# Patient Record
Sex: Male | Born: 1945
Health system: Southern US, Community
[De-identification: ages and names within clinical notes are randomized; demographics above are authoritative.]

## PROBLEM LIST (undated history)

## (undated) DIAGNOSIS — M1990 Unspecified osteoarthritis, unspecified site: Secondary | ICD-10-CM

## (undated) DIAGNOSIS — J189 Pneumonia, unspecified organism: Secondary | ICD-10-CM

## (undated) DIAGNOSIS — I10 Essential (primary) hypertension: Secondary | ICD-10-CM

## (undated) DIAGNOSIS — Z87442 Personal history of urinary calculi: Secondary | ICD-10-CM

## (undated) DIAGNOSIS — G4733 Obstructive sleep apnea (adult) (pediatric): Secondary | ICD-10-CM

## (undated) DIAGNOSIS — N433 Hydrocele, unspecified: Secondary | ICD-10-CM

## (undated) DIAGNOSIS — Z9989 Dependence on other enabling machines and devices: Secondary | ICD-10-CM

## (undated) DIAGNOSIS — R200 Anesthesia of skin: Secondary | ICD-10-CM

## (undated) DIAGNOSIS — E119 Type 2 diabetes mellitus without complications: Secondary | ICD-10-CM

## (undated) DIAGNOSIS — R202 Paresthesia of skin: Secondary | ICD-10-CM

## (undated) DIAGNOSIS — Z8709 Personal history of other diseases of the respiratory system: Secondary | ICD-10-CM

## (undated) DIAGNOSIS — F4310 Post-traumatic stress disorder, unspecified: Secondary | ICD-10-CM

## (undated) HISTORY — PX: CHOLECYSTECTOMY: SHX55

## (undated) HISTORY — PX: COLONOSCOPY: SHX174

## (undated) HISTORY — PX: TRIGGER FINGER RELEASE: SHX641

## (undated) HISTORY — PX: UPPER GI ENDOSCOPY: SHX6162

## (undated) HISTORY — PX: CERVICAL FUSION: SHX112

## (undated) HISTORY — PX: SHOULDER SURGERY: SHX246

## (undated) HISTORY — PX: EXTRACORPOREAL SHOCK WAVE LITHOTRIPSY: SHX1557

## (undated) HISTORY — PX: TONSILLECTOMY: SUR1361

## (undated) HISTORY — PX: OTHER SURGICAL HISTORY: SHX169

---

## 1989-05-20 HISTORY — PX: CHOLECYSTECTOMY: SHX55

## 1998-03-09 ENCOUNTER — Emergency Department (HOSPITAL_COMMUNITY): Admission: EM | Admit: 1998-03-09 | Discharge: 1998-03-09 | Payer: Self-pay | Admitting: Emergency Medicine

## 1998-05-29 ENCOUNTER — Inpatient Hospital Stay (HOSPITAL_COMMUNITY): Admission: RE | Admit: 1998-05-29 | Discharge: 1998-06-03 | Payer: Self-pay | Admitting: Urology

## 1998-05-29 ENCOUNTER — Encounter: Payer: Self-pay | Admitting: Urology

## 1998-06-01 ENCOUNTER — Encounter: Payer: Self-pay | Admitting: Urology

## 1998-06-02 ENCOUNTER — Encounter: Payer: Self-pay | Admitting: Urology

## 1998-06-08 ENCOUNTER — Encounter: Payer: Self-pay | Admitting: Urology

## 1998-06-08 ENCOUNTER — Inpatient Hospital Stay (HOSPITAL_COMMUNITY): Admission: EM | Admit: 1998-06-08 | Discharge: 1998-06-16 | Payer: Self-pay | Admitting: Emergency Medicine

## 1998-06-09 ENCOUNTER — Encounter: Payer: Self-pay | Admitting: Urology

## 1998-06-09 ENCOUNTER — Encounter: Payer: Self-pay | Admitting: Pulmonary Disease

## 1998-06-10 ENCOUNTER — Encounter: Payer: Self-pay | Admitting: Thoracic Surgery

## 1998-06-10 ENCOUNTER — Encounter: Payer: Self-pay | Admitting: Urology

## 1998-06-11 ENCOUNTER — Encounter: Payer: Self-pay | Admitting: Thoracic Surgery

## 1998-06-12 ENCOUNTER — Encounter: Payer: Self-pay | Admitting: Pulmonary Disease

## 1998-06-13 ENCOUNTER — Encounter: Payer: Self-pay | Admitting: Thoracic Surgery

## 1998-06-14 ENCOUNTER — Encounter: Payer: Self-pay | Admitting: Urology

## 1998-06-15 ENCOUNTER — Encounter: Payer: Self-pay | Admitting: Thoracic Surgery

## 1999-07-15 ENCOUNTER — Encounter: Payer: Self-pay | Admitting: Urology

## 1999-07-15 ENCOUNTER — Ambulatory Visit (HOSPITAL_COMMUNITY): Admission: RE | Admit: 1999-07-15 | Discharge: 1999-07-15 | Payer: Self-pay | Admitting: Urology

## 1999-08-16 ENCOUNTER — Encounter: Admission: RE | Admit: 1999-08-16 | Discharge: 1999-08-16 | Payer: Self-pay | Admitting: Urology

## 1999-08-16 ENCOUNTER — Encounter: Payer: Self-pay | Admitting: Urology

## 2000-04-04 ENCOUNTER — Encounter: Payer: Self-pay | Admitting: Urology

## 2000-04-04 ENCOUNTER — Encounter: Admission: RE | Admit: 2000-04-04 | Discharge: 2000-04-04 | Payer: Self-pay | Admitting: Urology

## 2000-09-19 HISTORY — PX: NEPHROLITHOTOMY: SHX5134

## 2000-10-09 ENCOUNTER — Encounter: Payer: Self-pay | Admitting: Family Medicine

## 2000-10-09 ENCOUNTER — Encounter: Admission: RE | Admit: 2000-10-09 | Discharge: 2000-10-09 | Payer: Self-pay | Admitting: Family Medicine

## 2001-04-30 ENCOUNTER — Encounter: Payer: Self-pay | Admitting: Urology

## 2001-04-30 ENCOUNTER — Encounter: Admission: RE | Admit: 2001-04-30 | Discharge: 2001-04-30 | Payer: Self-pay | Admitting: Urology

## 2002-06-12 ENCOUNTER — Encounter: Payer: Self-pay | Admitting: Neurosurgery

## 2002-06-17 ENCOUNTER — Encounter: Payer: Self-pay | Admitting: Neurosurgery

## 2002-06-17 ENCOUNTER — Ambulatory Visit (HOSPITAL_COMMUNITY): Admission: RE | Admit: 2002-06-17 | Discharge: 2002-06-18 | Payer: Self-pay | Admitting: Neurosurgery

## 2002-07-09 ENCOUNTER — Encounter: Payer: Self-pay | Admitting: Neurosurgery

## 2002-07-09 ENCOUNTER — Encounter: Admission: RE | Admit: 2002-07-09 | Discharge: 2002-07-09 | Payer: Self-pay | Admitting: Neurosurgery

## 2003-05-17 ENCOUNTER — Emergency Department (HOSPITAL_COMMUNITY): Admission: AD | Admit: 2003-05-17 | Discharge: 2003-05-17 | Payer: Self-pay | Admitting: Emergency Medicine

## 2003-05-18 ENCOUNTER — Encounter: Payer: Self-pay | Admitting: Emergency Medicine

## 2004-08-04 ENCOUNTER — Encounter: Admission: RE | Admit: 2004-08-04 | Discharge: 2004-08-04 | Payer: Self-pay | Admitting: Family Medicine

## 2004-08-25 ENCOUNTER — Encounter: Admission: RE | Admit: 2004-08-25 | Discharge: 2004-08-25 | Payer: Self-pay | Admitting: Family Medicine

## 2004-10-13 ENCOUNTER — Encounter: Admission: RE | Admit: 2004-10-13 | Discharge: 2004-10-13 | Payer: Self-pay | Admitting: Family Medicine

## 2005-07-07 ENCOUNTER — Encounter: Admission: RE | Admit: 2005-07-07 | Discharge: 2005-07-07 | Payer: Self-pay | Admitting: Family Medicine

## 2005-08-09 ENCOUNTER — Ambulatory Visit (HOSPITAL_BASED_OUTPATIENT_CLINIC_OR_DEPARTMENT_OTHER): Admission: RE | Admit: 2005-08-09 | Discharge: 2005-08-09 | Payer: Self-pay | Admitting: Family Medicine

## 2005-08-14 ENCOUNTER — Ambulatory Visit: Payer: Self-pay | Admitting: Internal Medicine

## 2005-09-13 ENCOUNTER — Ambulatory Visit (HOSPITAL_BASED_OUTPATIENT_CLINIC_OR_DEPARTMENT_OTHER): Admission: RE | Admit: 2005-09-13 | Discharge: 2005-09-13 | Payer: Self-pay | Admitting: Family Medicine

## 2005-09-18 ENCOUNTER — Ambulatory Visit: Payer: Self-pay | Admitting: Internal Medicine

## 2006-03-02 ENCOUNTER — Encounter: Admission: RE | Admit: 2006-03-02 | Discharge: 2006-03-02 | Payer: Self-pay | Admitting: Family Medicine

## 2006-03-21 ENCOUNTER — Encounter: Admission: RE | Admit: 2006-03-21 | Discharge: 2006-03-21 | Payer: Self-pay | Admitting: Neurology

## 2007-05-17 ENCOUNTER — Encounter: Admission: RE | Admit: 2007-05-17 | Discharge: 2007-05-17 | Payer: Self-pay | Admitting: Orthopedic Surgery

## 2007-06-08 ENCOUNTER — Encounter: Admission: RE | Admit: 2007-06-08 | Discharge: 2007-06-08 | Payer: Self-pay | Admitting: Orthopedic Surgery

## 2007-06-29 ENCOUNTER — Encounter: Admission: RE | Admit: 2007-06-29 | Discharge: 2007-06-29 | Payer: Self-pay | Admitting: Orthopedic Surgery

## 2007-08-27 ENCOUNTER — Ambulatory Visit (HOSPITAL_COMMUNITY): Admission: RE | Admit: 2007-08-27 | Discharge: 2007-08-27 | Payer: Self-pay | Admitting: Urology

## 2008-06-12 ENCOUNTER — Encounter: Admission: RE | Admit: 2008-06-12 | Discharge: 2008-06-12 | Payer: Self-pay | Admitting: Neurosurgery

## 2009-02-12 ENCOUNTER — Encounter: Admission: RE | Admit: 2009-02-12 | Discharge: 2009-02-12 | Payer: Self-pay | Admitting: Family Medicine

## 2010-09-03 ENCOUNTER — Encounter
Admission: RE | Admit: 2010-09-03 | Discharge: 2010-09-03 | Payer: Self-pay | Source: Home / Self Care | Attending: Orthopedic Surgery | Admitting: Orthopedic Surgery

## 2010-09-16 ENCOUNTER — Ambulatory Visit
Admission: RE | Admit: 2010-09-16 | Discharge: 2010-09-16 | Payer: Self-pay | Source: Home / Self Care | Attending: Orthopedic Surgery | Admitting: Orthopedic Surgery

## 2010-09-16 HISTORY — PX: SHOULDER ARTHROSCOPY: SHX128

## 2010-10-24 ENCOUNTER — Inpatient Hospital Stay (INDEPENDENT_AMBULATORY_CARE_PROVIDER_SITE_OTHER)
Admission: RE | Admit: 2010-10-24 | Discharge: 2010-10-24 | Disposition: A | Discharge status: Home / Self Care | Payer: BC Managed Care – PPO | Source: Ambulatory Visit | Attending: Family Medicine | Admitting: Family Medicine

## 2010-10-24 DIAGNOSIS — M25519 Pain in unspecified shoulder: Secondary | ICD-10-CM

## 2010-11-29 LAB — BASIC METABOLIC PANEL
BUN: 12 mg/dL (ref 6–23)
Chloride: 103 mEq/L (ref 96–112)
Creatinine, Ser: 1.2 mg/dL (ref 0.4–1.5)
GFR calc Af Amer: >60 mL/min (ref >60)
Glucose, Bld: 91 mg/dL (ref 70–99)
Potassium: 4.8 mEq/L (ref 3.5–5.1)
Sodium: 141 mEq/L (ref 135–145)

## 2010-11-29 LAB — POCT HEMOGLOBIN-HEMACUE: Hemoglobin: 15.2 g/dL (ref 13.0–17.0)

## 2011-02-04 NOTE — Op Note (Signed)
NAME:  MAXXWELL, EDGETT                        ACCOUNT NO.:  [id number]   MEDICAL RECORD NO.:  [id number]                   PATIENT TYPE:  OIB   LOCATION:  NA                                   FACILITY:  [facility]   PHYSICIAN:  Reinaldo Meeker, M.D.              DATE OF BIRTH:  07-Dec-1945   DATE OF PROCEDURE:  06/17/2002  DATE OF DISCHARGE:                                 OPERATIVE REPORT   PREOPERATIVE DIAGNOSIS:  Herniated disk at C5-6 level.   POSTOPERATIVE DIAGNOSIS:  Herniated disk at C5-6 level.   PROCEDURE:  C5-6 anterior cervical diskectomy with bone bank fusion,  followed by Zephyr anterior cervical plating, with the operating microscope.   SURGEON:  Reinaldo Meeker, M.D.   ASSISTANT:  Kathaleen Maser. Pool, M.D.   DESCRIPTION OF PROCEDURE:  After being placed in the supine position in 10  pounds of Holter traction, the patient's neck was prepped and draped in the  usual sterile fashion.  A localizing x-ray was taken prior to incision to  identify the appropriate level.  A transverse incision was made in the right  anterior neck starting at the midline and headed toward the medial aspect of  the sternocleidomastoid muscle.  The platysma muscle was then incised  transversely.  The natural fascial plane between the strap muscles medially  and the sternocleidomastoid laterally was identified and followed down to  the anterior aspect of the cervical spine.  The longus colli muscles were  identified and split in the midline and stripped away bilaterally with the  Optician, dispensing.  A self-retaining retractor was placed  for exposure.  A second x-ray showed approach to the C5-6 level.  Using a 15  blade, the annulus of the disk was incised.  Using pituitary rongeurs and  curettes, approximately 90% of the disk material was removed.  A high-speed  drill was used to decorticate the edges and widen the disk space.  At this  point the microscope was draped and brought  into the field and used for the  remainder of the case.  Using microdissection technique, the remainder of  the disk material down to the posterior longitudinal ligament was removed.  The ligament was then incised transversely and the cut edge removed with the  Kerrison punch.  Inspection of the left C5-6 foramen yielded a large amount  of herniated disk material pressing on the spinal dura and proximal nerve  root, and this was removed.  This gave excellent decompression of the  underlying C6 nerve root.  At this point inspection was carried out  bilaterally to complete the decompression, and no further compression could  be identified at that time.  Large amounts of irrigation were carried out  and any bleeders controlled with bipolar coagulation and Gelfoam.  Measurements were taken and a 7 mm bone bank plug was reconstituted.  After  irrigating once more, the  7 mm plug was impacted and fluoroscopy showed it  to be in good position.  The appropriate-length Zephyr anterior cervical  plate was then chosen.  Under fluoroscopic guidance drill holes placed,  followed by placement of 15 mm screws x4.  Large amounts of irrigation were  carried out at this time and any bleeding controlled with bipolar  coagulation.  The wound was then closed using interrupted Vicryl on the  platysma muscle, inverted 5-0 PDS on the subcuticular layer, and Steri-  Strips on the skin.  A sterile dressing was then applied and the patient was  extubated and taken to the recovery room in stable condition.                                               Reinaldo Meeker, M.D.   ROK/MEDQ  D:  06/17/2002  T:  06/18/2002  Job:  [id number]

## 2011-02-04 NOTE — Procedures (Signed)
NAME:  Seth Lewis, Seth Lewis NO.:  [id number]   MEDICAL RECORD NO.:  [id number]          PATIENT TYPE:  OUT   LOCATION:  SLEEP CENTER                 FACILITY:  [facility]   PHYSICIAN:  Clinton D. Maple Hudson, M.D. DATE OF BIRTH:  1946/03/13   DATE OF STUDY:                              NOCTURNAL POLYSOMNOGRAM   REFERRING PHYSICIAN:  Dr. Blair Heys.   DATE OF STUDY:  September 13, 2005.   INDICATION FOR STUDY:  Hypersomnia with sleep apnea.   EPWORTH SLEEPINESS SCORE:  9/24.   BMI:  29.   WEIGHT:  225 pounds.   HOME MEDICATIONS:  Toprol XL, simvastatin, Nifedical, aspirin, temazepam.   SLEEP ARCHITECTURE:  Total sleep time 351 minutes with sleep efficiency 97%.  Stage I 3%, stage II 58%, stages III and IV 13%, REM 26% of total sleep  time. Sleep latency 8 minutes, REM latency 58 minutes, awake after sleep  onset 4.5 minutes, arousal index 21.7. An unspecified sleeping pill was  taken before arrival.   RESPIRATORY DATA:  C-PAP titration protocol:  C-PAP was successfully  titrated to 8 CWP, AHI 1.1 per hour. A ResMed Swift was used with medium  nasal pillows, and a heated humidifier.   OXYGEN DATA:  Loud snoring before C-PAP control was prevented by C-PAP. An  oxygen saturation on C-PAP was held 95-98% on room air.   CARDIAC DATA:  Normal sinus rhythm.   MOVEMENT/PARASOMNIA:  A total of 68 limb jerks were reported of which 28  were associated with arousal or awakening for periodic limb movement with  arousal index of 4.8 per hour which is mildly increased.   IMPRESSION/RECOMMENDATIONS:  1.  Successful C-PAP titration to 8 CWP, AHI 1.1 per hour. A sleeping pill      had been taken. A ResMed Swift with medium nasal pillows was used with      heated humidifier.  2.  Baseline diagnostic NPSG on August 09, 2005 had reported an AHI of      25.6 per hour.  3.  Periodic limb movement with arousal, 4.8 per hour which can be      reconsidered clinically after adjustment  to C-PAP.      Clinton D. Maple Hudson, M.D.  Diplomate, Biomedical engineer of Sleep Medicine  Electronically Signed     CDY/MEDQ  D:  09/18/2005 09:24:04  T:  09/18/2005 15:47:23  Job:  [id number]

## 2011-02-04 NOTE — Procedures (Signed)
NAME:  Seth Lewis, VICTORY NO.:  [id number]   MEDICAL RECORD NO.:  [id number]          PATIENT TYPE:  OUT   LOCATION:  SLEEP CENTER                 FACILITY:  [facility]   PHYSICIAN:  Clinton D. Maple Hudson, M.D. DATE OF BIRTH:  May 24, 1946   DATE OF STUDY:  08/09/2005                              NOCTURNAL POLYSOMNOGRAM   REFERRING PHYSICIAN:  Dr. Blair Heys.   DATE OF STUDY:  August 09, 2005.   INDICATION FOR STUDY:  Hypersomnia with sleep apnea.   EPWORTH SLEEPINESS SCORE:  7/24.   BMI:  29.   WEIGHT:  225 pounds.   SLEEP ARCHITECTURE:  Total sleep time 239 minutes with sleep efficiency 59%.  Sleep onset was at 1:24 a.m. Stage I was 8%, stage II 59%, stages III and IV  9%, REM 24% of total sleep time. Sleep latency 132 minutes, REM latency 110  minutes, awake after sleep onset 37 minutes, arousal index 37.3. No bedtime  medication was taken. The patient indicated that he was bothered by the  monitoring leads.   RESPIRATORY DATA:  Apnea/hypopnea index (AHI, RDI) 25.6 obstructive events  per hour indicating moderate obstructive sleep apnea/hypopnea syndrome.  There were 32 obstructive apneas and 70 hypopneas. Events were more common  while supine but present also while sleeping on right side. REM AHI 51 per  hour. Sleep onset was too late to permit use of C-PAP titration by split  protocol.   OXYGEN DATA:  Very loud snoring with oxygen desaturation to a nadir of 79%.  Mean oxygen saturation through the study was 93% on room air.   CARDIAC DATA:  Normal sinus rhythm.   MOVEMENT/PARASOMNIA:  A total of 44 limb jerks were reported of which 13  were associated with arousal or awakening for periodic limb movement with  arousal index of 3.3 per hour which is mildly increased.   IMPRESSION/RECOMMENDATIONS:  1.  Difficulty initiating and maintaining sleep on the study night.  2.  Moderate obstructive sleep apnea/hypopnea syndrome, AHI 25.6 per hour      with loud  snoring and oxygen desaturation to 79%. Events were more      common while supine and during REM.  3.  Consider return for C-PAP titration bringing a sleep medication to      assist with sleep onset and maintenance.  4.  Mild periodic limb movement with arousal, 3.3 per hour.      Clinton D. Maple Hudson, M.D.  Diplomate, Biomedical engineer of Sleep Medicine  Electronically Signed     CDY/MEDQ  D:  08/14/2005 11:24:47  T:  08/14/2005 23:37:36  Job:  [id number]

## 2011-08-18 ENCOUNTER — Other Ambulatory Visit: Payer: Self-pay | Admitting: Family Medicine

## 2011-08-18 DIAGNOSIS — M949 Disorder of cartilage, unspecified: Secondary | ICD-10-CM

## 2011-08-18 DIAGNOSIS — M899 Disorder of bone, unspecified: Secondary | ICD-10-CM

## 2011-08-19 ENCOUNTER — Ambulatory Visit
Admission: RE | Admit: 2011-08-19 | Discharge: 2011-08-19 | Disposition: A | Discharge status: Home / Self Care | Payer: BC Managed Care – PPO | Source: Ambulatory Visit | Attending: Family Medicine | Admitting: Family Medicine

## 2011-08-19 DIAGNOSIS — M899 Disorder of bone, unspecified: Secondary | ICD-10-CM

## 2012-09-28 ENCOUNTER — Other Ambulatory Visit: Payer: Self-pay | Admitting: Neurosurgery

## 2012-09-28 DIAGNOSIS — M542 Cervicalgia: Secondary | ICD-10-CM

## 2012-10-08 ENCOUNTER — Ambulatory Visit
Admission: RE | Admit: 2012-10-08 | Discharge: 2012-10-08 | Disposition: A | Discharge status: Home / Self Care | Payer: BC Managed Care – PPO | Source: Ambulatory Visit | Attending: Neurosurgery | Admitting: Neurosurgery

## 2012-10-08 VITALS — BP 119/69 | HR 62 | Ht 73.0 in | Wt 223.0 lb

## 2012-10-08 DIAGNOSIS — M542 Cervicalgia: Secondary | ICD-10-CM

## 2012-10-08 MED ORDER — DIAZEPAM 5 MG PO TABS
5.0000 mg | ORAL_TABLET | Freq: Once | ORAL | Status: AC
  Administered 2012-10-08: 5 mg via ORAL

## 2012-10-08 MED ORDER — IOHEXOL 300 MG/ML  SOLN
10.0000 mL | Freq: Once | INTRAMUSCULAR | Status: AC | PRN
  Administered 2012-10-08: 10 mL via INTRATHECAL

## 2012-10-08 MED ORDER — MEPERIDINE HCL 100 MG/ML IJ SOLN
75.0000 mg | Freq: Once | INTRAMUSCULAR | Status: AC
  Administered 2012-10-08: 75 mg via INTRAMUSCULAR

## 2012-10-08 MED ORDER — ONDANSETRON HCL 4 MG/2ML IJ SOLN
4.0000 mg | Freq: Once | INTRAMUSCULAR | Status: AC
  Administered 2012-10-08: 4 mg via INTRAMUSCULAR

## 2012-10-10 ENCOUNTER — Telehealth: Payer: Self-pay | Admitting: Radiology

## 2012-10-10 NOTE — Telephone Encounter (Signed)
Pt called because low back is sore and uncomfortable after myelo, on Monday. Explained, he had been stuck with a needle and going through all that muscle could make it sore. Also explained that sometimes just the fact of being on bedrest for 24-48 hours could definitely make him more stiff and sore.

## 2013-01-27 ENCOUNTER — Emergency Department (INDEPENDENT_AMBULATORY_CARE_PROVIDER_SITE_OTHER)
Admission: EM | Admit: 2013-01-27 | Discharge: 2013-01-27 | Disposition: A | Discharge status: Home / Self Care | Payer: BC Managed Care – PPO | Source: Home / Self Care | Attending: Emergency Medicine | Admitting: Emergency Medicine

## 2013-01-27 ENCOUNTER — Emergency Department (INDEPENDENT_AMBULATORY_CARE_PROVIDER_SITE_OTHER): Payer: BC Managed Care – PPO

## 2013-01-27 ENCOUNTER — Encounter (HOSPITAL_COMMUNITY): Payer: Self-pay | Admitting: *Deleted

## 2013-01-27 DIAGNOSIS — J209 Acute bronchitis, unspecified: Secondary | ICD-10-CM

## 2013-01-27 MED ORDER — CEFTRIAXONE SODIUM 1 G IJ SOLR
1.0000 g | Freq: Once | INTRAMUSCULAR | Status: AC
  Administered 2013-01-27: 1 g via INTRAMUSCULAR

## 2013-01-27 MED ORDER — GUAIFENESIN-CODEINE 100-10 MG/5ML PO SYRP: 10.0000 mL | ORAL_SOLUTION | Freq: Four times a day (QID) | ORAL | Status: DC | PRN

## 2013-01-27 MED ORDER — LIDOCAINE HCL (PF) 1 % IJ SOLN
INTRAMUSCULAR | Status: AC
  Filled 2013-01-27: qty 5

## 2013-01-27 MED ORDER — ALBUTEROL SULFATE HFA 108 (90 BASE) MCG/ACT IN AERS: 2.0000 | INHALATION_SPRAY | RESPIRATORY_TRACT | Status: DC | PRN

## 2013-01-27 MED ORDER — LEVOFLOXACIN 500 MG PO TABS: 500.0000 mg | ORAL_TABLET | Freq: Every day | ORAL | Status: DC

## 2013-01-27 MED ORDER — CEFTRIAXONE SODIUM 1 G IJ SOLR
INTRAMUSCULAR | Status: AC
  Filled 2013-01-27: qty 10

## 2013-01-27 NOTE — ED Provider Notes (Signed)
History     CSN: [id number]  Arrival date & time 01/27/13  1101   First MD Initiated Contact with Patient 01/27/13 1109      Chief Complaint  Patient presents with  . URI    (Consider location/radiation/quality/duration/timing/severity/associated sxs/prior treatment) Patient is a 67 y.o. male presenting with URI. The history is provided by the patient. No language interpreter was used.  URI Presenting symptoms: congestion, cough and fever   Severity:  Moderate Onset quality:  Gradual Duration:  1 week Timing:  Intermittent Progression:  Waxing and waning Chronicity:  New Relieved by:  Nothing Worsened by:  Breathing Ineffective treatments:  None tried Associated symptoms: myalgias and swollen glands   Risk factors: being elderly     History reviewed. No pertinent past medical history.  History reviewed. No pertinent past surgical history.  No family history on file.  History  Substance Use Topics  . Smoking status: Former Smoker -- 0.30 packs/day for 12 years    Types: Cigarettes    Quit date: 10/08/1982  . Smokeless tobacco: Never Used  . Alcohol Use: Yes     Comment: occasionally      Review of Systems  Constitutional: Positive for fever.  HENT: Positive for congestion.   Eyes: Negative.   Respiratory: Positive for cough.   Cardiovascular: Negative.   Gastrointestinal: Negative.   Endocrine: Negative.   Genitourinary: Negative.   Musculoskeletal: Positive for myalgias.  Neurological: Negative.   Hematological: Negative.   Psychiatric/Behavioral: Negative.   All other systems reviewed and are negative.    Allergies  Review of patient's allergies indicates no known allergies.  Home Medications   Current Outpatient Rx  Name  Route  Sig  Dispense  Refill  . aspirin 81 MG tablet   Oral   Take 81 mg by mouth daily.         . ergocalciferol (VITAMIN D2) 50000 UNITS capsule   Oral   Take 50,000 Units by mouth once a week.         .  metoprolol succinate (TOPROL-XL) 50 MG 24 hr tablet   Oral   Take 50 mg by mouth daily. Take with or immediately following a meal.         . NIFEdipine (PROCARDIA XL/ADALAT-CC) 60 MG 24 hr tablet   Oral   Take 60 mg by mouth daily.         . simvastatin (ZOCOR) 40 MG tablet   Oral   Take 40 mg by mouth every evening.         . testosterone (ANDROGEL) 50 MG/5GM GEL   Transdermal   Place 5 g onto the skin daily.         Marland Kitchen albuterol (PROVENTIL HFA;VENTOLIN HFA) 108 (90 BASE) MCG/ACT inhaler   Inhalation   Inhale 2 puffs into the lungs every 4 (four) hours as needed for wheezing.   1 Inhaler   0   . guaiFENesin-codeine (ROBITUSSIN AC) 100-10 MG/5ML syrup   Oral   Take 10 mLs by mouth 4 (four) times daily as needed for cough.   150 mL   0   . levofloxacin (LEVAQUIN) 500 MG tablet   Oral   Take 1 tablet (500 mg total) by mouth daily.   7 tablet   0     BP 126/73  Pulse 69  Temp(Src) 99.7 F (37.6 C) (Oral)  Resp 16  SpO2 96%  Physical Exam  Nursing note and vitals reviewed. Constitutional: He is oriented to  person, place, and time. He appears well-developed and well-nourished. No distress.  HENT:  Head: Normocephalic and atraumatic.  Throat hyperemic, no exudate  Eyes: Conjunctivae are normal. Pupils are equal, round, and reactive to light.  Neck: Normal range of motion. Neck supple.  Has anterior cervical adenopathy  Cardiovascular: Normal rate, regular rhythm, normal heart sounds and intact distal pulses.   No murmur heard. Pulmonary/Chest: Effort normal and breath sounds normal. No respiratory distress. He has no wheezes. He has no rales. He exhibits no tenderness.  Abdominal: Soft. Bowel sounds are normal. He exhibits no distension and no mass. There is no tenderness.  Musculoskeletal: Normal range of motion.  Lymphadenopathy:    He has cervical adenopathy.  Neurological: He is alert and oriented to person, place, and time. No cranial nerve deficit. He  exhibits normal muscle tone. Coordination normal.  Skin: Skin is warm and dry.  Psychiatric: He has a normal mood and affect.    ED Course  Procedures (including critical care time)  Labs Reviewed - No data to display Dg Chest 2 View  01/27/2013  *RADIOLOGY REPORT*  Clinical Data: Cough and fever  CHEST - 2 VIEW  Comparison: 10/13/2004  Findings: Cervical fusion hardware partly visualized.  Heart size is normal. Aorta is ectatic and unfolded.  Curvilinear left lower lobe probable scarring or atelectasis.  Right pleural fluid or thickening noted at the costophrenic angle, unchanged. Flattening of hemidiaphragms again noted suggesting emphysema.  IMPRESSION: Emphysematous changes again noted, no acute finding.   Original Report Authenticated By: Christiana Pellant, M.D.      1. Acute bronchitis       MDM         Jani Files, MD 01/28/13 [phone]

## 2013-01-27 NOTE — ED Notes (Signed)
Patient complains of coughing, fever, fatigue x 1 week. Shortness of breath and can not breath when he lies down. Also, diarrhea, nausea and vomiting.

## 2015-06-23 DIAGNOSIS — M25511 Pain in right shoulder: Secondary | ICD-10-CM | POA: Diagnosis not present

## 2015-06-29 DIAGNOSIS — M19011 Primary osteoarthritis, right shoulder: Secondary | ICD-10-CM | POA: Diagnosis not present

## 2015-06-29 DIAGNOSIS — M24111 Other articular cartilage disorders, right shoulder: Secondary | ICD-10-CM | POA: Diagnosis not present

## 2015-07-15 DIAGNOSIS — M19011 Primary osteoarthritis, right shoulder: Secondary | ICD-10-CM | POA: Diagnosis not present

## 2015-07-15 DIAGNOSIS — M6281 Muscle weakness (generalized): Secondary | ICD-10-CM | POA: Diagnosis not present

## 2015-07-15 DIAGNOSIS — M25511 Pain in right shoulder: Secondary | ICD-10-CM | POA: Diagnosis not present

## 2015-07-15 DIAGNOSIS — Z96611 Presence of right artificial shoulder joint: Secondary | ICD-10-CM | POA: Diagnosis not present

## 2015-07-20 DIAGNOSIS — M25511 Pain in right shoulder: Secondary | ICD-10-CM | POA: Diagnosis not present

## 2015-07-20 DIAGNOSIS — M19011 Primary osteoarthritis, right shoulder: Secondary | ICD-10-CM | POA: Diagnosis not present

## 2015-07-20 DIAGNOSIS — Z96611 Presence of right artificial shoulder joint: Secondary | ICD-10-CM | POA: Diagnosis not present

## 2015-07-20 DIAGNOSIS — M6281 Muscle weakness (generalized): Secondary | ICD-10-CM | POA: Diagnosis not present

## 2015-07-21 DIAGNOSIS — Z96611 Presence of right artificial shoulder joint: Secondary | ICD-10-CM | POA: Diagnosis not present

## 2015-07-21 DIAGNOSIS — M19011 Primary osteoarthritis, right shoulder: Secondary | ICD-10-CM | POA: Diagnosis not present

## 2015-07-21 DIAGNOSIS — M25511 Pain in right shoulder: Secondary | ICD-10-CM | POA: Diagnosis not present

## 2015-07-21 DIAGNOSIS — M6281 Muscle weakness (generalized): Secondary | ICD-10-CM | POA: Diagnosis not present

## 2015-07-22 DIAGNOSIS — M6281 Muscle weakness (generalized): Secondary | ICD-10-CM | POA: Diagnosis not present

## 2015-07-22 DIAGNOSIS — M19011 Primary osteoarthritis, right shoulder: Secondary | ICD-10-CM | POA: Diagnosis not present

## 2015-07-22 DIAGNOSIS — M25511 Pain in right shoulder: Secondary | ICD-10-CM | POA: Diagnosis not present

## 2015-07-22 DIAGNOSIS — Z96611 Presence of right artificial shoulder joint: Secondary | ICD-10-CM | POA: Diagnosis not present

## 2015-09-12 ENCOUNTER — Emergency Department (HOSPITAL_COMMUNITY): Payer: BLUE CROSS/BLUE SHIELD

## 2015-09-12 ENCOUNTER — Emergency Department (HOSPITAL_COMMUNITY)
Admission: EM | Admit: 2015-09-12 | Discharge: 2015-09-12 | Disposition: A | Discharge status: Home / Self Care | Payer: BLUE CROSS/BLUE SHIELD | Attending: Emergency Medicine | Admitting: Emergency Medicine

## 2015-09-12 ENCOUNTER — Encounter (HOSPITAL_COMMUNITY): Payer: Self-pay | Admitting: Emergency Medicine

## 2015-09-12 DIAGNOSIS — Z87891 Personal history of nicotine dependence: Secondary | ICD-10-CM | POA: Insufficient documentation

## 2015-09-12 DIAGNOSIS — Z792 Long term (current) use of antibiotics: Secondary | ICD-10-CM | POA: Insufficient documentation

## 2015-09-12 DIAGNOSIS — R1032 Left lower quadrant pain: Secondary | ICD-10-CM | POA: Diagnosis not present

## 2015-09-12 DIAGNOSIS — Z8669 Personal history of other diseases of the nervous system and sense organs: Secondary | ICD-10-CM | POA: Diagnosis not present

## 2015-09-12 DIAGNOSIS — K5900 Constipation, unspecified: Secondary | ICD-10-CM | POA: Diagnosis not present

## 2015-09-12 DIAGNOSIS — Z79899 Other long term (current) drug therapy: Secondary | ICD-10-CM | POA: Diagnosis not present

## 2015-09-12 DIAGNOSIS — R1031 Right lower quadrant pain: Secondary | ICD-10-CM | POA: Diagnosis not present

## 2015-09-12 DIAGNOSIS — E119 Type 2 diabetes mellitus without complications: Secondary | ICD-10-CM | POA: Diagnosis not present

## 2015-09-12 DIAGNOSIS — Z7982 Long term (current) use of aspirin: Secondary | ICD-10-CM | POA: Insufficient documentation

## 2015-09-12 DIAGNOSIS — Z7984 Long term (current) use of oral hypoglycemic drugs: Secondary | ICD-10-CM | POA: Diagnosis not present

## 2015-09-12 DIAGNOSIS — N200 Calculus of kidney: Secondary | ICD-10-CM | POA: Diagnosis not present

## 2015-09-12 DIAGNOSIS — R1030 Lower abdominal pain, unspecified: Secondary | ICD-10-CM

## 2015-09-12 LAB — COMPREHENSIVE METABOLIC PANEL
ALK PHOS: 99 U/L (ref 38–126)
ALT: 27 U/L (ref 17–63)
ANION GAP: 13 (ref 5–15)
AST: 37 U/L (ref 15–41)
Albumin: 3.3 g/dL — ABNORMAL LOW (ref 3.5–5.0)
BILIRUBIN TOTAL: 2.1 mg/dL — AB (ref 0.3–1.2)
BUN: 15 mg/dL (ref 6–20)
CALCIUM: 8.6 mg/dL — AB (ref 8.9–10.3)
CO2: 19 mmol/L — ABNORMAL LOW (ref 22–32)
CREATININE: 1.4 mg/dL — AB (ref 0.61–1.24)
Chloride: 103 mmol/L (ref 101–111)
GFR, EST AFRICAN AMERICAN: 57 mL/min — AB (ref >60)
GFR, EST NON AFRICAN AMERICAN: 49 mL/min — AB (ref >60)
Glucose, Bld: 145 mg/dL — ABNORMAL HIGH (ref 65–99)
Potassium: 4.7 mmol/L (ref 3.5–5.1)
SODIUM: 135 mmol/L (ref 135–145)
TOTAL PROTEIN: 7.8 g/dL (ref 6.5–8.1)

## 2015-09-12 LAB — URINE MICROSCOPIC-ADD ON

## 2015-09-12 LAB — CBC
HCT: 37.1 % — ABNORMAL LOW (ref 39.0–52.0)
HEMOGLOBIN: 12.1 g/dL — AB (ref 13.0–17.0)
MCH: 29.6 pg (ref 26.0–34.0)
MCHC: 32.6 g/dL (ref 30.0–36.0)
MCV: 90.7 fL (ref 78.0–100.0)
PLATELETS: 367 10*3/uL (ref 150–400)
RBC: 4.09 MIL/uL — AB (ref 4.22–5.81)
RDW: 14.1 % (ref 11.5–15.5)
WBC: 12.5 10*3/uL — AB (ref 4.0–10.5)

## 2015-09-12 LAB — URINALYSIS, ROUTINE W REFLEX MICROSCOPIC
Bilirubin Urine: NEGATIVE
Glucose, UA: NEGATIVE mg/dL
Ketones, ur: NEGATIVE mg/dL
NITRITE: NEGATIVE
PROTEIN: NEGATIVE mg/dL
SPECIFIC GRAVITY, URINE: 1.011 (ref 1.005–1.030)
pH: 6 (ref 5.0–8.0)

## 2015-09-12 LAB — LIPASE, BLOOD: Lipase: 35 U/L (ref 11–51)

## 2015-09-12 LAB — CBG MONITORING, ED: GLUCOSE-CAPILLARY: 141 mg/dL — AB (ref 65–99)

## 2015-09-12 MED ORDER — SODIUM CHLORIDE 0.9 % IV BOLUS (SEPSIS)
1000.0000 mL | Freq: Once | INTRAVENOUS | Status: AC
  Administered 2015-09-12: 1000 mL via INTRAVENOUS

## 2015-09-12 MED ORDER — POLYETHYLENE GLYCOL 3350 17 GM/SCOOP PO POWD: 17.0000 g | Freq: Two times a day (BID) | ORAL | Status: DC

## 2015-09-12 MED ORDER — OMEPRAZOLE 20 MG PO CPDR
20.0000 mg | DELAYED_RELEASE_CAPSULE | Freq: Every day | ORAL | Status: DC
Start: 2015-09-12 — End: 2015-12-11

## 2015-09-12 MED ORDER — TAMSULOSIN HCL 0.4 MG PO CAPS: 0.4000 mg | ORAL_CAPSULE | Freq: Every day | ORAL | Status: DC

## 2015-09-12 MED ORDER — MORPHINE SULFATE (PF) 4 MG/ML IV SOLN
4.0000 mg | Freq: Once | INTRAVENOUS | Status: AC
  Administered 2015-09-12: 4 mg via INTRAVENOUS
  Filled 2015-09-12: qty 1

## 2015-09-12 MED ORDER — IOHEXOL 300 MG/ML  SOLN
80.0000 mL | Freq: Once | INTRAMUSCULAR | Status: AC | PRN
  Administered 2015-09-12: 15:00:00 via INTRAVENOUS

## 2015-09-12 MED ORDER — OXYCODONE-ACETAMINOPHEN 5-325 MG PO TABS: 1.0000 | ORAL_TABLET | Freq: Four times a day (QID) | ORAL | Status: DC | PRN

## 2015-09-12 MED ORDER — SODIUM CHLORIDE 0.9 % IV BOLUS (SEPSIS)
500.0000 mL | Freq: Once | INTRAVENOUS | Status: AC
  Administered 2015-09-12: 500 mL via INTRAVENOUS

## 2015-09-12 MED ORDER — IOHEXOL 300 MG/ML  SOLN
25.0000 mL | Freq: Once | INTRAMUSCULAR | Status: AC | PRN
  Administered 2015-09-12: 25 mL via ORAL

## 2015-09-12 MED ORDER — ONDANSETRON 4 MG PO TBDP: 4.0000 mg | ORAL_TABLET | Freq: Three times a day (TID) | ORAL | Status: DC | PRN

## 2015-09-12 NOTE — ED Provider Notes (Signed)
[facility]. Please see her note for further. Patient in complaining of left lower quadrant abdominal pain with associated lots of burping and belching. At the time of shift change the patient is awaiting CT scan. Patient is followed by urologist Dr. Patsi Searsannenbaum. He was diagnosed with epididymitis 2 days ago and is currently taking Levaquin. Patient CT abdomen and pelvis with contrast indicated a left sided hydronephrosis and hydroureter secondary to a 5 mm left midureteral calculus. Patient's creatinine is mildly elevated at 1.40 here in the emergency department. His records from 5 years ago indicated a creatinine of 1.20. Urinalysis also shows moderate leukocytes with few bacteria. This is likely due to his epididymitis. He also has a mild leukocytosis with a white count of 12,000. I consulted with his urologist Dr. Patsi Searsannenbaum who also evaluated the patient. He feels the patient be discharged with protrusions for pain medicine and Flomax. He will follow-up with him this week. At discharge patient reports he is pain-free. Will discharge with prescriptions for Percocet, Zofran and Flomax. Patient is also requesting for acid reflux medicine. Will discharge her with omeprazole for his acid reflux and MiraLAX for his constipation. Encouraged a high-fiber diet and encouraged him to push fluids. I discussed return precautions with the patient. I advised the patient to follow-up with their primary care provider this week. I advised the patient to return to the emergency department with new or worsening symptoms or new concerns. The patient verbalized understanding and agreement with plan.    Results for orders placed or performed during the hospital encounter of 09/12/15  Lipase, blood  Result Value Ref Range   Lipase 35 11 - 51 U/L  Comprehensive metabolic panel  Result Value Ref Range   Sodium 135 135 - 145 mmol/L   Potassium 4.7 3.5 - 5.1  mmol/L   Chloride 103 101 - 111 mmol/L   CO2 19 (L) 22 - 32 mmol/L   Glucose, Bld 145 (H) 65 - 99 mg/dL   BUN 15 6 - 20 mg/dL   Creatinine, Ser 4.781.40 (H) 0.61 - 1.24 mg/dL   Calcium 8.6 (L) 8.9 - 10.3 mg/dL   Total Protein 7.8 6.5 - 8.1 g/dL   Albumin 3.3 (L) 3.5 - 5.0 g/dL   AST 37 15 - 41 U/L   ALT 27 17 - 63 U/L   Alkaline Phosphatase 99 38 - 126 U/L   Total Bilirubin 2.1 (H) 0.3 - 1.2 mg/dL   GFR calc non Af Amer 49 (L) >60 mL/min   GFR calc Af Amer 57 (L) >60 mL/min   Anion gap 13 5 - 15  CBC  Result Value Ref Range   WBC 12.5 (H) 4.0 - 10.5 K/uL   RBC 4.09 (L) 4.22 - 5.81 MIL/uL   Hemoglobin 12.1 (L) 13.0 - 17.0 g/dL   HCT 29.537.1 (L) 62.139.0 - 30.852.0 %   MCV 90.7 78.0 - 100.0 fL   MCH 29.6 26.0 - 34.0 pg   MCHC 32.6 30.0 - 36.0 g/dL   RDW 65.714.1 84.611.5 - 96.215.5 %   Platelets 367 150 - 400 K/uL  Urinalysis, Routine w reflex microscopic ([facility])  Result Value Ref Range   Color, Urine YELLOW YELLOW   APPearance CLOUDY (A) CLEAR   Specific Gravity, Urine 1.011 1.005 - 1.030   pH 6.0 5.0 - 8.0   Glucose, UA NEGATIVE NEGATIVE mg/dL   Hgb urine dipstick LARGE (A) NEGATIVE   Bilirubin  Urine NEGATIVE NEGATIVE   Ketones, ur NEGATIVE NEGATIVE mg/dL   Protein, ur NEGATIVE NEGATIVE mg/dL   Nitrite NEGATIVE NEGATIVE   Leukocytes, UA MODERATE (A) NEGATIVE  Urine microscopic-add on  Result Value Ref Range   Squamous Epithelial / LPF 0-5 (A) NONE SEEN   WBC, UA 6-30 0 - 5 WBC/hpf   RBC / HPF 0-5 0 - 5 RBC/hpf   Bacteria, UA FEW (A) NONE SEEN  POC CBG, ED  Result Value Ref Range   Glucose-Capillary 141 (H) 65 - 99 mg/dL   Ct Abdomen Pelvis W Contrast  09/12/2015  CLINICAL DATA:  Right testicle swelling. On Levaquin. Pain and vomiting. Constipation. EXAM: CT ABDOMEN AND PELVIS WITH CONTRAST TECHNIQUE: Multidetector CT imaging of the abdomen and pelvis was performed using the standard protocol following bolus administration of intravenous contrast. CONTRAST:  1 OMNIPAQUE IOHEXOL 300  MG/ML SOLN, 25mL OMNIPAQUE IOHEXOL 300 MG/ML SOLN COMPARISON:  07/25/2007 FINDINGS: Lower chest:  No pleural effusion.  The lung bases appear clear. Hepatobiliary: No focal liver abnormality identified. Mild hepatic steatosis noted. Previous cholecystectomy. Mild increase caliber of the CBD. Pancreas: Negative. Spleen: Negative. Adrenals/Urinary Tract: The adrenal glands are negative. There is scarring and volume loss from the inferior pole of the right kidney. Cyst within the inferior pole of right kidney noted. There is a large calculi involving the upper pole of the left kidney measuring 4 mm 1.4 cm. Left-sided hydronephrosis and perinephric fat stranding is identified. Within the mid left ureter there is a stone measuring 5 mm. The urinary bladder appears normal. Stomach/Bowel: The stomach is within normal limits. The small bowel loops have a normal course and caliber. No obstruction. Normal appearance of the colon. The appendix is visualized and appears normal. Multiple colonic diverticula noted. No acute inflammation. Vascular/Lymphatic: Calcified atherosclerotic disease involves the abdominal aorta. No aneurysm. No enlarged retroperitoneal or mesenteric adenopathy. No enlarged pelvic or inguinal lymph nodes. Reproductive: Prostate gland and seminal vesicles are unremarkable. Other: No free fluid or fluid collections. Musculoskeletal: Degenerative disc disease identified within the lumbar spine. Bilateral facet degenerative changes noted. No acute bone abnormality noted. IMPRESSION: 1. Left-sided hydronephrosis and hydroureter secondary to 5 mm left mid ureteral calculus. 2. Left-sided nephrolithiasis 3. Aortic atherosclerosis 4. Prior cholecystectomy. Electronically Signed   By: Signa Kell M.D.   On: 09/12/2015 15:41      Everlene Farrier, PA-C 09/12/15 1717  Courteney Randall An, MD 09/13/15 [phone]

## 2015-09-12 NOTE — ED Provider Notes (Signed)
CSN: [id number]     Arrival date & time 09/12/15  1212 History   First MD Initiated Contact with Patient 09/12/15 1256     Chief Complaint  Patient presents with  . Abdominal Pain  (Consider location/radiation/quality/duration/timing/severity/associated sxs/prior Treatment) Patient is a 69 y.o. male presenting with abdominal pain. The history is provided by the patient and the spouse. No language interpreter was used.  Abdominal Pain Associated symptoms: constipation and vomiting   Associated symptoms: no chest pain, no fever and no shortness of breath   Mr. Axelson is a 6 with a history of sleep apnea, diabetes and renal disorder who presents for bilateral lower abdominal pain since yesterday and had one episode of vomiting after dinner last night. He states he has been constipated and had a small pellet poop yesterday and is not able to pass any gas since yesterday. He is also reporting a lot of belching. He states he is on Levaquin for testicular epididymitis since his diagnosis 2 days ago. He reports that his testicle is feeling much better and is less erythematous but is still enlarged. He has chills and his wife states that he has felt weak and not gone to work the last 2 days.  He denies any fever, chest pain, shortness of breath, nausea, diarrhea, hematemesis, dysuria, hematuria, or urinary frequency.   Past Medical History  Diagnosis Date  . Diabetes mellitus without complication (HCC)   . Renal disorder    History reviewed. No pertinent past surgical history. No family history on file. Social History  Substance Use Topics  . Smoking status: Former Smoker -- 0.30 packs/day for 12 years    Types: Cigarettes    Quit date: 10/08/1982  . Smokeless tobacco: Never Used  . Alcohol Use: Yes     Comment: occasionally    Review of Systems  Constitutional: Negative for fever.  Respiratory: Negative for shortness of breath.   Cardiovascular: Negative for chest pain.  Gastrointestinal:  Positive for vomiting, abdominal pain and constipation.  Genitourinary: Positive for testicular pain.  All other systems reviewed and are negative.     Allergies  Review of patient's allergies indicates no known allergies.  Home Medications   Prior to Admission medications   Medication Sig Start Date End Date Taking? Authorizing Provider  aspirin 81 MG tablet Take 81 mg by mouth daily. Reported on 09/12/2015   Yes Historical Provider, MD  cholecalciferol (VITAMIN D) 1000 UNITS tablet Take 1,000 Units by mouth daily.   Yes Historical Provider, MD  levofloxacin (LEVAQUIN) 500 MG tablet Take 1 tablet (500 mg total) by mouth daily. 01/27/13  Yes Duwayne Heck de [place], MD  metFORMIN (GLUCOPHAGE-XR) 500 MG 24 hr tablet Take 500 mg by mouth 2 (two) times daily.   Yes Historical Provider, MD  metoprolol succinate (TOPROL-XL) 50 MG 24 hr tablet Take 50 mg by mouth daily. Take with or immediately following a meal.   Yes Historical Provider, MD  NIFEdipine (PROCARDIA XL/ADALAT-CC) 60 MG 24 hr tablet Take 60 mg by mouth daily.   Yes Historical Provider, MD  prazosin (MINIPRESS) 1 MG capsule Take 1 mg by mouth at bedtime.   Yes Historical Provider, MD  simvastatin (ZOCOR) 40 MG tablet Take 40 mg by mouth every evening.   Yes Historical Provider, MD  testosterone (ANDROGEL) 50 MG/5GM GEL Place 5 g onto the skin daily.   Yes Historical Provider, MD  albuterol (PROVENTIL HFA;VENTOLIN HFA) 108 (90 BASE) MCG/ACT inhaler Inhale 2 puffs into the lungs  every 4 (four) hours as needed for wheezing. Patient not taking: Reported on 09/12/2015 01/27/13   Duwayne Heckeuben M de [place], MD   BP 127/77 mmHg  Pulse 60  Temp(Src) 98.8 F (37.1 C) (Oral)  Resp 17  SpO2 96% Physical Exam  Constitutional: He is oriented to person, place, and time. He appears well-developed and well-nourished.  HENT:  Head: Normocephalic and atraumatic.  Eyes: Conjunctivae are normal.  Neck: Normal range of motion. Neck supple.    Cardiovascular: Normal rate, regular rhythm and normal heart sounds.   Pulmonary/Chest: Effort normal and breath sounds normal.  Abdominal: Soft. He exhibits no distension. There is tenderness in the right lower quadrant and left lower quadrant. There is no rebound and no guarding.    Lower abdominal tenderness to palpation. No guarding or rebound. No abdominal distention.  Genitourinary: Penis normal. Right testis shows swelling and tenderness.  Testicular exam: Chaperone present, enlarged scrotal sac. Mild right-sided testicular pain and swelling. Testicles are within the scrotum.   Musculoskeletal: Normal range of motion.  Neurological: He is alert and oriented to person, place, and time.  Skin: Skin is warm and dry.  Psychiatric: He has a normal mood and affect.  Nursing note and vitals reviewed.   ED Course  Procedures (including critical care time) Labs Review Labs Reviewed  COMPREHENSIVE METABOLIC PANEL - Abnormal; Notable for the following:    CO2 19 (*)    Glucose, Bld 145 (*)    Creatinine, Ser 1.40 (*)    Calcium 8.6 (*)    Albumin 3.3 (*)    Total Bilirubin 2.1 (*)    GFR calc non Af Amer 49 (*)    GFR calc Af Amer 57 (*)    All other components within normal limits  CBC - Abnormal; Notable for the following:    WBC 12.5 (*)    RBC 4.09 (*)    Hemoglobin 12.1 (*)    HCT 37.1 (*)    All other components within normal limits  CBG MONITORING, ED - Abnormal; Notable for the following:    Glucose-Capillary 141 (*)    All other components within normal limits  LIPASE, BLOOD  URINALYSIS, ROUTINE W REFLEX MICROSCOPIC (NOT AT [facility])    Imaging Review No results found. I have personally reviewed and evaluated these images and lab results as part of my medical decision-making.   EKG Interpretation None      MDM   Final diagnoses:  Lower abdominal pain   Patient presents for lower abdominal pain since yesterday with 1 episode of vomiting and constipation.  Unable to pass gas but is burping a lot. He reports chills and weakness. Appears to be in pain but vitals are stable.  He was recently seen for enlarged right testicle. He was told that he had epididymitis and was treated with Levaquin and has been taking it for the past 2 days. His testicular exam shows swelling of the scrotum but patient states it is much better than it appeared 2 days ago. He has a white count of 12.5 and bilirubin is 2.1. His exam is concerning for obstruction.  His CT abdomen is pending.  I discussed this patient with Will Dansie, PA-C.  Plan is to admit if bowel obstruction. There is a possibility of other diagnoses. Patient is stable.      Catha GosselinHanna Patel-Mills, PA-C 09/13/15 0804  Courteney Randall AnLyn Mackuen, MD 09/13/15 1515

## 2015-09-12 NOTE — Discharge Instructions (Signed)
Kidney Stones °Kidney stones (urolithiasis) are deposits that form inside your kidneys. The intense pain is caused by the stone moving through the urinary tract. When the stone moves, the ureter goes into spasm around the stone. The stone is usually passed in the urine.  °CAUSES  °· A disorder that makes certain neck glands produce too much parathyroid hormone (primary hyperparathyroidism). °· A buildup of uric acid crystals, similar to gout in your joints. °· Narrowing (stricture) of the ureter. °· A kidney obstruction present at birth (congenital obstruction). °· Previous surgery on the kidney or ureters. °· Numerous kidney infections. °SYMPTOMS  °· Feeling sick to your stomach (nauseous). °· Throwing up (vomiting). °· Blood in the urine (hematuria). °· Pain that usually spreads (radiates) to the groin. °· Frequency or urgency of urination. °DIAGNOSIS  °· Taking a history and physical exam. °· Blood or urine tests. °· CT scan. °· Occasionally, an examination of the inside of the urinary bladder (cystoscopy) is performed. °TREATMENT  °· Observation. °· Increasing your fluid intake. °· Extracorporeal shock wave lithotripsy--This is a noninvasive procedure that uses shock waves to break up kidney stones. °· Surgery may be needed if you have severe pain or persistent obstruction. There are various surgical procedures. Most of the procedures are performed with the use of small instruments. Only small incisions are needed to accommodate these instruments, so recovery time is minimized. °The size, location, and chemical composition are all important variables that will determine the proper choice of action for you. Talk to your health care provider to better understand your situation so that you will minimize the risk of injury to yourself and your kidney.  °HOME CARE INSTRUCTIONS  °· Drink enough water and fluids to keep your urine clear or pale yellow. This will help you to pass the stone or stone fragments. °· Strain  all urine through the provided strainer. Keep all particulate matter and stones for your health care provider to see. The stone causing the pain may be as small as a grain of salt. It is very important to use the strainer each and every time you pass your urine. The collection of your stone will allow your health care provider to analyze it and verify that a stone has actually passed. The stone analysis will often identify what you can do to reduce the incidence of recurrences. °· Only take over-the-counter or prescription medicines for pain, discomfort, or fever as directed by your health care provider. °· Keep all follow-up visits as told by your health care provider. This is important. °· Get follow-up X-rays if required. The absence of pain does not always mean that the stone has passed. It may have only stopped moving. If the urine remains completely obstructed, it can cause loss of kidney function or even complete destruction of the kidney. It is your responsibility to make sure X-rays and follow-ups are completed. Ultrasounds of the kidney can show blockages and the status of the kidney. Ultrasounds are not associated with any radiation and can be performed easily in a matter of minutes. °· Make changes to your daily diet as told by your health care provider. You may be told to: °¨ Limit the amount of salt that you eat. °¨ Eat 5 or more servings of fruits and vegetables each day. °¨ Limit the amount of meat, poultry, fish, and eggs that you eat. °· Collect a 24-hour urine sample as told by your health care provider. You may need to collect another urine sample every 6-12   months. SEEK MEDICAL CARE IF:  You experience pain that is progressive and unresponsive to any pain medicine you have been prescribed. SEEK IMMEDIATE MEDICAL CARE IF:   Pain cannot be controlled with the prescribed medicine.  You have a fever or shaking chills.  The severity or intensity of pain increases over 18 hours and is not  relieved by pain medicine.  You develop a new onset of abdominal pain.  You feel faint or pass out.  You are unable to urinate.   This information is not intended to replace advice given to you by your health care provider. Make sure you discuss any questions you have with your health care provider.   Document Released: 09/05/2005 Document Revised: 05/27/2015 Document Reviewed: 02/06/2013 Elsevier Interactive Patient Education 2016 Elsevier Inc. High-Fiber Diet Fiber, also called dietary fiber, is a type of carbohydrate found in fruits, vegetables, whole grains, and beans. A high-fiber diet can have many health benefits. Your health care provider may recommend a high-fiber diet to help:  Prevent constipation. Fiber can make your bowel movements more regular.  Lower your cholesterol.  Relieve hemorrhoids, uncomplicated diverticulosis, or irritable bowel syndrome.  Prevent overeating as part of a weight-loss plan.  Prevent heart disease, type 2 diabetes, and certain cancers. WHAT IS MY PLAN? The recommended daily intake of fiber includes:  38 grams for men under age 59.  30 grams for men over age 19.  25 grams for women under age 48.  21 grams for women over age 46. You can get the recommended daily intake of dietary fiber by eating a variety of fruits, vegetables, grains, and beans. Your health care provider may also recommend a fiber supplement if it is not possible to get enough fiber through your diet. WHAT DO I NEED TO KNOW ABOUT A HIGH-FIBER DIET?  Fiber supplements have not been widely studied for their effectiveness, so it is better to get fiber through food sources.  Always check the fiber content on thenutrition facts label of any prepackaged food. Look for foods that contain at least 5 grams of fiber per serving.  Ask your dietitian if you have questions about specific foods that are related to your condition, especially if those foods are not listed in the  following section.  Increase your daily fiber consumption gradually. Increasing your intake of dietary fiber too quickly may cause bloating, cramping, or gas.  Drink plenty of water. Water helps you to digest fiber. WHAT FOODS CAN I EAT? Grains Whole-grain breads. Multigrain cereal. Oats and oatmeal. Brown rice. Barley. Bulgur wheat. Millet. Bran muffins. Popcorn. Rye wafer crackers. Vegetables Sweet potatoes. Spinach. Kale. Artichokes. Cabbage. Broccoli. Green peas. Carrots. Squash. Fruits Berries. Pears. Apples. Oranges. Avocados. Prunes and raisins. Dried figs. Meats and Other Protein Sources Navy, kidney, pinto, and soy beans. Split peas. Lentils. Nuts and seeds. Dairy Fiber-fortified yogurt. Beverages Fiber-fortified soy milk. Fiber-fortified orange juice. Other Fiber bars. The items listed above may not be a complete list of recommended foods or beverages. Contact your dietitian for more options. WHAT FOODS ARE NOT RECOMMENDED? Grains White bread. Pasta made with refined flour. White rice. Vegetables Fried potatoes. Canned vegetables. Well-cooked vegetables.  Fruits Fruit juice. Cooked, strained fruit. Meats and Other Protein Sources Fatty cuts of meat. Fried Environmental education officer or fried fish. Dairy Milk. Yogurt. Cream cheese. Sour cream. Beverages Soft drinks. Other Cakes and pastries. Butter and oils. The items listed above may not be a complete list of foods and beverages to avoid. Contact your dietitian for  more information. WHAT ARE SOME TIPS FOR INCLUDING HIGH-FIBER FOODS IN MY DIET?  Eat a wide variety of high-fiber foods.  Make sure that half of all grains consumed each day are whole grains.  Replace breads and cereals made from refined flour or white flour with whole-grain breads and cereals.  Replace white rice with brown rice, bulgur wheat, or millet.  Start the day with a breakfast that is high in fiber, such as a cereal that contains at least 5 grams of fiber per  serving.  Use beans in place of meat in soups, salads, or pasta.  Eat high-fiber snacks, such as berries, raw vegetables, nuts, or popcorn.   This information is not intended to replace advice given to you by your health care provider. Make sure you discuss any questions you have with your health care provider.   Document Released: 09/05/2005 Document Revised: 09/26/2014 Document Reviewed: 02/18/2014 Elsevier Interactive Patient Education [employer]2016 Elsevier Inc.

## 2015-09-12 NOTE — ED Notes (Signed)
Bed: WA05 Expected date:  Expected time:  Means of arrival:  Comments: Hold for triage 1 

## 2015-09-12 NOTE — ED Notes (Signed)
Per pt, states has been on levaquinn since Thursday, r/t testicals swelling-states increased weakness from pain and vomited yesterday-states he is constipated

## 2015-09-12 NOTE — ED Notes (Signed)
POC CBG measured@141mg /dl. ENM

## 2015-09-14 LAB — URINE CULTURE: Culture: NO GROWTH

## 2015-09-22 DIAGNOSIS — M6281 Muscle weakness (generalized): Secondary | ICD-10-CM | POA: Diagnosis not present

## 2015-09-22 DIAGNOSIS — M19011 Primary osteoarthritis, right shoulder: Secondary | ICD-10-CM | POA: Diagnosis not present

## 2015-09-22 DIAGNOSIS — M25511 Pain in right shoulder: Secondary | ICD-10-CM | POA: Diagnosis not present

## 2015-09-22 DIAGNOSIS — Z96611 Presence of right artificial shoulder joint: Secondary | ICD-10-CM | POA: Diagnosis not present

## 2015-09-23 DIAGNOSIS — M25511 Pain in right shoulder: Secondary | ICD-10-CM | POA: Diagnosis not present

## 2015-09-23 DIAGNOSIS — Z96611 Presence of right artificial shoulder joint: Secondary | ICD-10-CM | POA: Diagnosis not present

## 2015-09-23 DIAGNOSIS — M6281 Muscle weakness (generalized): Secondary | ICD-10-CM | POA: Diagnosis not present

## 2015-09-23 DIAGNOSIS — M19011 Primary osteoarthritis, right shoulder: Secondary | ICD-10-CM | POA: Diagnosis not present

## 2015-09-24 DIAGNOSIS — Z96611 Presence of right artificial shoulder joint: Secondary | ICD-10-CM | POA: Diagnosis not present

## 2015-09-24 DIAGNOSIS — M25511 Pain in right shoulder: Secondary | ICD-10-CM | POA: Diagnosis not present

## 2015-09-24 DIAGNOSIS — M19011 Primary osteoarthritis, right shoulder: Secondary | ICD-10-CM | POA: Diagnosis not present

## 2015-09-24 DIAGNOSIS — M6281 Muscle weakness (generalized): Secondary | ICD-10-CM | POA: Diagnosis not present

## 2015-09-27 ENCOUNTER — Emergency Department (HOSPITAL_COMMUNITY): Payer: BLUE CROSS/BLUE SHIELD | Admitting: Anesthesiology

## 2015-09-27 ENCOUNTER — Observation Stay (HOSPITAL_COMMUNITY)
Admission: EM | Admit: 2015-09-27 | Discharge: 2015-09-29 | Disposition: A | Discharge status: Home / Self Care | Payer: BLUE CROSS/BLUE SHIELD | Attending: Urology | Admitting: Urology

## 2015-09-27 ENCOUNTER — Encounter (HOSPITAL_COMMUNITY)
Admission: EM | Disposition: A | Discharge status: Home / Self Care | Payer: Self-pay | Source: Home / Self Care | Attending: Emergency Medicine

## 2015-09-27 ENCOUNTER — Encounter (HOSPITAL_COMMUNITY): Payer: Self-pay

## 2015-09-27 ENCOUNTER — Emergency Department (HOSPITAL_COMMUNITY): Payer: BLUE CROSS/BLUE SHIELD

## 2015-09-27 DIAGNOSIS — Z87442 Personal history of urinary calculi: Secondary | ICD-10-CM | POA: Diagnosis not present

## 2015-09-27 DIAGNOSIS — Z87891 Personal history of nicotine dependence: Secondary | ICD-10-CM | POA: Insufficient documentation

## 2015-09-27 DIAGNOSIS — R1030 Lower abdominal pain, unspecified: Secondary | ICD-10-CM | POA: Diagnosis not present

## 2015-09-27 DIAGNOSIS — N132 Hydronephrosis with renal and ureteral calculous obstruction: Principal | ICD-10-CM | POA: Insufficient documentation

## 2015-09-27 DIAGNOSIS — K760 Fatty (change of) liver, not elsewhere classified: Secondary | ICD-10-CM | POA: Insufficient documentation

## 2015-09-27 DIAGNOSIS — Z7951 Long term (current) use of inhaled steroids: Secondary | ICD-10-CM | POA: Diagnosis not present

## 2015-09-27 DIAGNOSIS — B9620 Unspecified Escherichia coli [E. coli] as the cause of diseases classified elsewhere: Secondary | ICD-10-CM | POA: Diagnosis not present

## 2015-09-27 DIAGNOSIS — N390 Urinary tract infection, site not specified: Secondary | ICD-10-CM | POA: Insufficient documentation

## 2015-09-27 DIAGNOSIS — J9811 Atelectasis: Secondary | ICD-10-CM | POA: Insufficient documentation

## 2015-09-27 DIAGNOSIS — N179 Acute kidney failure, unspecified: Secondary | ICD-10-CM | POA: Insufficient documentation

## 2015-09-27 DIAGNOSIS — B952 Enterococcus as the cause of diseases classified elsewhere: Secondary | ICD-10-CM | POA: Insufficient documentation

## 2015-09-27 DIAGNOSIS — Z7984 Long term (current) use of oral hypoglycemic drugs: Secondary | ICD-10-CM | POA: Diagnosis not present

## 2015-09-27 DIAGNOSIS — Z9049 Acquired absence of other specified parts of digestive tract: Secondary | ICD-10-CM | POA: Diagnosis not present

## 2015-09-27 DIAGNOSIS — R000 Tachycardia, unspecified: Secondary | ICD-10-CM | POA: Insufficient documentation

## 2015-09-27 DIAGNOSIS — K4090 Unilateral inguinal hernia, without obstruction or gangrene, not specified as recurrent: Secondary | ICD-10-CM | POA: Diagnosis not present

## 2015-09-27 DIAGNOSIS — R509 Fever, unspecified: Secondary | ICD-10-CM | POA: Diagnosis not present

## 2015-09-27 DIAGNOSIS — I708 Atherosclerosis of other arteries: Secondary | ICD-10-CM | POA: Diagnosis not present

## 2015-09-27 DIAGNOSIS — R1032 Left lower quadrant pain: Secondary | ICD-10-CM | POA: Diagnosis not present

## 2015-09-27 DIAGNOSIS — D72829 Elevated white blood cell count, unspecified: Secondary | ICD-10-CM | POA: Diagnosis not present

## 2015-09-27 DIAGNOSIS — E119 Type 2 diabetes mellitus without complications: Secondary | ICD-10-CM | POA: Insufficient documentation

## 2015-09-27 DIAGNOSIS — Z79899 Other long term (current) drug therapy: Secondary | ICD-10-CM | POA: Insufficient documentation

## 2015-09-27 DIAGNOSIS — R4182 Altered mental status, unspecified: Secondary | ICD-10-CM | POA: Diagnosis not present

## 2015-09-27 DIAGNOSIS — N201 Calculus of ureter: Secondary | ICD-10-CM | POA: Diagnosis present

## 2015-09-27 DIAGNOSIS — A419 Sepsis, unspecified organism: Secondary | ICD-10-CM | POA: Diagnosis present

## 2015-09-27 DIAGNOSIS — N200 Calculus of kidney: Secondary | ICD-10-CM | POA: Diagnosis not present

## 2015-09-27 DIAGNOSIS — K5730 Diverticulosis of large intestine without perforation or abscess without bleeding: Secondary | ICD-10-CM | POA: Insufficient documentation

## 2015-09-27 DIAGNOSIS — Z7982 Long term (current) use of aspirin: Secondary | ICD-10-CM | POA: Insufficient documentation

## 2015-09-27 DIAGNOSIS — M1990 Unspecified osteoarthritis, unspecified site: Secondary | ICD-10-CM | POA: Insufficient documentation

## 2015-09-27 HISTORY — PX: CYSTOSCOPY W/ URETERAL STENT PLACEMENT: SHX1429

## 2015-09-27 LAB — CBC WITH DIFFERENTIAL/PLATELET
BASOS PCT: 0 %
Basophils Absolute: 0 10*3/uL (ref 0.0–0.1)
EOS ABS: 0.1 10*3/uL (ref 0.0–0.7)
EOS PCT: 0 %
HCT: 34.9 % — ABNORMAL LOW (ref 39.0–52.0)
Hemoglobin: 10.7 g/dL — ABNORMAL LOW (ref 13.0–17.0)
LYMPHS ABS: 1.4 10*3/uL (ref 0.7–4.0)
Lymphocytes Relative: 8 %
MCH: 28.5 pg (ref 26.0–34.0)
MCHC: 30.7 g/dL (ref 30.0–36.0)
MCV: 92.8 fL (ref 78.0–100.0)
Monocytes Absolute: 1.1 10*3/uL — ABNORMAL HIGH (ref 0.1–1.0)
Monocytes Relative: 6 %
NEUTROS PCT: 86 %
Neutro Abs: 15.4 10*3/uL — ABNORMAL HIGH (ref 1.7–7.7)
PLATELETS: 466 10*3/uL — AB (ref 150–400)
RBC: 3.76 MIL/uL — AB (ref 4.22–5.81)
RDW: 15.2 % (ref 11.5–15.5)
WBC: 18 10*3/uL — AB (ref 4.0–10.5)

## 2015-09-27 LAB — URINE MICROSCOPIC-ADD ON

## 2015-09-27 LAB — BASIC METABOLIC PANEL
Anion gap: 11 (ref 5–15)
BUN: 14 mg/dL (ref 6–20)
CALCIUM: 8.5 mg/dL — AB (ref 8.9–10.3)
CHLORIDE: 98 mmol/L — AB (ref 101–111)
CO2: 28 mmol/L (ref 22–32)
CREATININE: 1.59 mg/dL — AB (ref 0.61–1.24)
GFR calc non Af Amer: 42 mL/min — ABNORMAL LOW (ref >60)
GFR, EST AFRICAN AMERICAN: 49 mL/min — AB (ref >60)
Glucose, Bld: 156 mg/dL — ABNORMAL HIGH (ref 65–99)
Potassium: 4.4 mmol/L (ref 3.5–5.1)
SODIUM: 137 mmol/L (ref 135–145)

## 2015-09-27 LAB — URINALYSIS, ROUTINE W REFLEX MICROSCOPIC
Bilirubin Urine: NEGATIVE
Glucose, UA: NEGATIVE mg/dL
KETONES UR: NEGATIVE mg/dL
NITRITE: NEGATIVE
PROTEIN: 100 mg/dL — AB
Specific Gravity, Urine: 1.018 (ref 1.005–1.030)
pH: 6 (ref 5.0–8.0)

## 2015-09-27 LAB — I-STAT CG4 LACTIC ACID, ED: Lactic Acid, Venous: 1.91 mmol/L (ref 0.5–2.0)

## 2015-09-27 LAB — GLUCOSE, CAPILLARY
GLUCOSE-CAPILLARY: 251 mg/dL — AB (ref 65–99)
Glucose-Capillary: 265 mg/dL — ABNORMAL HIGH (ref 65–99)

## 2015-09-27 LAB — PROTIME-INR
INR: 1.11 (ref 0.00–1.49)
PROTHROMBIN TIME: 14.5 s (ref 11.6–15.2)

## 2015-09-27 LAB — LACTIC ACID, PLASMA: LACTIC ACID, VENOUS: 1.4 mmol/L (ref 0.5–2.0)

## 2015-09-27 SURGERY — CYSTOSCOPY, WITH RETROGRADE PYELOGRAM AND URETERAL STENT INSERTION
Anesthesia: General | Laterality: Left

## 2015-09-27 MED ORDER — ALBUTEROL SULFATE (2.5 MG/3ML) 0.083% IN NEBU: 2.5000 mg | INHALATION_SOLUTION | RESPIRATORY_TRACT | Status: DC | PRN

## 2015-09-27 MED ORDER — DEXTROSE-NACL 5-0.9 % IV SOLN
INTRAVENOUS | Status: DC
  Administered 2015-09-27 – 2015-09-28 (×4): via INTRAVENOUS
  Administered 2015-09-29: 75 mL via INTRAVENOUS

## 2015-09-27 MED ORDER — HYDROMORPHONE HCL 1 MG/ML IJ SOLN
1.0000 mg | Freq: Once | INTRAMUSCULAR | Status: AC
  Administered 2015-09-27: 1 mg via INTRAVENOUS
  Filled 2015-09-27: qty 1

## 2015-09-27 MED ORDER — ONDANSETRON HCL 4 MG/2ML IJ SOLN: 4.0000 mg | INTRAMUSCULAR | Status: DC | PRN

## 2015-09-27 MED ORDER — METOPROLOL SUCCINATE ER 50 MG PO TB24
50.0000 mg | ORAL_TABLET | Freq: Every day | ORAL | Status: DC
  Administered 2015-09-27 – 2015-09-29 (×3): 50 mg via ORAL
  Filled 2015-09-27 (×3): qty 1

## 2015-09-27 MED ORDER — SODIUM CHLORIDE 0.9 % IR SOLN
Status: DC | PRN
  Administered 2015-09-27: 1000 mL via INTRAVESICAL

## 2015-09-27 MED ORDER — SUCCINYLCHOLINE CHLORIDE 20 MG/ML IJ SOLN
INTRAMUSCULAR | Status: DC | PRN
  Administered 2015-09-27: 100 mg via INTRAVENOUS

## 2015-09-27 MED ORDER — VANCOMYCIN HCL IN DEXTROSE 1-5 GM/200ML-% IV SOLN: 1000.0000 mg | Freq: Two times a day (BID) | INTRAVENOUS | Status: DC

## 2015-09-27 MED ORDER — ASPIRIN EC 81 MG PO TBEC
81.0000 mg | DELAYED_RELEASE_TABLET | Freq: Every day | ORAL | Status: DC
  Administered 2015-09-27 – 2015-09-29 (×3): 81 mg via ORAL
  Filled 2015-09-27 (×3): qty 1

## 2015-09-27 MED ORDER — LIDOCAINE HCL (CARDIAC) 20 MG/ML IV SOLN
INTRAVENOUS | Status: DC | PRN
  Administered 2015-09-27: 75 mg via INTRAVENOUS

## 2015-09-27 MED ORDER — PANTOPRAZOLE SODIUM 40 MG PO TBEC
40.0000 mg | DELAYED_RELEASE_TABLET | Freq: Every day | ORAL | Status: DC
  Administered 2015-09-27 – 2015-09-29 (×3): 40 mg via ORAL
  Filled 2015-09-27 (×3): qty 1

## 2015-09-27 MED ORDER — POLYETHYLENE GLYCOL 3350 17 G PO PACK
17.0000 g | PACK | Freq: Two times a day (BID) | ORAL | Status: DC
  Administered 2015-09-28 – 2015-09-29 (×3): 17 g via ORAL
  Filled 2015-09-27 (×4): qty 1

## 2015-09-27 MED ORDER — LACTATED RINGERS IV SOLN
INTRAVENOUS | Status: DC | PRN
  Administered 2015-09-27 (×2): via INTRAVENOUS

## 2015-09-27 MED ORDER — EPHEDRINE SULFATE 50 MG/ML IJ SOLN
INTRAMUSCULAR | Status: DC | PRN
  Administered 2015-09-27: 5 mg via INTRAVENOUS

## 2015-09-27 MED ORDER — SENNA 8.6 MG PO TABS
1.0000 | ORAL_TABLET | Freq: Two times a day (BID) | ORAL | Status: DC
  Administered 2015-09-27 – 2015-09-29 (×4): 8.6 mg via ORAL
  Filled 2015-09-27 (×4): qty 1

## 2015-09-27 MED ORDER — ONDANSETRON HCL 4 MG/2ML IJ SOLN: 4.0000 mg | Freq: Once | INTRAMUSCULAR | Status: DC | PRN

## 2015-09-27 MED ORDER — PROPOFOL 10 MG/ML IV BOLUS
INTRAVENOUS | Status: DC | PRN
  Administered 2015-09-27: 50 mg via INTRAVENOUS
  Administered 2015-09-27: 150 mg via INTRAVENOUS

## 2015-09-27 MED ORDER — CEFAZOLIN SODIUM-DEXTROSE 2-3 GM-% IV SOLR
INTRAVENOUS | Status: DC | PRN
  Administered 2015-09-27: 2 g via INTRAVENOUS

## 2015-09-27 MED ORDER — ACETAMINOPHEN 325 MG PO TABS
650.0000 mg | ORAL_TABLET | Freq: Once | ORAL | Status: AC
  Administered 2015-09-27: 650 mg via ORAL
  Filled 2015-09-27: qty 2

## 2015-09-27 MED ORDER — SODIUM CHLORIDE 0.9 % IV BOLUS (SEPSIS)
1000.0000 mL | Freq: Once | INTRAVENOUS | Status: DC
Start: 2015-09-27 — End: 2015-09-29

## 2015-09-27 MED ORDER — ALBUTEROL SULFATE HFA 108 (90 BASE) MCG/ACT IN AERS: 2.0000 | INHALATION_SPRAY | RESPIRATORY_TRACT | Status: DC | PRN

## 2015-09-27 MED ORDER — MIDAZOLAM HCL 5 MG/5ML IJ SOLN
INTRAMUSCULAR | Status: DC | PRN
  Administered 2015-09-27: 0.5 mg via INTRAVENOUS

## 2015-09-27 MED ORDER — 0.9 % SODIUM CHLORIDE (POUR BTL) OPTIME
TOPICAL | Status: DC | PRN
  Administered 2015-09-27: 1000 mL

## 2015-09-27 MED ORDER — BACITRACIN-NEOMYCIN-POLYMYXIN 400-5-5000 EX OINT: 1.0000 "application " | TOPICAL_OINTMENT | Freq: Three times a day (TID) | CUTANEOUS | Status: DC | PRN

## 2015-09-27 MED ORDER — PROPOFOL 10 MG/ML IV BOLUS
INTRAVENOUS | Status: AC
  Filled 2015-09-27: qty 20

## 2015-09-27 MED ORDER — INSULIN ASPART 100 UNIT/ML ~~LOC~~ SOLN
0.0000 [IU] | SUBCUTANEOUS | Status: DC
  Administered 2015-09-27: 8 [IU] via SUBCUTANEOUS
  Administered 2015-09-28: 3 [IU] via SUBCUTANEOUS
  Administered 2015-09-28 (×2): 2 [IU] via SUBCUTANEOUS
  Administered 2015-09-29: 5 [IU] via SUBCUTANEOUS
  Administered 2015-09-29: 2 [IU] via SUBCUTANEOUS

## 2015-09-27 MED ORDER — DOCUSATE SODIUM 100 MG PO CAPS
100.0000 mg | ORAL_CAPSULE | Freq: Two times a day (BID) | ORAL | Status: DC
  Administered 2015-09-27 – 2015-09-29 (×4): 100 mg via ORAL
  Filled 2015-09-27 (×4): qty 1

## 2015-09-27 MED ORDER — VANCOMYCIN HCL IN DEXTROSE 1-5 GM/200ML-% IV SOLN
1000.0000 mg | Freq: Once | INTRAVENOUS | Status: AC
  Administered 2015-09-27: 1000 mg via INTRAVENOUS

## 2015-09-27 MED ORDER — SODIUM CHLORIDE 0.9 % IV SOLN: INTRAVENOUS | Status: DC

## 2015-09-27 MED ORDER — IOHEXOL 300 MG/ML  SOLN
INTRAMUSCULAR | Status: DC | PRN
  Administered 2015-09-27: 50 mL via URETHRAL

## 2015-09-27 MED ORDER — DEXTROSE 5 % IV SOLN
1.0000 g | Freq: Three times a day (TID) | INTRAVENOUS | Status: DC
  Administered 2015-09-28 – 2015-09-29 (×5): 1 g via INTRAVENOUS
  Filled 2015-09-27 (×8): qty 1

## 2015-09-27 MED ORDER — TAMSULOSIN HCL 0.4 MG PO CAPS
0.4000 mg | ORAL_CAPSULE | Freq: Every day | ORAL | Status: DC
  Administered 2015-09-27 – 2015-09-29 (×3): 0.4 mg via ORAL
  Filled 2015-09-27 (×3): qty 1

## 2015-09-27 MED ORDER — SIMVASTATIN 40 MG PO TABS
40.0000 mg | ORAL_TABLET | Freq: Every evening | ORAL | Status: DC
  Administered 2015-09-27 – 2015-09-29 (×3): 40 mg via ORAL
  Filled 2015-09-27 (×3): qty 1

## 2015-09-27 MED ORDER — CEFAZOLIN SODIUM-DEXTROSE 2-3 GM-% IV SOLR
INTRAVENOUS | Status: AC
  Filled 2015-09-27: qty 50

## 2015-09-27 MED ORDER — MORPHINE SULFATE (PF) 2 MG/ML IV SOLN
2.0000 mg | INTRAVENOUS | Status: DC | PRN
  Administered 2015-09-27: 2 mg via INTRAVENOUS
  Administered 2015-09-28: 4 mg via INTRAVENOUS
  Administered 2015-09-28: 3 mg via INTRAVENOUS
  Administered 2015-09-28: 4 mg via INTRAVENOUS
  Administered 2015-09-28: 2 mg via INTRAVENOUS
  Administered 2015-09-28 – 2015-09-29 (×3): 4 mg via INTRAVENOUS
  Filled 2015-09-27 (×2): qty 2
  Filled 2015-09-27: qty 1
  Filled 2015-09-27 (×4): qty 2
  Filled 2015-09-27: qty 1

## 2015-09-27 MED ORDER — FENTANYL CITRATE (PF) 100 MCG/2ML IJ SOLN: 25.0000 ug | INTRAMUSCULAR | Status: DC | PRN

## 2015-09-27 MED ORDER — SODIUM CHLORIDE 0.9 % IV BOLUS (SEPSIS)
1000.0000 mL | Freq: Once | INTRAVENOUS | Status: AC
  Administered 2015-09-27: 1000 mL via INTRAVENOUS

## 2015-09-27 MED ORDER — VANCOMYCIN HCL IN DEXTROSE 750-5 MG/150ML-% IV SOLN
750.0000 mg | Freq: Two times a day (BID) | INTRAVENOUS | Status: DC
  Filled 2015-09-27: qty 150

## 2015-09-27 MED ORDER — MIDAZOLAM HCL 2 MG/2ML IJ SOLN
INTRAMUSCULAR | Status: AC
  Filled 2015-09-27: qty 2

## 2015-09-27 MED ORDER — ONDANSETRON HCL 4 MG/2ML IJ SOLN
INTRAMUSCULAR | Status: DC | PRN
  Administered 2015-09-27 (×2): 2 mg via INTRAVENOUS

## 2015-09-27 MED ORDER — FENTANYL CITRATE (PF) 100 MCG/2ML IJ SOLN
INTRAMUSCULAR | Status: AC
  Filled 2015-09-27: qty 2

## 2015-09-27 MED ORDER — OXYBUTYNIN CHLORIDE 5 MG PO TABS
5.0000 mg | ORAL_TABLET | Freq: Three times a day (TID) | ORAL | Status: DC | PRN
  Administered 2015-09-28: 5 mg via ORAL
  Filled 2015-09-27: qty 1

## 2015-09-27 MED ORDER — VANCOMYCIN HCL IN DEXTROSE 1-5 GM/200ML-% IV SOLN
INTRAVENOUS | Status: AC
  Filled 2015-09-27: qty 200

## 2015-09-27 MED ORDER — STERILE WATER FOR IRRIGATION IR SOLN
Status: DC | PRN
  Administered 2015-09-27: 3000 mL via INTRAVESICAL

## 2015-09-27 MED ORDER — OXYCODONE HCL 5 MG PO TABS: 5.0000 mg | ORAL_TABLET | ORAL | Status: DC | PRN

## 2015-09-27 SURGICAL SUPPLY — 13 items
BAG URO CATCHER STRL LF (MISCELLANEOUS) ×2 IMPLANT
BASKET LASER NITINOL 1.9FR (BASKET) IMPLANT
BSKT STON RTRVL 120 1.9FR (BASKET)
CATH INTERMIT  6FR 70CM (CATHETERS) ×1 IMPLANT
GLOVE BIOGEL M STRL SZ7.5 (GLOVE) ×2 IMPLANT
GOWN STRL REUS W/TWL LRG LVL3 (GOWN DISPOSABLE) ×4 IMPLANT
GUIDEWIRE ANG ZIPWIRE 038X150 (WIRE) ×2 IMPLANT
GUIDEWIRE STR DUAL SENSOR (WIRE) ×2 IMPLANT
MANIFOLD NEPTUNE II (INSTRUMENTS) ×2 IMPLANT
PACK CYSTO (CUSTOM PROCEDURE TRAY) ×2 IMPLANT
STENT CONTOUR 6FRX26X.038 (STENTS) ×1 IMPLANT
TUBE FEEDING 8FR 16IN STR KANG (MISCELLANEOUS) ×2 IMPLANT
TUBING CONNECTING 10 (TUBING) ×2 IMPLANT

## 2015-09-27 NOTE — Brief Op Note (Signed)
09/27/2015  8:04 PM  PATIENT:  Seth Lewis  70 y.o. male  PRE-OPERATIVE DIAGNOSIS:  nephrolithiasis and fever  POST-OPERATIVE DIAGNOSIS:  nephrolithiasis and fever  PROCEDURE:  Procedure(s): CYSTOSCOPY WITH RETROGRADE PYELOGRAM/URETERAL STENT PLACEMENT (Left)  SURGEON:  Surgeon(s) and Role:    * Sebastian Acheheodore Johntae Broxterman, MD - Primary  PHYSICIAN ASSISTANT:   ASSISTANTS: Mauri BrooklynMatt Lyons MD   ANESTHESIA:   general  EBL:     BLOOD ADMINISTERED:none  DRAINS: none   LOCAL MEDICATIONS USED:  NONE  SPECIMEN:  Source of Specimen:  Left renal pelvis urine  DISPOSITION OF SPECIMEN:  microbiology for gram stain and culture  COUNTS:  YES  TOURNIQUET:  * No tourniquets in log *  DICTATION: .Other Dictation: Dictation Number [id number]  PLAN OF CARE: Admit to inpatient   PATIENT DISPOSITION:  PACU - hemodynamically stable.   Delay start of Pharmacological VTE agent (>24hrs) due to surgical blood loss or risk of bleeding: yes

## 2015-09-27 NOTE — Anesthesia Procedure Notes (Addendum)
Procedure Name: Intubation Date/Time: 09/27/2015 7:37 PM Performed by: Edison PaceGRAY, JOANNE E Pre-anesthesia Checklist: Patient identified, Emergency Drugs available, Suction available, Timeout performed and Patient being monitored Patient Re-evaluated:Patient Re-evaluated prior to inductionOxygen Delivery Method: Circle system utilized Preoxygenation: Pre-oxygenation with 100% oxygen Intubation Type: Cricoid Pressure applied, IV induction and Rapid sequence Laryngoscope Size: Glidescope and 4 Grade View: Grade II Tube type: Oral Tube size: 7.5 mm Number of attempts: 2 Airway Equipment and Method: Video-laryngoscopy and Stylet Placement Confirmation: ETT inserted through vocal cords under direct vision,  positive ETCO2 and breath sounds checked- equal and bilateral Secured at: 26 cm Tube secured with: Tape Dental Injury: Teeth and Oropharynx as per pre-operative assessment  Difficulty Due To: Difficulty was anticipated Comments: Elective glidescope due to past hx of possible difficult intubation. CRNA attempted first intubation. After manipulation of blade, she obtained a grade IIb view and passed ETT. After cuff inflation, audible sound of air in oropharynx. I reinserted the glidescope and cuff was inflated above the vocal cords. The cuff was deflated and ETT advanced thru the vocal cords. MDA view was a grade I view.

## 2015-09-27 NOTE — Progress Notes (Signed)
ANTIBIOTIC CONSULT NOTE - INITIAL  Pharmacy Consult for may adjust Antibiotic Indication: renal stones, rule out UTI/Pyelonephritis/Sepsis  No Known Allergies  Patient Measurements:   Total body weight:   Vital Signs: Temp: 102.7 F (39.3 C) (01/08 1746) Temp Source: Oral (01/08 1746) BP: 136/70 mmHg (01/08 1746) Pulse Rate: 81 (01/08 1746) Intake/Output from previous day:   Intake/Output from this shift:    Labs: No results for input(s): WBC, HGB, PLT, LABCREA, CREATININE in the last 72 hours. CrCl cannot be calculated (Unknown ideal weight.). No results for input(s): VANCOTROUGH, VANCOPEAK, VANCORANDOM, GENTTROUGH, GENTPEAK, GENTRANDOM, TOBRATROUGH, TOBRAPEAK, TOBRARND, AMIKACINPEAK, AMIKACINTROU, AMIKACIN in the last 72 hours.   Microbiology: Recent Results (from the past 720 hour(s))  Urine culture     Status: None   Collection Time: 09/12/15  2:22 PM  Result Value Ref Range Status   Specimen Description URINE, CLEAN CATCH  Final   Special Requests levaquin Immunocompromised  Final   Culture   Final    NO GROWTH 2 DAYS Performed [facility]    Report Status 09/14/2015 FINAL  Final   Medical History: Past Medical History  Diagnosis Date  . Diabetes mellitus without complication (HCC)   . Renal disorder   . Kidney stone on left side    Medications:  Scheduled:   Anti-infectives    Start     Dose/Rate Route Frequency Ordered Stop   09/27/15 2000  ceFEPIme (MAXIPIME) 1 g in dextrose 5 % 50 mL IVPB     1 g 100 mL/hr over 30 Minutes Intravenous Every 8 hours 09/27/15 1832     09/27/15 1845  vancomycin (VANCOCIN) IVPB 1000 mg/200 mL premix     1,000 mg 200 mL/hr over 60 Minutes Intravenous Every 12 hours 09/27/15 1832       Assessment: 69 yoM with renal stone, fever, flank pain x several weeks. Vanc and Cefepime begun in ED, pharmacy may adjust for renal function.  Goal of Therapy:  Vancomycin trough level 15-20 mcg/ml  Plan:   Cefepime 1gm  q8  Vancomycin 1gm ordered, not yet given in ED  Follow with Vancomycin 750mg  IV q12  Planned stent placement,   Otho BellowsGreen, Kameran Mcneese L PharmD Pager [phone] 09/27/2015, 7:35 PM

## 2015-09-27 NOTE — Consult Note (Signed)
Urology H&P   Requesting Provider:  Freida BusmanAllen, MD  Reason for Consult:  Nephrolithiasis and fever  HPI:  69yM we are seeing in consultation at the request of Dr. Freida BusmanAllen for management of nephrolithiasis in the setting of fever and altered mental status. Patient has prior history of nephrolithiasis (s/p RIGHT PCNL complicated by pleural injury) and is followed by Dr. Patsi Searsannenbaum. He has a known 1.4 cm upper pole calculus as well as a 5 mm mid ureteral calculus diagnosed on CT scan 09/12/15. He has been on medical expulsive therapy and feels as though he has passed the distal stone. He has been having worsening LEFT flank They [facility] with 5 days of LEFT flank pain with radiation to the back and fever to 102.7 in the setting of known nephrolithiasis. Pain has been constant and is an 8/10. They endorse/deny symptoms of dysuria, urgency, frequency and gross hematuria. Upon arrival to the ED vital signs were notable for a temperature of 102.7, normotensive, non-tachycardic.   He last ate a "few bites of spaghetti with marinara sauce" at 2 pm this afternoon.  Serum labs have been reviewed and are significant for leukocytosis to 18,000. Creatinine of 1.4 from baseline of 1.2. Bicarb is 19, Lactate is 1.91 Urinalysis was obtained via clean catch and is pending.  CT imaging was obtained in the ED that demonstrated a 5 mm obstructing stone at the LEFT UVJ (as well as non-obstructing stones in the LEFT upper pole measuring ~1.4cm).  The patient is a diabetic. He does have a history of sever belching which has been treated and is now resolved.  Patient does have a Insurance underwriterUrologist Patsi Sears(Tannenbaum). Patient was last seen .  He has a history of nephrolithiasis.  Past Medical History  Diagnosis Date  . Diabetes mellitus without complication (HCC)   . Renal disorder   . Kidney stone on left side     History reviewed. No pertinent past surgical history.  Current Hospital Medications:  Home Meds:     Medication List    ASK your doctor about these medications        albuterol 108 (90 Base) MCG/ACT inhaler  Commonly known as:  PROVENTIL HFA;VENTOLIN HFA  Inhale 2 puffs into the lungs every 4 (four) hours as needed for wheezing.     aspirin 81 MG tablet  Take 81 mg by mouth daily. Reported on 09/12/2015     cholecalciferol 1000 units tablet  Commonly known as:  VITAMIN D  Take 1,000 Units by mouth daily.     levofloxacin 500 MG tablet  Commonly known as:  LEVAQUIN  Take 1 tablet (500 mg total) by mouth daily.     metFORMIN 500 MG 24 hr tablet  Commonly known as:  GLUCOPHAGE-XR  Take 500 mg by mouth 2 (two) times daily.     metoprolol succinate 50 MG 24 hr tablet  Commonly known as:  TOPROL-XL  Take 50 mg by mouth daily. Take with or immediately following a meal.     NIFEdipine 60 MG 24 hr tablet  Commonly known as:  PROCARDIA XL/ADALAT-CC  Take 60 mg by mouth daily.     omeprazole 20 MG capsule  Commonly known as:  PRILOSEC  Take 1 capsule (20 mg total) by mouth daily.     ondansetron 4 MG disintegrating tablet  Commonly known as:  ZOFRAN ODT  Take 1 tablet (4 mg total) by mouth every 8 (eight) hours as needed for nausea or vomiting.  oxyCODONE-acetaminophen 5-325 MG tablet  Commonly known as:  PERCOCET/ROXICET  Take 1-2 tablets by mouth every 6 (six) hours as needed for moderate pain or severe pain.     polyethylene glycol powder powder  Commonly known as:  GLYCOLAX/MIRALAX  Take 17 g by mouth 2 (two) times daily. Until daily soft stools  OTC     prazosin 1 MG capsule  Commonly known as:  MINIPRESS  Take 1 mg by mouth at bedtime.     simvastatin 40 MG tablet  Commonly known as:  ZOCOR  Take 40 mg by mouth every evening.     tamsulosin 0.4 MG Caps capsule  Commonly known as:  FLOMAX  Take 1 capsule (0.4 mg total) by mouth daily.     testosterone 50 MG/5GM (1%) Gel  Commonly known as:  ANDROGEL  Place 5 g onto the skin daily.        Scheduled  Meds: Continuous Infusions: PRN Meds:.  Allergies: No Known Allergies  No family history on file.  Social History:  reports that he quit smoking about 32 years ago. His smoking use included Cigarettes. He has a 3.6 pack-year smoking history. He has never used smokeless tobacco. He reports that he drinks alcohol. His drug history is not on file.  ROS: A complete review of systems was performed.  All systems are negative except for pertinent findings as noted.  Physical Exam:  Vital signs in last 24 hours: Temp:  [102.7 F (39.3 C)] 102.7 F (39.3 C) (01/08 1746) Pulse Rate:  [81] 81 (01/08 1746) Resp:  [20] 20 (01/08 1746) BP: (136)/(70) 136/70 mmHg (01/08 1746) SpO2:  [98 %] 98 % (01/08 1746) Constitutional:  Alert and oriented, No acute distress Cardiovascular: Regular rate and rhythm, No JVD Respiratory: Normal respiratory effort, Lungs clear bilaterally GI: Abdomen is soft, nontender, nondistended, no abdominal masses GU: No CVA tenderness Lymphatic: No lymphadenopathy Neurologic: Grossly intact, no focal deficits Psychiatric: Normal mood and affect  Laboratory Data:   Recent Labs  09/27/15 1804  WBC 18.0*  HGB 10.7*  HCT 34.9*  PLT 466*    No results for input(s): NA, K, CL, GLUCOSE, BUN, CALCIUM, CREATININE in the last 72 hours.  Invalid input(s): CO3   Results for orders placed or performed during the hospital encounter of 09/27/15 (from the past 24 hour(s))  CBC with Differential/Platelet     Status: Abnormal   Collection Time: 09/27/15  6:04 PM  Result Value Ref Range   WBC 18.0 (H) 4.0 - 10.5 K/uL   RBC 3.76 (L) 4.22 - 5.81 MIL/uL   Hemoglobin 10.7 (L) 13.0 - 17.0 g/dL   HCT 86.534.9 (L) 78.439.0 - 69.652.0 %   MCV 92.8 78.0 - 100.0 fL   MCH 28.5 26.0 - 34.0 pg   MCHC 30.7 30.0 - 36.0 g/dL   RDW 29.515.2 28.411.5 - 13.215.5 %   Platelets 466 (H) 150 - 400 K/uL   Neutrophils Relative % 86 %   Neutro Abs 15.4 (H) 1.7 - 7.7 K/uL   Lymphocytes Relative 8 %   Lymphs Abs 1.4  0.7 - 4.0 K/uL   Monocytes Relative 6 %   Monocytes Absolute 1.1 (H) 0.1 - 1.0 K/uL   Eosinophils Relative 0 %   Eosinophils Absolute 0.1 0.0 - 0.7 K/uL   Basophils Relative 0 %   Basophils Absolute 0.0 0.0 - 0.1 K/uL  I-Stat CG4 Lactic Acid, ED     Status: None   Collection Time: 09/27/15  6:16 PM  Result Value Ref Range   Lactic Acid, Venous 1.91 0.5 - 2.0 mmol/L   No results found for this or any previous visit (from the past 240 hour(s)).  Renal Function: No results for input(s): CREATININE in the last 168 hours. CrCl cannot be calculated (Unknown ideal weight.).  Radiologic Imaging: Ct Renal Stone Study  09/27/2015  CLINICAL DATA:  Left flank pain.  Fever today.  Initial encounter. EXAM: CT ABDOMEN AND PELVIS WITHOUT CONTRAST TECHNIQUE: Multidetector CT imaging of the abdomen and pelvis was performed following the standard protocol without IV contrast. COMPARISON:  CT abdomen and pelvis 09/12/2015. FINDINGS: Mild dependent atelectasis is present in the lung bases. No pleural or pericardial effusion. There is moderate left hydronephrosis which appears worsened since the prior examination. Previously seen stone in the left ureteral stone which just below the pelvic brim on the prior study has migrated distally and is now at the UVJ. The stone measures approximately 0.4 cm. A nonobstructing 1.8 cm stone is again seen in the upper pole of the left kidney. No other urinary tract stones are identified. Scarring in the lower pole of the right kidney is noted. The patient is status post cholecystectomy. The liver is low attenuating consistent with fatty infiltration. The spleen, adrenal glands and pancreas appear normal. Retroaortic left renal vein is noted. Fat containing right inguinal hernias are identified, larger on the right. There is some sigmoid diverticulosis without diverticulitis. The colon is otherwise unremarkable. The stomach, small bowel and appendix appear normal. Scattered aortoiliac  atherosclerosis without aneurysm is identified. No lytic or sclerotic bony lesion is seen. IMPRESSION: Moderate left hydronephrosis appears worse than on the prior CT scan. Previously seen left ureteral stone has progressed down the ureter is now at the UVJ. The stone measures approximately 0.4 cm. 1.8 cm nonobstructing stone upper pole right kidney. Fatty infiltration of the liver. Mild sigmoid diverticulosis scratch the sigmoid diverticulosis without diverticulitis. Atherosclerosis. Status post cholecystectomy. Electronically Signed   By: Drusilla Kanner M.D.   On: 09/27/2015 18:29    CT a/p (stone protocol 09/12/15)-76mm LEFT mid ureteral calculus as well as 1.4 LEFT upper pole calculus  CT a/p (stone protocol 09/27/15)-5 mm UVJ stone with hydronephrosis and periureteral stranding as well as LEFT upper pole 1.4 cm calculus  I independently reviewed the above imaging studies.  Impression/Recommendations: 71yM with history of nephrolithiasis as well excessive belching and obstructing ~21mm UVJ stone as well as large stone in the upper pole of the LEFT kidney ~1.4 cm in the setting of fever to 102.5, hydronephrosis,  Presumed UTI, leukocytosis therefore meeting SIRS criteria with presumed urinary source.  We discussed the need for emergent stent placement to drain the infected urine and alleviate the obstruction.  The purpose is to drain infection and the stone will not be removed. It is far too dangerous to remove the stone with lithotripsy in the setting of infected urine as this would surely result in profound life threatening sepsis.  The stent will remain until the infection is cleared and she is clinically improved while on culture directed antibiotic therapy.  At that point she will be scheduled for an elective stone treatment procedure, either a shock wave lithotripsy, PCNL or ureteroscopic laser lithotripsy. The patient understands all of this and is in agreement.  Will proceed emergently to the  operating room.  Post-operatively patient will be admitted to the Urology service  Will start broad spectrum antibiosis with vancomycin and cefepime, blood and urine cultures pending.  Will receive  an additional  Patient seen and discussed with Dr. Georg Ruddle 09/27/2015, 6:48 PM

## 2015-09-27 NOTE — ED Notes (Signed)
O.R. Has called, and I will take him there shortly.

## 2015-09-27 NOTE — Anesthesia Preprocedure Evaluation (Addendum)
Anesthesia Evaluation  Patient identified by MRN, date of birth, ID band Patient awake    Reviewed: Allergy & Precautions, NPO status , Patient's Chart, lab work & pertinent test results  History of Anesthesia Complications (+) DIFFICULT AIRWAY and history of anesthetic complications (Was told when he had a "Double" tube for drainage of blood in his lung that he was "difficult to intubate" but has had 17 plus anesthetics. He has a neck fusion but is able to extend his neck. Will plan for video laryncoscope and LMA and rescue devices av )  Airway Mallampati: III  TM Distance: >3 FB Neck ROM: Full  Mouth opening: Limited Mouth Opening  Dental no notable dental hx. (+) Dental Advisory Given   Pulmonary former smoker,    Pulmonary exam normal breath sounds clear to auscultation       Cardiovascular negative cardio ROS Normal cardiovascular exam Rhythm:Regular Rate:Normal     Neuro/Psych negative neurological ROS  negative psych ROS   GI/Hepatic negative GI ROS, Neg liver ROS,   Endo/Other  diabetes, Type 2, Oral Hypoglycemic Agents  Renal/GU Renal disease  negative genitourinary   Musculoskeletal  (+) Arthritis ,   Abdominal   Peds negative pediatric ROS (+)  Hematology negative hematology ROS (+)   Anesthesia Other Findings   Reproductive/Obstetrics negative OB ROS                            Anesthesia Physical Anesthesia Plan  ASA: II and emergent  Anesthesia Plan: General   Post-op Pain Management:    Induction: Intravenous, Rapid sequence and Cricoid pressure planned  Airway Management Planned: Oral ETT  Additional Equipment:   Intra-op Plan:   Post-operative Plan: Extubation in OR  Informed Consent: I have reviewed the patients History and Physical, chart, labs and discussed the procedure including the risks, benefits and alternatives for the proposed anesthesia with the  patient or authorized representative who has indicated his/her understanding and acceptance.   Dental advisory given  Plan Discussed with: CRNA  Anesthesia Plan Comments:        Anesthesia Quick Evaluation

## 2015-09-27 NOTE — ED Notes (Signed)
Bed: RESB Expected date:  Expected time:  Means of arrival:  Comments: Hold for [employer]

## 2015-09-27 NOTE — ED Notes (Signed)
Our efforts thus far have been to place large bore IV, obtain labs and CT.  As I write this, Dr. Nunzio CoryLyons (urology) is speaking with pt. And his wife.  Pt. C/o being "cold"; and a warm blanket is provided.

## 2015-09-27 NOTE — Transfer of Care (Signed)
Immediate Anesthesia Transfer of Care Note  Patient: Luanna ColeFrederick M Pasternak  Procedure(s) Performed: Procedure(s): CYSTOSCOPY WITH RETROGRADE PYELOGRAM/URETERAL STENT PLACEMENT (Left)  Patient Location: PACU  Anesthesia Type:General  Level of Consciousness: awake, oriented, patient cooperative, lethargic and responds to stimulation  Airway & Oxygen Therapy: Patient Spontanous Breathing and Patient connected to face mask oxygen  Post-op Assessment: Report given to RN, Post -op Vital signs reviewed and stable and Patient moving all extremities  Post vital signs: Reviewed and stable  Last Vitals:  Filed Vitals:   09/27/15 1746  BP: 136/70  Pulse: 81  Temp: 39.3 C  Resp: 20    Complications: No apparent anesthesia complications

## 2015-09-27 NOTE — Op Note (Signed)
NAME:  Seth Lewis, SHELLER NO.:  [id number]  MEDICAL RECORD NO.:  [id number]  LOCATION:  [facility]                         FACILITY:  [facility]  PHYSICIAN:  Sebastian Ache, MD     DATE OF BIRTH:  January 14, 1946  DATE OF PROCEDURE: 09/27/2015                              OPERATIVE REPORT  DIAGNOSES:  Left ureteral stone, left renal stone with fevers and bacteriuria.  PROCEDURES: 1. Cystoscopy, left retrograde pyelogram, interpretation. 2. Insertion of left ureteral stent, 6 x 26 contour, no tether.  SURGEON:  Sebastian Ache, MD.  ASSISTANT:  Loura Pardon, MD.  ESTIMATED BLOOD LOSS:  Nil.  COMPLICATIONS:  None.  SPECIMEN:  Left renal pelvis urine to Microbiology for gram stain and culture.  FINDINGS: 1. Diffuse erythema and edema of the bladder consistent with likely     urinary tract infection. 2. Very mild left hydronephrosis with left retrograde pyelogram. 3. Successful placement of left ureteral stent, proximal in renal     pelvis, distal in urinary bladder.  INDICATION:  Mr. Gamino is a pleasant 70 year old gentleman with a long history of recurrent nephrolithiasis.  He has most recently had a known left ureteral stone on medical therapy as well as a larger upper pole stone.  He has been on interval medical therapy for ureteral stone with possible consideration of staged ureteroscopy versus percutaneous procedure for his upper pole stone when he developed acute fevers within the last 24 hours.  He has irritative voiding symptoms.  It is worrisome for obstructed stone and that was infected.  CT scan corroborated hydronephrosis with left ureteral steinstrasse for dominant distal stone and several smaller fragments superior to this.  I felt that urgent renal decompression was warranted.  Options were discussed including nephrostomy versus stenting, and he wished to proceed with the latter. Informed consent was signed and placed in the medical record.  PROCEDURE IN  DETAIL:  The patient being Seth Lewis, procedure being left ureteral stent placement was confirmed.  Procedure was carried out. A time-out was performed.  Intravenous antibiotics were administered. General endotracheal anesthesia was introduced.  The patient was placed into a low lithotomy position.  Sterile field was created by prepping the patient's penis, perineum, proximal thighs using iodine x3.  Next, cystourethroscopy was performed using a 23-French rigid cystoscope with 30-degree offset lens.  Inspection of the anterior-posterior urethra revealed some mild bilobar prostatic hypertrophy.  Inspection of urinary bladder revealed diffuse mild erythema and edema consistent with likely cystitis.  Bilateral ureteral orifices were in normal anatomic position. The left ureteral orifice was cannulated with a 6-French end-hole catheter, through which a 0.038 zip wire was advanced to the level of the renal pelvis.  The open-ended catheter was advanced to this level, and very gentle retrograde pyelogram was obtained.  On left retrograde pyelogram, there was a single left collecting system with very mild hydronephrosis.  Great care was taken to avoid pyelosinus backflow which did not occur.  Notably, immediately before retrograde pyelography, a sample of renal pelvis urine was obtained and set aside for pathologic analysis.  The zip wire was once again advanced, and the open-ended catheter was exchanged for a new 6 x 26 contour type stent  which was deployed proximal in renal pelvis and distal in urinary bladder.  The bladder was emptied per cystoscope.  Procedure was then terminated.  The patient tolerated the procedure well.  There were no immediate periprocedural complications.  The patient was taken to the postanesthesia care unit in stable condition with plan for hospital admission on IV antibiotics to verify resolution of his acute infectious parameters before discharge.           ______________________________ Sebastian Acheheodore Jobe Mutch, MD     TM/MEDQ  D:  09/27/2015  T:  09/27/2015  Job:  [id number]

## 2015-09-27 NOTE — ED Notes (Signed)
He c/o persistent left flank pain and now c/o fever today.  The urologist on call phoned one of our PA's, KaylaDorathy Daft and told her that his plan is for us to get him ready for a cystoscopic stone removal procedure asap upon his arrival to E.D.  He is here with several family members.

## 2015-09-27 NOTE — ED Provider Notes (Signed)
CSN: [id number]     Arrival date & time 09/27/15  1719 History   First MD Initiated Contact with Patient 09/27/15 1742     Chief Complaint  Patient presents with  . Flank Pain  . Fever     (Consider location/radiation/quality/duration/timing/severity/associated sxs/prior Treatment) HPI Comments: Patient here complaining of fever and left-sided flank pain. He has had colicky left-sided flank pain times several weeks but the fever began today. No reported altered mental status. Denies any vomiting or diarrhea. No cough or congestion. Denies any hematuria. No rashes to his flank. I spoke with the urologist who states that the patient has a known 1.3 cm stone. The plan is that if the patient has any signs of hydronephrosis on his CAT scan he would need to go to the OR. He is also to be evaluated for possible sepsis.  Patient is a 70 y.o. male presenting with flank pain and fever. The history is provided by the patient.  Flank Pain  Fever   Past Medical History  Diagnosis Date  . Diabetes mellitus without complication (HCC)   . Renal disorder   . Kidney stone on left side    History reviewed. No pertinent past surgical history. No family history on file. Social History  Substance Use Topics  . Smoking status: Former Smoker -- 0.30 packs/day for 12 years    Types: Cigarettes    Quit date: 10/08/1982  . Smokeless tobacco: Never Used  . Alcohol Use: Yes     Comment: occasionally    Review of Systems  Constitutional: Positive for fever.  Genitourinary: Positive for flank pain.  All other systems reviewed and are negative.     Allergies  Review of patient's allergies indicates no known allergies.  Home Medications   Prior to Admission medications   Medication Sig Start Date End Date Taking? Authorizing Provider  albuterol (PROVENTIL HFA;VENTOLIN HFA) 108 (90 BASE) MCG/ACT inhaler Inhale 2 puffs into the lungs every 4 (four) hours as needed for wheezing. Patient not taking:  Reported on 09/12/2015 01/27/13   Duwayne Heck de Marcello Moores, MD  aspirin 81 MG tablet Take 81 mg by mouth daily. Reported on 09/12/2015    Historical Provider, MD  cholecalciferol (VITAMIN D) 1000 UNITS tablet Take 1,000 Units by mouth daily.    Historical Provider, MD  levofloxacin (LEVAQUIN) 500 MG tablet Take 1 tablet (500 mg total) by mouth daily. 01/27/13   Jani Files, MD  metFORMIN (GLUCOPHAGE-XR) 500 MG 24 hr tablet Take 500 mg by mouth 2 (two) times daily.    Historical Provider, MD  metoprolol succinate (TOPROL-XL) 50 MG 24 hr tablet Take 50 mg by mouth daily. Take with or immediately following a meal.    Historical Provider, MD  NIFEdipine (PROCARDIA XL/ADALAT-CC) 60 MG 24 hr tablet Take 60 mg by mouth daily.    Historical Provider, MD  omeprazole (PRILOSEC) 20 MG capsule Take 1 capsule (20 mg total) by mouth daily. 09/12/15   Everlene Farrier, PA-C  ondansetron (ZOFRAN ODT) 4 MG disintegrating tablet Take 1 tablet (4 mg total) by mouth every 8 (eight) hours as needed for nausea or vomiting. 09/12/15   Everlene Farrier, PA-C  oxyCODONE-acetaminophen (PERCOCET/ROXICET) 5-325 MG tablet Take 1-2 tablets by mouth every 6 (six) hours as needed for moderate pain or severe pain. 09/12/15   Everlene Farrier, PA-C  polyethylene glycol powder (GLYCOLAX/MIRALAX) powder Take 17 g by mouth 2 (two) times daily. Until daily soft stools  OTC 09/12/15  Everlene FarrierWilliam Dansie, PA-C  prazosin (MINIPRESS) 1 MG capsule Take 1 mg by mouth at bedtime.    Historical Provider, MD  simvastatin (ZOCOR) 40 MG tablet Take 40 mg by mouth every evening.    Historical Provider, MD  tamsulosin (FLOMAX) 0.4 MG CAPS capsule Take 1 capsule (0.4 mg total) by mouth daily. 09/12/15   Everlene FarrierWilliam Dansie, PA-C  testosterone (ANDROGEL) 50 MG/5GM GEL Place 5 g onto the skin daily.    Historical Provider, MD   BP 136/70 mmHg  Pulse 81  Temp(Src) 102.7 F (39.3 C) (Oral)  Resp 20  SpO2 98% Physical Exam  Constitutional: He is oriented to  person, place, and time. He appears well-developed and well-nourished.  Non-toxic appearance. No distress.  HENT:  Head: Normocephalic and atraumatic.  Eyes: Conjunctivae, EOM and lids are normal. Pupils are equal, round, and reactive to light.  Neck: Normal range of motion. Neck supple. No tracheal deviation present. No thyroid mass present.  Cardiovascular: Normal rate, regular rhythm and normal heart sounds.  Exam reveals no gallop.   No murmur heard. Pulmonary/Chest: Effort normal and breath sounds normal. No stridor. No respiratory distress. He has no decreased breath sounds. He has no wheezes. He has no rhonchi. He has no rales.  Abdominal: Soft. Normal appearance and bowel sounds are normal. He exhibits no distension. There is no tenderness. There is no rigidity, no rebound, no guarding and no CVA tenderness.  Musculoskeletal: Normal range of motion. He exhibits no edema or tenderness.       Arms: Neurological: He is alert and oriented to person, place, and time. He has normal strength. No cranial nerve deficit or sensory deficit. GCS eye subscore is 4. GCS verbal subscore is 5. GCS motor subscore is 6.  Skin: Skin is warm and dry. No abrasion and no rash noted.  Psychiatric: He has a normal mood and affect. His speech is normal and behavior is normal.  Nursing note and vitals reviewed.   ED Course  Procedures (including critical care time) Labs Review Labs Reviewed  CULTURE, BLOOD (ROUTINE X 2)  CULTURE, BLOOD (ROUTINE X 2)  URINE CULTURE  BASIC METABOLIC PANEL  CBC WITH DIFFERENTIAL/PLATELET  URINALYSIS, ROUTINE W REFLEX MICROSCOPIC (NOT AT [facility])  PROTIME-INR  LACTIC ACID, PLASMA  LACTIC ACID, PLASMA  I-STAT CG4 LACTIC ACID, ED    Imaging Review No results found. I have personally reviewed and evaluated these images and lab results as part of my medical decision-making.   EKG Interpretation None      MDM   Final diagnoses:  None    Patient's renal CT noted and  shows obstruction. Patient had been seen by the urology resident and was taken to the or.    Lorre NickAnthony Orvan Papadakis, MD 09/27/15 [phone]

## 2015-09-27 NOTE — H&P (Signed)
Urology H&P   Requesting Provider:  Freida BusmanAllen, MD  Reason for Consult:  Nephrolithiasis and fever  HPI:  69yM we are seeing in consultation at the request of Dr. Freida BusmanAllen for management of nephrolithiasis in the setting of fever and altered mental status. Patient has prior history of nephrolithiasis (s/p RIGHT PCNL complicated by pleural injury) and is followed by Dr. Patsi Searsannenbaum. He has a known 1.4 cm upper pole calculus as well as a 5 mm mid ureteral calculus diagnosed on CT scan 09/12/15. He has been on medical expulsive therapy and feels as though he has passed the distal stone. He has been having worsening LEFT flank They [facility] with 5 days of LEFT flank pain with radiation to the back and fever to 102.7 in the setting of known nephrolithiasis. Pain has been constant and is an 8/10. They endorse/deny symptoms of dysuria, urgency, frequency and gross hematuria. Upon arrival to the ED vital signs were notable for a temperature of 102.7, normotensive, non-tachycardic.   He last ate a "few bites of spaghetti with marinara sauce" at 2 pm this afternoon.  Serum labs have been reviewed and are significant for leukocytosis to 18,000. Creatinine of 1.4 from baseline of 1.2. Bicarb is 19, Lactate is 1.91 Urinalysis was obtained via clean catch and is pending.  CT imaging was obtained in the ED that demonstrated a 5 mm obstructing stone at the LEFT UVJ (as well as non-obstructing stones in the LEFT upper pole measuring ~1.4cm).  The patient is a diabetic. He does have a history of sever belching which has been treated and is now resolved.  Patient does have a Insurance underwriterUrologist Patsi Sears(Tannenbaum). Patient was last seen .  He has a history of nephrolithiasis.  Past Medical History  Diagnosis Date  . Diabetes mellitus without complication (HCC)   . Renal disorder   . Kidney stone on left side     History reviewed. No pertinent past surgical history.  Current Hospital Medications:  Home Meds:     Medication List    ASK your doctor about these medications        albuterol 108 (90 Base) MCG/ACT inhaler  Commonly known as:  PROVENTIL HFA;VENTOLIN HFA  Inhale 2 puffs into the lungs every 4 (four) hours as needed for wheezing.     aspirin 81 MG tablet  Take 81 mg by mouth daily. Reported on 09/12/2015     cholecalciferol 1000 units tablet  Commonly known as:  VITAMIN D  Take 1,000 Units by mouth daily.     levofloxacin 500 MG tablet  Commonly known as:  LEVAQUIN  Take 1 tablet (500 mg total) by mouth daily.     metFORMIN 500 MG 24 hr tablet  Commonly known as:  GLUCOPHAGE-XR  Take 500 mg by mouth 2 (two) times daily.     metoprolol succinate 50 MG 24 hr tablet  Commonly known as:  TOPROL-XL  Take 50 mg by mouth daily. Take with or immediately following a meal.     NIFEdipine 60 MG 24 hr tablet  Commonly known as:  PROCARDIA XL/ADALAT-CC  Take 60 mg by mouth daily.     omeprazole 20 MG capsule  Commonly known as:  PRILOSEC  Take 1 capsule (20 mg total) by mouth daily.     ondansetron 4 MG disintegrating tablet  Commonly known as:  ZOFRAN ODT  Take 1 tablet (4 mg total) by mouth every 8 (eight) hours as needed for nausea or vomiting.  oxyCODONE-acetaminophen 5-325 MG tablet  Commonly known as:  PERCOCET/ROXICET  Take 1-2 tablets by mouth every 6 (six) hours as needed for moderate pain or severe pain.     polyethylene glycol powder powder  Commonly known as:  GLYCOLAX/MIRALAX  Take 17 g by mouth 2 (two) times daily. Until daily soft stools  OTC     prazosin 1 MG capsule  Commonly known as:  MINIPRESS  Take 1 mg by mouth at bedtime.     simvastatin 40 MG tablet  Commonly known as:  ZOCOR  Take 40 mg by mouth every evening.     tamsulosin 0.4 MG Caps capsule  Commonly known as:  FLOMAX  Take 1 capsule (0.4 mg total) by mouth daily.     testosterone 50 MG/5GM (1%) Gel  Commonly known as:  ANDROGEL  Place 5 g onto the skin daily.        Scheduled  Meds: Continuous Infusions: PRN Meds:.  Allergies: No Known Allergies  No family history on file.  Social History:  reports that he quit smoking about 32 years ago. His smoking use included Cigarettes. He has a 3.6 pack-year smoking history. He has never used smokeless tobacco. He reports that he drinks alcohol. His drug history is not on file.  ROS: A complete review of systems was performed.  All systems are negative except for pertinent findings as noted.  Physical Exam:  Vital signs in last 24 hours: Temp:  [102.7 F (39.3 C)] 102.7 F (39.3 C) (01/08 1746) Pulse Rate:  [81] 81 (01/08 1746) Resp:  [20] 20 (01/08 1746) BP: (136)/(70) 136/70 mmHg (01/08 1746) SpO2:  [98 %] 98 % (01/08 1746) Constitutional:  Alert and oriented, No acute distress Cardiovascular: Regular rate and rhythm, No JVD Respiratory: Normal respiratory effort, Lungs clear bilaterally GI: Abdomen is soft, nontender, nondistended, no abdominal masses GU: No CVA tenderness Lymphatic: No lymphadenopathy Neurologic: Grossly intact, no focal deficits Psychiatric: Normal mood and affect  Laboratory Data:   Recent Labs  09/27/15 1804  WBC 18.0*  HGB 10.7*  HCT 34.9*  PLT 466*    No results for input(s): NA, K, CL, GLUCOSE, BUN, CALCIUM, CREATININE in the last 72 hours.  Invalid input(s): CO3   Results for orders placed or performed during the hospital encounter of 09/27/15 (from the past 24 hour(s))  CBC with Differential/Platelet     Status: Abnormal   Collection Time: 09/27/15  6:04 PM  Result Value Ref Range   WBC 18.0 (H) 4.0 - 10.5 K/uL   RBC 3.76 (L) 4.22 - 5.81 MIL/uL   Hemoglobin 10.7 (L) 13.0 - 17.0 g/dL   HCT 86.534.9 (L) 78.439.0 - 69.652.0 %   MCV 92.8 78.0 - 100.0 fL   MCH 28.5 26.0 - 34.0 pg   MCHC 30.7 30.0 - 36.0 g/dL   RDW 29.515.2 28.411.5 - 13.215.5 %   Platelets 466 (H) 150 - 400 K/uL   Neutrophils Relative % 86 %   Neutro Abs 15.4 (H) 1.7 - 7.7 K/uL   Lymphocytes Relative 8 %   Lymphs Abs 1.4  0.7 - 4.0 K/uL   Monocytes Relative 6 %   Monocytes Absolute 1.1 (H) 0.1 - 1.0 K/uL   Eosinophils Relative 0 %   Eosinophils Absolute 0.1 0.0 - 0.7 K/uL   Basophils Relative 0 %   Basophils Absolute 0.0 0.0 - 0.1 K/uL  I-Stat CG4 Lactic Acid, ED     Status: None   Collection Time: 09/27/15  6:16 PM  Result Value Ref Range   Lactic Acid, Venous 1.91 0.5 - 2.0 mmol/L   No results found for this or any previous visit (from the past 240 hour(s)).  Renal Function: No results for input(s): CREATININE in the last 168 hours. CrCl cannot be calculated (Unknown ideal weight.).  Radiologic Imaging: Ct Renal Stone Study  09/27/2015  CLINICAL DATA:  Left flank pain.  Fever today.  Initial encounter. EXAM: CT ABDOMEN AND PELVIS WITHOUT CONTRAST TECHNIQUE: Multidetector CT imaging of the abdomen and pelvis was performed following the standard protocol without IV contrast. COMPARISON:  CT abdomen and pelvis 09/12/2015. FINDINGS: Mild dependent atelectasis is present in the lung bases. No pleural or pericardial effusion. There is moderate left hydronephrosis which appears worsened since the prior examination. Previously seen stone in the left ureteral stone which just below the pelvic brim on the prior study has migrated distally and is now at the UVJ. The stone measures approximately 0.4 cm. A nonobstructing 1.8 cm stone is again seen in the upper pole of the left kidney. No other urinary tract stones are identified. Scarring in the lower pole of the right kidney is noted. The patient is status post cholecystectomy. The liver is low attenuating consistent with fatty infiltration. The spleen, adrenal glands and pancreas appear normal. Retroaortic left renal vein is noted. Fat containing right inguinal hernias are identified, larger on the right. There is some sigmoid diverticulosis without diverticulitis. The colon is otherwise unremarkable. The stomach, small bowel and appendix appear normal. Scattered aortoiliac  atherosclerosis without aneurysm is identified. No lytic or sclerotic bony lesion is seen. IMPRESSION: Moderate left hydronephrosis appears worse than on the prior CT scan. Previously seen left ureteral stone has progressed down the ureter is now at the UVJ. The stone measures approximately 0.4 cm. 1.8 cm nonobstructing stone upper pole right kidney. Fatty infiltration of the liver. Mild sigmoid diverticulosis scratch the sigmoid diverticulosis without diverticulitis. Atherosclerosis. Status post cholecystectomy. Electronically Signed   By: Drusilla Kanner M.D.   On: 09/27/2015 18:29    CT a/p (stone protocol 09/12/15)-79mm LEFT mid ureteral calculus as well as 1.4 LEFT upper pole calculus  CT a/p (stone protocol 09/27/15)-5 mm UVJ stone with hydronephrosis and periureteral stranding as well as LEFT upper pole 1.4 cm calculus  I independently reviewed the above imaging studies.  Impression/Recommendations: 71yM with history of nephrolithiasis as well excessive belching and obstructing ~49mm UVJ stone as well as large stone in the upper pole of the LEFT kidney ~1.4 cm in the setting of fever to 102.5, hydronephrosis,  Presumed UTI, leukocytosis therefore meeting SIRS criteria with presumed urinary source.  We discussed the need for emergent stent placement to drain the infected urine and alleviate the obstruction.  The purpose is to drain infection and the stone will not be removed. It is far too dangerous to remove the stone with lithotripsy in the setting of infected urine as this would surely result in profound life threatening sepsis.  The stent will remain until the infection is cleared and she is clinically improved while on culture directed antibiotic therapy.  At that point she will be scheduled for an elective stone treatment procedure, either a shock wave lithotripsy, PCNL or ureteroscopic laser lithotripsy. The patient understands all of this and is in agreement.  Will proceed emergently to the  operating room.  Post-operatively patient will be admitted to the Urology service  Will start broad spectrum antibiosis with vancomycin and cefepime, blood and urine cultures pending.   Patient  seen and discussed with Dr. Georg Ruddle 09/27/2015, 6:48 PM   I have seen and examined the patient and agree with above assesment and plan.  Briefly,  S: Known left ureteral stone on medical therapy with new high-grade fevers concerning for obsructed pyelo. CT with near steinstrasse of distal stones and prox hydro.  O: NAD, T 102.7, WBC 18k RRR CTAB Mild left CVAT No c/c/e  A/P: Discussed rationale of urgent renal decompression and definitive stone management in elective setting. Options for decompression including neph tube (would need to be supracostal to privide possivel direct PCNL access) v. Stent and he opts for trial of JJ stent. Risks, benefits, alternatives discussed as well as need for post-op admission with goal of C&S and afebrile x 24 hrs before discharge.

## 2015-09-28 ENCOUNTER — Encounter (HOSPITAL_COMMUNITY): Payer: Self-pay | Admitting: Urology

## 2015-09-28 DIAGNOSIS — N390 Urinary tract infection, site not specified: Secondary | ICD-10-CM | POA: Diagnosis not present

## 2015-09-28 DIAGNOSIS — R4182 Altered mental status, unspecified: Secondary | ICD-10-CM | POA: Diagnosis not present

## 2015-09-28 DIAGNOSIS — R1032 Left lower quadrant pain: Secondary | ICD-10-CM | POA: Diagnosis not present

## 2015-09-28 DIAGNOSIS — N200 Calculus of kidney: Secondary | ICD-10-CM | POA: Diagnosis not present

## 2015-09-28 DIAGNOSIS — R509 Fever, unspecified: Secondary | ICD-10-CM | POA: Diagnosis not present

## 2015-09-28 DIAGNOSIS — B9620 Unspecified Escherichia coli [E. coli] as the cause of diseases classified elsewhere: Secondary | ICD-10-CM | POA: Diagnosis not present

## 2015-09-28 DIAGNOSIS — N132 Hydronephrosis with renal and ureteral calculous obstruction: Secondary | ICD-10-CM | POA: Diagnosis not present

## 2015-09-28 LAB — BASIC METABOLIC PANEL
Anion gap: 9 (ref 5–15)
BUN: 12 mg/dL (ref 6–20)
CALCIUM: 8.2 mg/dL — AB (ref 8.9–10.3)
CO2: 29 mmol/L (ref 22–32)
CREATININE: 1.34 mg/dL — AB (ref 0.61–1.24)
Chloride: 101 mmol/L (ref 101–111)
GFR calc Af Amer: >60 mL/min (ref >60)
GFR calc non Af Amer: 52 mL/min — ABNORMAL LOW (ref >60)
GLUCOSE: 171 mg/dL — AB (ref 65–99)
Potassium: 3.8 mmol/L (ref 3.5–5.1)
Sodium: 139 mmol/L (ref 135–145)

## 2015-09-28 LAB — CBC
HCT: 31 % — ABNORMAL LOW (ref 39.0–52.0)
HEMOGLOBIN: 9.7 g/dL — AB (ref 13.0–17.0)
MCH: 29 pg (ref 26.0–34.0)
MCHC: 31.3 g/dL (ref 30.0–36.0)
MCV: 92.5 fL (ref 78.0–100.0)
Platelets: 402 10*3/uL — ABNORMAL HIGH (ref 150–400)
RBC: 3.35 MIL/uL — AB (ref 4.22–5.81)
RDW: 15.3 % (ref 11.5–15.5)
WBC: 13.2 10*3/uL — AB (ref 4.0–10.5)

## 2015-09-28 LAB — GLUCOSE, CAPILLARY
Glucose-Capillary: 100 mg/dL — ABNORMAL HIGH (ref 65–99)
Glucose-Capillary: 146 mg/dL — ABNORMAL HIGH (ref 65–99)
Glucose-Capillary: 160 mg/dL — ABNORMAL HIGH (ref 65–99)
Glucose-Capillary: 114 mg/dL — ABNORMAL HIGH (ref 65–99)
Glucose-Capillary: 138 mg/dL — ABNORMAL HIGH (ref 65–99)

## 2015-09-28 MED ORDER — PHENOL 1.4 % MT LIQD
1.0000 | OROMUCOSAL | Status: DC | PRN
  Filled 2015-09-28: qty 177

## 2015-09-28 NOTE — Addendum Note (Signed)
Addendum  created 09/28/15 1550 by Elyn PeersSandra J Kayanna Mckillop, CRNA   Modules edited: Charges VN

## 2015-09-28 NOTE — Anesthesia Postprocedure Evaluation (Signed)
Anesthesia Post Note  Patient: Seth ColeFrederick M Lewis  Procedure(s) Performed: Procedure(s) (LRB): CYSTOSCOPY WITH RETROGRADE PYELOGRAM/URETERAL STENT PLACEMENT (Left)  Anesthesia Type: General Vital Signs Assessment: post-procedure vital signs reviewed and stable Respiratory status: spontaneous breathing, nonlabored ventilation and respiratory function stable Cardiovascular status: blood pressure returned to baseline and stable Postop Assessment: no signs of nausea or vomiting Anesthetic complications: no    Last Vitals:  Filed Vitals:   09/28/15 0355 09/28/15 1037  BP: 127/65 110/60  Pulse: 67 60  Temp: 37.4 C   Resp: 18     Last Pain:  Filed Vitals:   09/28/15 1340  PainSc: 7                  Safa Derner JENNETTE

## 2015-09-28 NOTE — Progress Notes (Signed)
Post-op note  Subjective: The patient is doing well.  No complaints. Tolerating stent without difficulty. Afebrile, hemodynamically stable with exception of short run of tachycardia without hypotension o/n. No pain.  Objective: Vital signs in last 24 hours: Temp:  [98.3 F (36.8 C)-102.7 F (39.3 C)] 99.4 F (37.4 C) (01/09 0355) Pulse Rate:  [62-126] 67 (01/09 0355) Resp:  [13-27] 18 (01/09 0355) BP: (118-136)/(60-72) 127/65 mmHg (01/09 0355) SpO2:  [97 %-100 %] 99 % (01/09 0355) Weight:  [91.627 kg (202 lb)-93.2 kg (205 lb 7.5 oz)] 93.2 kg (205 lb 7.5 oz) (01/08 2113)  Intake/Output from previous day: 01/08 0701 - 01/09 0700 In: 3165.4 [P.O.:480; I.V.:2635.4; IV Piggyback:50] Out: 175 [Urine:175] Intake/Output this shift:    Physical Exam:  General: Alert and oriented. Abdomen: Soft, Nondistended. Incisions: Clean and dry. GU: no foley  Lab Results:  Recent Labs  09/27/15 1804 09/28/15 0530  HGB 10.7* 9.7*  HCT 34.9* 31.0*    Assessment/Plan: POD#1, HD 1 s/p LEFT ureteral stent decompression of obstructed, infected LEFT kidney. Hemodynamically stable without additional fever. Still with leukocytosis and AKI but improving.  1) Continue broad spectrum abx until afebrile x24 hours or cultures result 2) Regular diet, continue gentle fluid hydration 3) Sliding scale insulin 4) Will remain in house until afebrile x 24 hours 5) Plan to d/c on abx until definitive stone treatment (likely 2 weeks)       Loura PardonMatthew Lyons 09/28/2015, 7:01 AM    I have seen the patient and agree with above assesment and plan.  Briefly,  S: POD 1 s/p left ureteral stent for infected pyelonephritis  O: Fever curve trending down, UOP excellent  NAD NCAT RRR Non-labored breathing on room air SNTND Improved left CVAT SCD"s in place  UCX 1/8 - pending BCX 1/9 - pending  A/P  Continue current ABX awaiting final CX and afebrile x 24 hrs.

## 2015-09-29 DIAGNOSIS — N200 Calculus of kidney: Secondary | ICD-10-CM | POA: Diagnosis not present

## 2015-09-29 DIAGNOSIS — R509 Fever, unspecified: Secondary | ICD-10-CM | POA: Diagnosis not present

## 2015-09-29 DIAGNOSIS — B9620 Unspecified Escherichia coli [E. coli] as the cause of diseases classified elsewhere: Secondary | ICD-10-CM | POA: Diagnosis not present

## 2015-09-29 DIAGNOSIS — N390 Urinary tract infection, site not specified: Secondary | ICD-10-CM | POA: Diagnosis not present

## 2015-09-29 DIAGNOSIS — R4182 Altered mental status, unspecified: Secondary | ICD-10-CM | POA: Diagnosis not present

## 2015-09-29 DIAGNOSIS — R1032 Left lower quadrant pain: Secondary | ICD-10-CM | POA: Diagnosis not present

## 2015-09-29 DIAGNOSIS — N132 Hydronephrosis with renal and ureteral calculous obstruction: Secondary | ICD-10-CM | POA: Diagnosis not present

## 2015-09-29 LAB — GLUCOSE, CAPILLARY
Glucose-Capillary: 118 mg/dL — ABNORMAL HIGH (ref 65–99)
Glucose-Capillary: 125 mg/dL — ABNORMAL HIGH (ref 65–99)
Glucose-Capillary: 118 mg/dL — ABNORMAL HIGH (ref 65–99)
Glucose-Capillary: 209 mg/dL — ABNORMAL HIGH (ref 65–99)
Glucose-Capillary: 126 mg/dL — ABNORMAL HIGH (ref 65–99)

## 2015-09-29 MED ORDER — DOCUSATE SODIUM 100 MG PO CAPS: 100.0000 mg | ORAL_CAPSULE | Freq: Two times a day (BID) | ORAL | Status: DC

## 2015-09-29 MED ORDER — SODIUM CHLORIDE 0.9 % IV SOLN: 1.0000 g | Freq: Four times a day (QID) | INTRAVENOUS | Status: DC

## 2015-09-29 MED ORDER — HYDROCODONE-ACETAMINOPHEN 5-325 MG PO TABS: 1.0000 | ORAL_TABLET | Freq: Four times a day (QID) | ORAL | Status: DC | PRN

## 2015-09-29 MED ORDER — AMPICILLIN 250 MG PO CAPS
250.0000 mg | ORAL_CAPSULE | Freq: Four times a day (QID) | ORAL | Status: DC
  Administered 2015-09-29: 250 mg via ORAL
  Filled 2015-09-29 (×3): qty 1

## 2015-09-29 MED ORDER — AMPICILLIN 250 MG PO CAPS: 250.0000 mg | ORAL_CAPSULE | Freq: Four times a day (QID) | ORAL | Status: AC

## 2015-09-29 NOTE — Discharge Summary (Signed)
Physician Discharge Summary  Patient ID: Seth Lewis MRN: [id number] DOB/AGE: [birth year] 71 y.o.  Admit date: 09/27/2015 Discharge date: 09/29/2015  Admission Diagnoses: Left Ureteral Stone, Urosepsis  Discharge Diagnoses:  Active Problems:   Sepsis (HCC)   Nephrolithiasis   Discharged Condition: good  Hospital Course:   1 - Left Ureteral Stone - s/p urgent left ureteral stent placement on 09/27/15 for obstructing distal stones in setting of new UTI with tachycardia / sepsis as per below. Pain controlled and minimal stent colic at discharge.  2- Urosepsis - fevers to 102.2, tachcyardia and bacteruria on admission c/w urosepsis. Placed on empiric ABX per-op 09/27/15 and in house. UCX 1/8 with enterococcus, final sensitivites pending at discharge. By afternoon of 1/10 he has been afebrile x 24 hours.   By 09/29/15, the day of dicharge, he has cleared his systemic infectious parameters, is afebrile x 24 hrs, and felt to be adequate for discharge.  Consults: None  Significant Diagnostic Studies: labs: as per above  Treatments: surgery:  left ureteral stent placement on 09/27/15   Discharge Exam: Blood pressure 129/71, pulse 57, temperature 98.7 F (37.1 C), temperature source Oral, resp. rate 20, height 6\' 1"  (1.854 m), weight 93.2 kg (205 lb 7.5 oz), SpO2 98 %. General appearance: alert, cooperative, appears stated age and at baseline Eyes: negative Nose: Nares normal. Septum midline. Mucosa normal. No drainage or sinus tenderness. Throat: lips, mucosa, and tongue normal; teeth and gums normal Neck: supple, symmetrical, trachea midline Back: symmetric, no curvature. ROM normal. No CVA tenderness. Resp: non-labored on room air Cardio: Nl rate GI: soft, non-tender; bowel sounds normal; no masses,  no organomegaly Male genitalia: normal Extremities: extremities normal, atraumatic, no cyanosis or edema Skin: Skin color, texture, turgor normal. No rashes or lesions Lymph nodes:  Cervical, supraclavicular, and axillary nodes normal. Neurologic: Grossly normal  Disposition: 01-Home or Self Care     Medication List    TAKE these medications        albuterol 108 (90 Base) MCG/ACT inhaler  Commonly known as:  PROVENTIL HFA;VENTOLIN HFA  Inhale 2 puffs into the lungs every 4 (four) hours as needed for wheezing.     ampicillin 250 MG capsule  Commonly known as:  PRINCIPEN  Take 1 capsule (250 mg total) by mouth 4 (four) times daily.     aspirin 81 MG tablet  Take 81 mg by mouth daily. Reported on 09/12/2015     cholecalciferol 1000 units tablet  Commonly known as:  VITAMIN D  Take 1,000 Units by mouth daily.     docusate sodium 100 MG capsule  Commonly known as:  COLACE  Take 1 capsule (100 mg total) by mouth 2 (two) times daily.     HYDROcodone-acetaminophen 5-325 MG tablet  Commonly known as:  NORCO/VICODIN  Take 1-2 tablets by mouth every 6 (six) hours as needed.     metFORMIN 500 MG 24 hr tablet  Commonly known as:  GLUCOPHAGE-XR  Take 500 mg by mouth 2 (two) times daily.     metoCLOPramide 5 MG tablet  Commonly known as:  REGLAN  Take 5 mg by mouth every 8 (eight) hours as needed (belching).     metoprolol succinate 50 MG 24 hr tablet  Commonly known as:  TOPROL-XL  Take 25 mg by mouth daily. Take with or immediately following a meal.     NIFEdipine 60 MG 24 hr tablet  Commonly known as:  PROCARDIA XL/ADALAT-CC  Take 60 mg by mouth daily.  omeprazole 20 MG capsule  Commonly known as:  PRILOSEC  Take 1 capsule (20 mg total) by mouth daily.     ondansetron 4 MG disintegrating tablet  Commonly known as:  ZOFRAN ODT  Take 1 tablet (4 mg total) by mouth every 8 (eight) hours as needed for nausea or vomiting.     oxyCODONE-acetaminophen 5-325 MG tablet  Commonly known as:  PERCOCET/ROXICET  Take 1-2 tablets by mouth every 6 (six) hours as needed for moderate pain or severe pain.     polyethylene glycol powder powder  Commonly known  as:  GLYCOLAX/MIRALAX  Take 17 g by mouth 2 (two) times daily. Until daily soft stools  OTC     prazosin 1 MG capsule  Commonly known as:  MINIPRESS  Take 1 mg by mouth at bedtime.     simvastatin 40 MG tablet  Commonly known as:  ZOCOR  Take 40 mg by mouth every evening.     tamsulosin 0.4 MG Caps capsule  Commonly known as:  FLOMAX  Take 1 capsule (0.4 mg total) by mouth daily.     testosterone 50 MG/5GM (1%) Gel  Commonly known as:  ANDROGEL  Place 5 g onto the skin daily.         SignedSebastian Ache: Rolin Schult 09/29/2015, 5:04 PM

## 2015-09-29 NOTE — Discharge Instructions (Signed)
1 - You may have urinary urgency (bladder spasms) and bloody urine on / off with stent in place. This is normal. ° °2 - Call MD or go to ER for fever >102, severe pain / nausea / vomiting not relieved by medications, or acute change in medical status ° °

## 2015-09-30 DIAGNOSIS — M25511 Pain in right shoulder: Secondary | ICD-10-CM | POA: Diagnosis not present

## 2015-09-30 DIAGNOSIS — Z96611 Presence of right artificial shoulder joint: Secondary | ICD-10-CM | POA: Diagnosis not present

## 2015-09-30 DIAGNOSIS — M6281 Muscle weakness (generalized): Secondary | ICD-10-CM | POA: Diagnosis not present

## 2015-09-30 DIAGNOSIS — M19011 Primary osteoarthritis, right shoulder: Secondary | ICD-10-CM | POA: Diagnosis not present

## 2015-09-30 LAB — URINE CULTURE: Culture: 50000

## 2015-10-03 LAB — CULTURE, BLOOD (ROUTINE X 2)
CULTURE: NO GROWTH
Culture: NO GROWTH

## 2015-10-06 DIAGNOSIS — M25511 Pain in right shoulder: Secondary | ICD-10-CM | POA: Diagnosis not present

## 2015-10-06 DIAGNOSIS — M6281 Muscle weakness (generalized): Secondary | ICD-10-CM | POA: Diagnosis not present

## 2015-10-06 DIAGNOSIS — M19011 Primary osteoarthritis, right shoulder: Secondary | ICD-10-CM | POA: Diagnosis not present

## 2015-10-06 DIAGNOSIS — Z96611 Presence of right artificial shoulder joint: Secondary | ICD-10-CM | POA: Diagnosis not present

## 2015-10-07 DIAGNOSIS — N200 Calculus of kidney: Secondary | ICD-10-CM | POA: Diagnosis not present

## 2015-10-12 ENCOUNTER — Other Ambulatory Visit: Payer: Self-pay | Admitting: Urology

## 2015-10-12 DIAGNOSIS — M6281 Muscle weakness (generalized): Secondary | ICD-10-CM | POA: Diagnosis not present

## 2015-10-12 DIAGNOSIS — M25511 Pain in right shoulder: Secondary | ICD-10-CM | POA: Diagnosis not present

## 2015-10-12 DIAGNOSIS — M19011 Primary osteoarthritis, right shoulder: Secondary | ICD-10-CM | POA: Diagnosis not present

## 2015-10-12 DIAGNOSIS — Z96611 Presence of right artificial shoulder joint: Secondary | ICD-10-CM | POA: Diagnosis not present

## 2015-10-13 DIAGNOSIS — M25511 Pain in right shoulder: Secondary | ICD-10-CM | POA: Diagnosis not present

## 2015-10-13 DIAGNOSIS — Z96611 Presence of right artificial shoulder joint: Secondary | ICD-10-CM | POA: Diagnosis not present

## 2015-10-13 DIAGNOSIS — M19011 Primary osteoarthritis, right shoulder: Secondary | ICD-10-CM | POA: Diagnosis not present

## 2015-10-13 DIAGNOSIS — M6281 Muscle weakness (generalized): Secondary | ICD-10-CM | POA: Diagnosis not present

## 2015-10-14 DIAGNOSIS — M6281 Muscle weakness (generalized): Secondary | ICD-10-CM | POA: Diagnosis not present

## 2015-10-14 DIAGNOSIS — M25511 Pain in right shoulder: Secondary | ICD-10-CM | POA: Diagnosis not present

## 2015-10-14 DIAGNOSIS — Z96611 Presence of right artificial shoulder joint: Secondary | ICD-10-CM | POA: Diagnosis not present

## 2015-10-14 DIAGNOSIS — M19011 Primary osteoarthritis, right shoulder: Secondary | ICD-10-CM | POA: Diagnosis not present

## 2015-10-19 ENCOUNTER — Ambulatory Visit: Payer: Self-pay | Admitting: Neurology

## 2015-10-19 DIAGNOSIS — M6281 Muscle weakness (generalized): Secondary | ICD-10-CM | POA: Diagnosis not present

## 2015-10-19 DIAGNOSIS — M19011 Primary osteoarthritis, right shoulder: Secondary | ICD-10-CM | POA: Diagnosis not present

## 2015-10-19 DIAGNOSIS — Z96611 Presence of right artificial shoulder joint: Secondary | ICD-10-CM | POA: Diagnosis not present

## 2015-10-19 DIAGNOSIS — M25511 Pain in right shoulder: Secondary | ICD-10-CM | POA: Diagnosis not present

## 2015-10-20 DIAGNOSIS — M6281 Muscle weakness (generalized): Secondary | ICD-10-CM | POA: Diagnosis not present

## 2015-10-20 DIAGNOSIS — M19011 Primary osteoarthritis, right shoulder: Secondary | ICD-10-CM | POA: Diagnosis not present

## 2015-10-20 DIAGNOSIS — M25511 Pain in right shoulder: Secondary | ICD-10-CM | POA: Diagnosis not present

## 2015-10-20 DIAGNOSIS — Z96611 Presence of right artificial shoulder joint: Secondary | ICD-10-CM | POA: Diagnosis not present

## 2015-10-22 DIAGNOSIS — M25511 Pain in right shoulder: Secondary | ICD-10-CM | POA: Diagnosis not present

## 2015-10-22 DIAGNOSIS — M19011 Primary osteoarthritis, right shoulder: Secondary | ICD-10-CM | POA: Diagnosis not present

## 2015-10-22 DIAGNOSIS — Z96611 Presence of right artificial shoulder joint: Secondary | ICD-10-CM | POA: Diagnosis not present

## 2015-10-22 DIAGNOSIS — M6281 Muscle weakness (generalized): Secondary | ICD-10-CM | POA: Diagnosis not present

## 2015-10-26 DIAGNOSIS — M6281 Muscle weakness (generalized): Secondary | ICD-10-CM | POA: Diagnosis not present

## 2015-10-26 DIAGNOSIS — M19011 Primary osteoarthritis, right shoulder: Secondary | ICD-10-CM | POA: Diagnosis not present

## 2015-10-26 DIAGNOSIS — M25511 Pain in right shoulder: Secondary | ICD-10-CM | POA: Diagnosis not present

## 2015-10-26 DIAGNOSIS — Z96611 Presence of right artificial shoulder joint: Secondary | ICD-10-CM | POA: Diagnosis not present

## 2015-10-27 ENCOUNTER — Encounter (HOSPITAL_COMMUNITY): Payer: Self-pay | Admitting: *Deleted

## 2015-10-28 NOTE — H&P (Signed)
Reason For Visit F/u to discuss surgery   Active Problems Problems  1. Nephrolithiasis (N20.0)   Assessed By: Jethro Bolus (Urology); Last Assessed: 07 Oct 2015  History of Present Illness     70 yo male returns today to discuss surgery for hx of a Lt ureteral stone. He presented to the ER on 09/27/15 for urosepsis. Dr. Berneice Heinrich did a cystoscopy/Lt JJ stent for hx of a 5mm Lt UVJ stone.     June 2016: yearly f/u with a history of kidney stones, hypogonadism (using Androgel 1.62% 3 pumps daily, but not sure that it is effective) and osteopenia (last Dexa scan was 08/19/11). He has been doing well and hasn't had any current issues w/ kidney stones. He does have nocturia x 1, with a history of kidney stones, as well as a history of a heart murmur, elevated cholesterol, hypertension, hyperthyroidism, arthritis, and sleep apnea. The patient has had a CT scan on 11/05, which shows a 5 mm nonobstructing right renal calculus with no hydronephrosis and no hydroureter. There is no left-sided stone noted. The patient is quite physically active, and has been encouraged to drink large amounts of water to, "flush" his system through. He has previously failed Testim.     02/20/15 labs: PSA - 1.44 and Testosterone - 517  12/25/13 labs: PSA - 1.59 and Testosterone - 301  12/21/12 Testosterone - 322  08/15/12 labs: PSA - 1.92 and Testosterone - 281  08/10/11 labs: PSA - 1.35 and Testosterone - 292.78   07/21/10 labs: PSA - 1.26 and Testosterone - 369.27   Past Medical History Problems  1. History of Arthritis 2. History of Flank Pain Quality Sharp 3. History of hypercholesterolemia (Z86.39) 4. History of hypertension (Z86.79) 5. History of kidney stones (Z87.442) 6. History of sleep apnea (Z86.69) 7. History of Hyperthyroidism (E05.90) 8. History of Murmur (R01.1)  Surgical History Problems  1. History of Back Surgery 2. History of Bladder Surgery 3. History of Chest Tube, Rib Resection,  Open Flap Drainage For Empyema 4. History of Cystoscopy With Insertion Of Ureteral Stent Left 5. History of Neck Surgery 6. History of Neck Surgery 7. History of Percutaneous Lithotomy 8. History of Renal Lithotripsy 9. History of Renal Lithotripsy 10. History of Shoulder Arthroplasty Total Shoulder Replacement 11. History of Shoulder Surgery 12. History of Shoulder Surgery  Current Meds 1. Ampicillin 250 MG Oral Capsule;  Therapy: (Recorded:18Jan2017) to Recorded 2. AndroGel Pump 20.25 MG/ACT (1.62%) Transdermal Gel; apply 3 PUMPS TO UPPER  ARMS AND SHOULDER DAILY;  Therapy: 08Apr2011 to (Evaluate:17Feb2017)  Requested for: 30Nov2016; Last  Rx:29Nov2016 Ordered 3. Aspirin 81 MG TABS;  Therapy: (Recorded:05Nov2008) to Recorded 4. Baclofen 10 MG Oral Tablet;  Therapy: (Recorded:20Dec2016) to Recorded 5. Chlorzoxazone TABS;  Therapy: (Recorded:14Sep2010) to Recorded 6. Colace 100 MG Oral Capsule;  Therapy: (Recorded:18Jan2017) to Recorded 7. Glucosamine CAPS;  Therapy: (Recorded:14Sep2010) to Recorded 8. Hydrocodone-Acetaminophen 5-325 MG Oral Tablet;  Therapy: (Recorded:18Jan2017) to Recorded 9. Metoprolol Succinate ER 100 MG Oral Tablet Extended Release 24 Hour;  Therapy: (Recorded:05Nov2008) to Recorded 10. Multiple Vitamin TABS;   Therapy: (Recorded:05Nov2008) to Recorded 11. NIFEdipine 60 MG TBCR;   Therapy: (Recorded:05Nov2008) to Recorded 12. Prazosin HCl - 1 MG Oral Capsule;   Therapy: (Recorded:20Dec2016) to Recorded 13. Sildenafil Citrate 20 MG Oral Tablet; Take 2-5 tablets as needed;   Therapy: 08Jun2016 to (Last Rx:08Jun2016) Ordered 14. Simvastatin 80 MG Oral Tablet;   Therapy: (Recorded:21Nov2012) to Recorded 15. Tamsulosin HCl - 0.4 MG Oral Capsule; Take one tablet PO  qhs;   Therapy: 29Dec2016 to (Evaluate:28Jan2017); Last Rx:29Dec2016 Ordered 16. Vitamin D 65784 UNIT CAPS;   Therapy: (Recorded:14Sep2010) to Recorded 17. Zolpidem Tartrate 5 MG Oral Tablet;    Therapy: (Recorded:08Jun2016) to Recorded  Allergies Medication  1. No Known Drug Allergies  Family History Problems  1. Family history of Acute Myocardial Infarction : Father 2. Family history of Diabetes Mellitus 3. Family history of Family Health Status Number Of Children   1 son, 1 daughter 4. Family history of Father Deceased At Age ___   44 5. Family history of Hypertension : Father 6. Family history of Mother Deceased At Age ___   32 7. Family history of Nephrolithiasis : Father  Social History Problems  1. Alcohol Use (History) 2. Caffeine Use   1 coffee, 3-5 sodas per day 3. Former Smoker   1 ppd x 20 yrs, quit 20 yrs ago 4. Marital History - Currently Married 5. Occupation:   transportation 6. Tobacco Use   occ cigar  Review of Systems Genitourinary, constitutional, skin, eye, otolaryngeal, hematologic/lymphatic, cardiovascular, pulmonary, endocrine, musculoskeletal, gastrointestinal, neurological and psychiatric system(s) were reviewed and pertinent findings if present are noted and are otherwise negative.  Genitourinary: urinary frequency and nocturia.    Vitals Vital Signs [Data Includes: Last 1 Day]  Recorded: 18Jan2017 01:08PM  Blood Pressure: 113 / 71 Temperature: 97.9 F Heart Rate: 60  Physical Exam Constitutional: Well nourished and well developed . No acute distress.  ENT:. The ears and nose are normal in appearance.  Neck: The appearance of the neck is normal and no neck mass is present.  Pulmonary: No respiratory distress and normal respiratory rhythm and effort.  Cardiovascular: Heart rate and rhythm are normal . No peripheral edema.  Abdomen: The abdomen is soft and nontender. No masses are palpated. No CVA tenderness. No hernias are palpable. No hepatosplenomegaly noted.  Rectal: Rectal exam demonstrates normal sphincter tone, no tenderness and no masses. The prostate has no nodularity and is not tender. The left seminal vesicle is  nonpalpable. The right seminal vesicle is nonpalpable. The perineum is normal on inspection.  Genitourinary: Examination of the penis demonstrates no discharge, no masses, no lesions and a normal meatus. The scrotum is without lesions. The right epididymis is palpably normal and non-tender. The left epididymis is palpably normal and non-tender. The right testis is non-tender and without masses. The left testis is non-tender and without masses.  Lymphatics: The femoral and inguinal nodes are not enlarged or tender.  Skin: Normal skin turgor, no visible rash and no visible skin lesions.  Neuro/Psych:. Mood and affect are appropriate.    Results/Data KUB: no ureteral stone: He has a 21mm L renal stone, with 900 HU stone, 7.11mm from stone to edge.     Assessment Assessed  1. Nephrolithiasis (N20.0)  70 yo male with L renal stone, and has passed ureteral stone.   Plan Schedule lithotripsy.   Signatures Electronically signed by : Jethro Bolus, M.D.; Oct 07 2015  2:01PM EST

## 2015-10-29 ENCOUNTER — Ambulatory Visit (HOSPITAL_COMMUNITY)
Admission: RE | Admit: 2015-10-29 | Discharge: 2015-10-29 | Disposition: A | Discharge status: Home / Self Care | Payer: BLUE CROSS/BLUE SHIELD | Source: Ambulatory Visit | Attending: Urology | Admitting: Urology

## 2015-10-29 ENCOUNTER — Encounter (HOSPITAL_COMMUNITY)
Admission: RE | Disposition: A | Discharge status: Home / Self Care | Payer: Self-pay | Source: Ambulatory Visit | Attending: Urology

## 2015-10-29 ENCOUNTER — Encounter (HOSPITAL_COMMUNITY): Payer: Self-pay | Admitting: General Practice

## 2015-10-29 ENCOUNTER — Ambulatory Visit (HOSPITAL_COMMUNITY): Payer: BLUE CROSS/BLUE SHIELD

## 2015-10-29 DIAGNOSIS — M8580 Other specified disorders of bone density and structure, unspecified site: Secondary | ICD-10-CM | POA: Diagnosis not present

## 2015-10-29 DIAGNOSIS — E291 Testicular hypofunction: Secondary | ICD-10-CM | POA: Diagnosis not present

## 2015-10-29 DIAGNOSIS — Z87442 Personal history of urinary calculi: Secondary | ICD-10-CM | POA: Diagnosis not present

## 2015-10-29 DIAGNOSIS — F17290 Nicotine dependence, other tobacco product, uncomplicated: Secondary | ICD-10-CM | POA: Insufficient documentation

## 2015-10-29 DIAGNOSIS — Z79899 Other long term (current) drug therapy: Secondary | ICD-10-CM | POA: Diagnosis not present

## 2015-10-29 DIAGNOSIS — Z7982 Long term (current) use of aspirin: Secondary | ICD-10-CM | POA: Diagnosis not present

## 2015-10-29 DIAGNOSIS — N200 Calculus of kidney: Secondary | ICD-10-CM | POA: Diagnosis not present

## 2015-10-29 DIAGNOSIS — M1990 Unspecified osteoarthritis, unspecified site: Secondary | ICD-10-CM | POA: Diagnosis not present

## 2015-10-29 DIAGNOSIS — I10 Essential (primary) hypertension: Secondary | ICD-10-CM | POA: Diagnosis not present

## 2015-10-29 DIAGNOSIS — E7800 Pure hypercholesterolemia, unspecified: Secondary | ICD-10-CM | POA: Diagnosis not present

## 2015-10-29 DIAGNOSIS — G4730 Sleep apnea, unspecified: Secondary | ICD-10-CM | POA: Diagnosis not present

## 2015-10-29 DIAGNOSIS — Z01818 Encounter for other preprocedural examination: Secondary | ICD-10-CM | POA: Diagnosis not present

## 2015-10-29 HISTORY — DX: Essential (primary) hypertension: I10

## 2015-10-29 LAB — GLUCOSE, CAPILLARY: Glucose-Capillary: 124 mg/dL — ABNORMAL HIGH (ref 65–99)

## 2015-10-29 SURGERY — LITHOTRIPSY, ESWL
Anesthesia: LOCAL | Laterality: Left

## 2015-10-29 MED ORDER — CIPROFLOXACIN HCL 500 MG PO TABS
500.0000 mg | ORAL_TABLET | ORAL | Status: AC
  Administered 2015-10-29: 500 mg via ORAL
  Filled 2015-10-29: qty 1

## 2015-10-29 MED ORDER — DIAZEPAM 5 MG PO TABS
10.0000 mg | ORAL_TABLET | ORAL | Status: AC
  Administered 2015-10-29: 10 mg via ORAL
  Filled 2015-10-29: qty 2

## 2015-10-29 MED ORDER — SODIUM CHLORIDE 0.9 % IV SOLN
INTRAVENOUS | Status: DC
  Administered 2015-10-29: 09:00:00 via INTRAVENOUS

## 2015-10-29 MED ORDER — DIPHENHYDRAMINE HCL 25 MG PO CAPS
25.0000 mg | ORAL_CAPSULE | ORAL | Status: AC
  Administered 2015-10-29: 25 mg via ORAL
  Filled 2015-10-29: qty 1

## 2015-10-29 NOTE — Interval H&P Note (Signed)
History and Physical Interval Note:  10/29/2015 10:05 AM  Seth Lewis  has presented today for surgery, with the diagnosis of LEFT RENAL STONE  The various methods of treatment have been discussed with the patient and family. After consideration of risks, benefits and other options for treatment, the patient has consented to  Procedure(s): LEFT EXTRACORPOREAL SHOCK WAVE LITHOTRIPSY (ESWL) (Left) as a surgical intervention .  The patient's history has been reviewed, patient examined, no change in status, stable for surgery.  I have reviewed the patient's chart and labs.  Questions were answered to the patient's satisfaction.     Okie Bogacz I Caleigha Zale

## 2015-11-02 DIAGNOSIS — M19011 Primary osteoarthritis, right shoulder: Secondary | ICD-10-CM | POA: Diagnosis not present

## 2015-11-02 DIAGNOSIS — Z96611 Presence of right artificial shoulder joint: Secondary | ICD-10-CM | POA: Diagnosis not present

## 2015-11-02 DIAGNOSIS — M25511 Pain in right shoulder: Secondary | ICD-10-CM | POA: Diagnosis not present

## 2015-11-02 DIAGNOSIS — M6281 Muscle weakness (generalized): Secondary | ICD-10-CM | POA: Diagnosis not present

## 2015-11-03 DIAGNOSIS — M19011 Primary osteoarthritis, right shoulder: Secondary | ICD-10-CM | POA: Diagnosis not present

## 2015-11-04 DIAGNOSIS — R9439 Abnormal result of other cardiovascular function study: Secondary | ICD-10-CM | POA: Diagnosis not present

## 2015-11-04 DIAGNOSIS — R001 Bradycardia, unspecified: Secondary | ICD-10-CM | POA: Diagnosis not present

## 2015-11-04 DIAGNOSIS — R5383 Other fatigue: Secondary | ICD-10-CM | POA: Diagnosis not present

## 2015-11-04 DIAGNOSIS — Z01810 Encounter for preprocedural cardiovascular examination: Secondary | ICD-10-CM | POA: Diagnosis not present

## 2015-11-09 DIAGNOSIS — I10 Essential (primary) hypertension: Secondary | ICD-10-CM | POA: Diagnosis not present

## 2015-11-09 DIAGNOSIS — R9439 Abnormal result of other cardiovascular function study: Secondary | ICD-10-CM | POA: Diagnosis not present

## 2015-11-09 DIAGNOSIS — Z01810 Encounter for preprocedural cardiovascular examination: Secondary | ICD-10-CM | POA: Diagnosis not present

## 2015-11-10 DIAGNOSIS — N433 Hydrocele, unspecified: Secondary | ICD-10-CM | POA: Diagnosis not present

## 2015-11-10 DIAGNOSIS — R310 Gross hematuria: Secondary | ICD-10-CM | POA: Diagnosis not present

## 2015-11-10 DIAGNOSIS — N5082 Scrotal pain: Secondary | ICD-10-CM | POA: Diagnosis not present

## 2015-11-10 DIAGNOSIS — N200 Calculus of kidney: Secondary | ICD-10-CM | POA: Diagnosis not present

## 2015-11-19 DIAGNOSIS — N201 Calculus of ureter: Secondary | ICD-10-CM | POA: Diagnosis not present

## 2015-11-19 DIAGNOSIS — N200 Calculus of kidney: Secondary | ICD-10-CM | POA: Diagnosis not present

## 2015-11-20 ENCOUNTER — Other Ambulatory Visit: Payer: Self-pay | Admitting: Urology

## 2015-12-03 DIAGNOSIS — N433 Hydrocele, unspecified: Secondary | ICD-10-CM | POA: Diagnosis not present

## 2015-12-08 ENCOUNTER — Encounter (HOSPITAL_COMMUNITY): Payer: Self-pay

## 2015-12-08 ENCOUNTER — Encounter (HOSPITAL_COMMUNITY)
Admission: RE | Admit: 2015-12-08 | Discharge: 2015-12-08 | Disposition: A | Discharge status: Home / Self Care | Payer: BLUE CROSS/BLUE SHIELD | Source: Ambulatory Visit | Attending: Urology | Admitting: Urology

## 2015-12-08 DIAGNOSIS — Z9989 Dependence on other enabling machines and devices: Secondary | ICD-10-CM | POA: Diagnosis not present

## 2015-12-08 DIAGNOSIS — N200 Calculus of kidney: Secondary | ICD-10-CM | POA: Diagnosis present

## 2015-12-08 DIAGNOSIS — Z87442 Personal history of urinary calculi: Secondary | ICD-10-CM | POA: Diagnosis not present

## 2015-12-08 DIAGNOSIS — Z79899 Other long term (current) drug therapy: Secondary | ICD-10-CM | POA: Diagnosis not present

## 2015-12-08 DIAGNOSIS — Z7984 Long term (current) use of oral hypoglycemic drugs: Secondary | ICD-10-CM | POA: Diagnosis not present

## 2015-12-08 DIAGNOSIS — D649 Anemia, unspecified: Secondary | ICD-10-CM | POA: Diagnosis not present

## 2015-12-08 DIAGNOSIS — Z7982 Long term (current) use of aspirin: Secondary | ICD-10-CM | POA: Diagnosis not present

## 2015-12-08 DIAGNOSIS — N202 Calculus of kidney with calculus of ureter: Secondary | ICD-10-CM | POA: Diagnosis not present

## 2015-12-08 DIAGNOSIS — M1990 Unspecified osteoarthritis, unspecified site: Secondary | ICD-10-CM | POA: Diagnosis not present

## 2015-12-08 DIAGNOSIS — Q648 Other specified congenital malformations of urinary system: Secondary | ICD-10-CM | POA: Diagnosis not present

## 2015-12-08 DIAGNOSIS — I10 Essential (primary) hypertension: Secondary | ICD-10-CM | POA: Diagnosis not present

## 2015-12-08 DIAGNOSIS — Z87891 Personal history of nicotine dependence: Secondary | ICD-10-CM | POA: Diagnosis not present

## 2015-12-08 DIAGNOSIS — E119 Type 2 diabetes mellitus without complications: Secondary | ICD-10-CM | POA: Diagnosis not present

## 2015-12-08 DIAGNOSIS — G4730 Sleep apnea, unspecified: Secondary | ICD-10-CM | POA: Diagnosis not present

## 2015-12-08 DIAGNOSIS — K219 Gastro-esophageal reflux disease without esophagitis: Secondary | ICD-10-CM | POA: Diagnosis not present

## 2015-12-08 HISTORY — DX: Personal history of urinary calculi: Z87.442

## 2015-12-08 LAB — CBC
HCT: 40.4 % (ref 39.0–52.0)
Hemoglobin: 12.7 g/dL — ABNORMAL LOW (ref 13.0–17.0)
MCH: 29.8 pg (ref 26.0–34.0)
MCHC: 31.4 g/dL (ref 30.0–36.0)
MCV: 94.8 fL (ref 78.0–100.0)
PLATELETS: 302 10*3/uL (ref 150–400)
RBC: 4.26 MIL/uL (ref 4.22–5.81)
RDW: 14.6 % (ref 11.5–15.5)
WBC: 6.6 10*3/uL (ref 4.0–10.5)

## 2015-12-08 LAB — BASIC METABOLIC PANEL
ANION GAP: 10 (ref 5–15)
BUN: 14 mg/dL (ref 6–20)
CALCIUM: 9.2 mg/dL (ref 8.9–10.3)
CHLORIDE: 103 mmol/L (ref 101–111)
CO2: 30 mmol/L (ref 22–32)
CREATININE: 1.18 mg/dL (ref 0.61–1.24)
GFR calc Af Amer: >60 mL/min (ref >60)
Glucose, Bld: 134 mg/dL — ABNORMAL HIGH (ref 65–99)
Potassium: 4.4 mmol/L (ref 3.5–5.1)
Sodium: 143 mmol/L (ref 135–145)

## 2015-12-08 NOTE — Pre-Procedure Instructions (Signed)
EKG 11-04-15 with chart

## 2015-12-08 NOTE — Patient Instructions (Signed)
Seth ColeFrederick M Lewis  12/08/2015   Your procedure is scheduled on: 12-10-15  [facility]  [facility] 3rd floor to  Short Stay Center at  1030 AM.  Call this number if you have problems the morning of surgery [phone]   Remember: ONLY 1 PERSON MAY GO WITH YOU TO SHORT STAY TO GET  READY MORNING OF YOUR SURGERY.  Do not eat food or drink liquids :After Midnight.     Take these medicines the morning of surgery with A SIP OF WATER: Metoprolol. Nifedipine. Tamsulosin. Bring cpap/ mask tubing. No Diabetic meds AM of. DO NOT TAKE ANY DIABETIC MEDICATIONS DAY OF YOUR SURGERY                               You may not have any metal on your body including hair pins and              piercings  Do not wear jewelry, make-up, lotions, powders or perfumes, deodorant             Do not wear nail polish.  Do not shave  48 hours prior to surgery.              Men may shave face and neck.   Do not bring valuables to the hospital. [facility] IS NOT             RESPONSIBLE   FOR VALUABLES.  Contacts, dentures or bridgework may not be worn into surgery.  Leave suitcase in the car. After surgery it may be brought to your room.     Patients discharged the day of surgery will not be allowed to drive home.  Name and phone number of your driver: Seth BeechamCynthia -spouse [phone] cell  Special Instructions: N/A              Please read over the following fact sheets you were given: _____________________________________________________________________             [facility] - Preparing for Surgery Before surgery, you can play an important role.  Because skin is not sterile, your skin needs to be as free of germs as possible.  You can reduce the number of germs on your skin by washing with CHG (chlorahexidine gluconate) soap before surgery.  CHG is an antiseptic cleaner which kills germs and bonds with the skin to continue killing germs even after  washing. Please DO NOT use if you have an allergy to CHG or antibacterial soaps.  If your skin becomes reddened/irritated stop using the CHG and inform your nurse when you arrive at Short Stay. Do not shave (including legs and underarms) for at least 48 hours prior to the first CHG shower.  You may shave your face/neck. Please follow these instructions carefully:  1.  Shower with CHG Soap the night before surgery and the  morning of Surgery.  2.  If you choose to wash your hair, wash your hair first as usual with your  normal  shampoo.  3.  After you shampoo, rinse your hair and body thoroughly to remove the  shampoo.                           4.  Use CHG as you would any  other liquid soap.  You can apply chg directly  to the skin and wash                       Gently with a scrungie or clean washcloth.  5.  Apply the CHG Soap to your body ONLY FROM THE NECK DOWN.   Do not use on face/ open                           Wound or open sores. Avoid contact with eyes, ears mouth and genitals (private parts).                       Wash face,  Genitals (private parts) with your normal soap.             6.  Wash thoroughly, paying special attention to the area where your surgery  will be performed.  7.  Thoroughly rinse your body with warm water from the neck down.  8.  DO NOT shower/wash with your normal soap after using and rinsing off  the CHG Soap.                9.  Pat yourself dry with a clean towel.            10.  Wear clean pajamas.            11.  Place clean sheets on your bed the night of your first shower and do not  sleep with pets. Day of Surgery : Do not apply any lotions/deodorants the morning of surgery.  Please wear clean clothes to the hospital/surgery center.  FAILURE TO FOLLOW THESE INSTRUCTIONS MAY RESULT IN THE CANCELLATION OF YOUR SURGERY PATIENT SIGNATURE_________________________________  NURSE  SIGNATURE__________________________________  ________________________________________________________________________

## 2015-12-09 LAB — HEMOGLOBIN A1C
Hgb A1c MFr Bld: 6.2 % — ABNORMAL HIGH (ref 4.8–5.6)
MEAN PLASMA GLUCOSE: 131 mg/dL

## 2015-12-10 ENCOUNTER — Encounter (HOSPITAL_COMMUNITY): Payer: Self-pay | Admitting: *Deleted

## 2015-12-10 ENCOUNTER — Observation Stay (HOSPITAL_COMMUNITY)
Admission: RE | Admit: 2015-12-10 | Discharge: 2015-12-11 | Disposition: A | Discharge status: Home / Self Care | Payer: BLUE CROSS/BLUE SHIELD | Source: Ambulatory Visit | Attending: Urology | Admitting: Urology

## 2015-12-10 ENCOUNTER — Ambulatory Visit (HOSPITAL_COMMUNITY): Payer: BLUE CROSS/BLUE SHIELD | Admitting: Anesthesiology

## 2015-12-10 ENCOUNTER — Encounter (HOSPITAL_COMMUNITY)
Admission: RE | Disposition: A | Discharge status: Home / Self Care | Payer: Self-pay | Source: Ambulatory Visit | Attending: Urology

## 2015-12-10 ENCOUNTER — Ambulatory Visit (HOSPITAL_COMMUNITY): Payer: BLUE CROSS/BLUE SHIELD

## 2015-12-10 DIAGNOSIS — E119 Type 2 diabetes mellitus without complications: Secondary | ICD-10-CM | POA: Diagnosis not present

## 2015-12-10 DIAGNOSIS — Z7982 Long term (current) use of aspirin: Secondary | ICD-10-CM | POA: Insufficient documentation

## 2015-12-10 DIAGNOSIS — Z87891 Personal history of nicotine dependence: Secondary | ICD-10-CM | POA: Insufficient documentation

## 2015-12-10 DIAGNOSIS — Z9989 Dependence on other enabling machines and devices: Secondary | ICD-10-CM | POA: Insufficient documentation

## 2015-12-10 DIAGNOSIS — M1990 Unspecified osteoarthritis, unspecified site: Secondary | ICD-10-CM | POA: Insufficient documentation

## 2015-12-10 DIAGNOSIS — G4730 Sleep apnea, unspecified: Secondary | ICD-10-CM | POA: Insufficient documentation

## 2015-12-10 DIAGNOSIS — I10 Essential (primary) hypertension: Secondary | ICD-10-CM | POA: Diagnosis not present

## 2015-12-10 DIAGNOSIS — Z87442 Personal history of urinary calculi: Secondary | ICD-10-CM | POA: Insufficient documentation

## 2015-12-10 DIAGNOSIS — Z7984 Long term (current) use of oral hypoglycemic drugs: Secondary | ICD-10-CM | POA: Insufficient documentation

## 2015-12-10 DIAGNOSIS — Z79899 Other long term (current) drug therapy: Secondary | ICD-10-CM | POA: Insufficient documentation

## 2015-12-10 DIAGNOSIS — N202 Calculus of kidney with calculus of ureter: Secondary | ICD-10-CM | POA: Diagnosis not present

## 2015-12-10 DIAGNOSIS — K219 Gastro-esophageal reflux disease without esophagitis: Secondary | ICD-10-CM | POA: Insufficient documentation

## 2015-12-10 DIAGNOSIS — D649 Anemia, unspecified: Secondary | ICD-10-CM | POA: Insufficient documentation

## 2015-12-10 DIAGNOSIS — N200 Calculus of kidney: Secondary | ICD-10-CM

## 2015-12-10 DIAGNOSIS — Q648 Other specified congenital malformations of urinary system: Secondary | ICD-10-CM | POA: Insufficient documentation

## 2015-12-10 HISTORY — PX: CYSTOSCOPY/URETEROSCOPY/HOLMIUM LASER: SHX6545

## 2015-12-10 HISTORY — PX: HOLMIUM LASER APPLICATION: SHX5852

## 2015-12-10 HISTORY — PX: NEPHROLITHOTOMY: SHX5134

## 2015-12-10 LAB — GLUCOSE, CAPILLARY
GLUCOSE-CAPILLARY: 112 mg/dL — AB (ref 65–99)
GLUCOSE-CAPILLARY: 150 mg/dL — AB (ref 65–99)
GLUCOSE-CAPILLARY: 107 mg/dL — AB (ref 65–99)
GLUCOSE-CAPILLARY: 263 mg/dL — AB (ref 65–99)

## 2015-12-10 SURGERY — NEPHROLITHOTOMY PERCUTANEOUS
Anesthesia: General | Laterality: Left

## 2015-12-10 MED ORDER — SUCCINYLCHOLINE CHLORIDE 20 MG/ML IJ SOLN
INTRAMUSCULAR | Status: DC | PRN
  Administered 2015-12-10: 100 mg via INTRAVENOUS

## 2015-12-10 MED ORDER — DIPHENHYDRAMINE HCL 50 MG/ML IJ SOLN: 12.5000 mg | Freq: Four times a day (QID) | INTRAMUSCULAR | Status: DC | PRN

## 2015-12-10 MED ORDER — MIDAZOLAM HCL 2 MG/2ML IJ SOLN
INTRAMUSCULAR | Status: AC
  Filled 2015-12-10: qty 2

## 2015-12-10 MED ORDER — PHENAZOPYRIDINE HCL 100 MG PO TABS: 100.0000 mg | ORAL_TABLET | Freq: Three times a day (TID) | ORAL | Status: DC | PRN

## 2015-12-10 MED ORDER — PROPOFOL 10 MG/ML IV BOLUS
INTRAVENOUS | Status: AC
  Filled 2015-12-10: qty 20

## 2015-12-10 MED ORDER — TAMSULOSIN HCL 0.4 MG PO CAPS: 0.4000 mg | ORAL_CAPSULE | Freq: Every day | ORAL | Status: DC

## 2015-12-10 MED ORDER — INSULIN ASPART 100 UNIT/ML ~~LOC~~ SOLN
0.0000 [IU] | Freq: Three times a day (TID) | SUBCUTANEOUS | Status: DC
  Administered 2015-12-11: 2 [IU] via SUBCUTANEOUS

## 2015-12-10 MED ORDER — ONDANSETRON HCL 4 MG/2ML IJ SOLN
INTRAMUSCULAR | Status: DC | PRN
  Administered 2015-12-10: 4 mg via INTRAVENOUS

## 2015-12-10 MED ORDER — LACTATED RINGERS IV SOLN: INTRAVENOUS | Status: DC

## 2015-12-10 MED ORDER — MEPERIDINE HCL 50 MG/ML IJ SOLN
INTRAMUSCULAR | Status: AC
  Filled 2015-12-10: qty 1

## 2015-12-10 MED ORDER — EPHEDRINE SULFATE 50 MG/ML IJ SOLN
INTRAMUSCULAR | Status: DC | PRN
  Administered 2015-12-10: 10 mg via INTRAVENOUS
  Administered 2015-12-10 (×3): 5 mg via INTRAVENOUS

## 2015-12-10 MED ORDER — FENTANYL CITRATE (PF) 250 MCG/5ML IJ SOLN
INTRAMUSCULAR | Status: AC
  Filled 2015-12-10: qty 5

## 2015-12-10 MED ORDER — DIPHENHYDRAMINE HCL 12.5 MG/5ML PO ELIX: 12.5000 mg | ORAL_SOLUTION | Freq: Four times a day (QID) | ORAL | Status: DC | PRN

## 2015-12-10 MED ORDER — ZOLPIDEM TARTRATE 5 MG PO TABS: 5.0000 mg | ORAL_TABLET | Freq: Every evening | ORAL | Status: DC | PRN

## 2015-12-10 MED ORDER — MEPERIDINE HCL 50 MG/ML IJ SOLN
6.2500 mg | INTRAMUSCULAR | Status: DC | PRN
  Administered 2015-12-10: 12.5 mg via INTRAVENOUS

## 2015-12-10 MED ORDER — PANTOPRAZOLE SODIUM 40 MG PO TBEC
40.0000 mg | DELAYED_RELEASE_TABLET | Freq: Every day | ORAL | Status: DC
  Administered 2015-12-10 – 2015-12-11 (×2): 40 mg via ORAL
  Filled 2015-12-10 (×2): qty 1

## 2015-12-10 MED ORDER — CEFTRIAXONE SODIUM 2 G IJ SOLR
2.0000 g | INTRAMUSCULAR | Status: AC
  Administered 2015-12-10: 2 g via INTRAVENOUS

## 2015-12-10 MED ORDER — DEXAMETHASONE SODIUM PHOSPHATE 10 MG/ML IJ SOLN
INTRAMUSCULAR | Status: DC | PRN
  Administered 2015-12-10: 10 mg via INTRAVENOUS

## 2015-12-10 MED ORDER — FENTANYL CITRATE (PF) 100 MCG/2ML IJ SOLN: 25.0000 ug | INTRAMUSCULAR | Status: DC | PRN

## 2015-12-10 MED ORDER — DOCUSATE SODIUM 100 MG PO CAPS
100.0000 mg | ORAL_CAPSULE | Freq: Two times a day (BID) | ORAL | Status: DC
  Administered 2015-12-10 – 2015-12-11 (×2): 100 mg via ORAL
  Filled 2015-12-10 (×3): qty 1

## 2015-12-10 MED ORDER — ACETAMINOPHEN 325 MG PO TABS: 650.0000 mg | ORAL_TABLET | ORAL | Status: DC | PRN

## 2015-12-10 MED ORDER — LACTATED RINGERS IV SOLN
INTRAVENOUS | Status: DC | PRN
  Administered 2015-12-10 (×2): via INTRAVENOUS

## 2015-12-10 MED ORDER — TAMSULOSIN HCL 0.4 MG PO CAPS
0.4000 mg | ORAL_CAPSULE | Freq: Every day | ORAL | Status: DC
  Administered 2015-12-11: 0.4 mg via ORAL
  Filled 2015-12-10: qty 1

## 2015-12-10 MED ORDER — SODIUM CHLORIDE 0.9 % IR SOLN
Status: DC | PRN
  Administered 2015-12-10: 2000 mL

## 2015-12-10 MED ORDER — ONDANSETRON HCL 4 MG/2ML IJ SOLN: 4.0000 mg | Freq: Once | INTRAMUSCULAR | Status: DC | PRN

## 2015-12-10 MED ORDER — MIDAZOLAM HCL 5 MG/5ML IJ SOLN
INTRAMUSCULAR | Status: DC | PRN
  Administered 2015-12-10: 2 mg via INTRAVENOUS

## 2015-12-10 MED ORDER — SIMVASTATIN 40 MG PO TABS
40.0000 mg | ORAL_TABLET | Freq: Every evening | ORAL | Status: DC
  Administered 2015-12-10: 40 mg via ORAL
  Filled 2015-12-10 (×2): qty 1

## 2015-12-10 MED ORDER — OXYCODONE-ACETAMINOPHEN 5-325 MG PO TABS: 1.0000 | ORAL_TABLET | ORAL | Status: DC | PRN

## 2015-12-10 MED ORDER — GLYCOPYRROLATE 0.2 MG/ML IJ SOLN
INTRAMUSCULAR | Status: DC | PRN
  Administered 2015-12-10: 0.2 mg via INTRAVENOUS

## 2015-12-10 MED ORDER — OXYCODONE-ACETAMINOPHEN 5-325 MG PO TABS
1.0000 | ORAL_TABLET | ORAL | Status: DC | PRN
  Administered 2015-12-11: 2 via ORAL
  Filled 2015-12-10: qty 2

## 2015-12-10 MED ORDER — TRAZODONE HCL 50 MG PO TABS
50.0000 mg | ORAL_TABLET | Freq: Every day | ORAL | Status: DC
  Administered 2015-12-10: 50 mg via ORAL
  Filled 2015-12-10: qty 1

## 2015-12-10 MED ORDER — ALBUTEROL SULFATE (2.5 MG/3ML) 0.083% IN NEBU: 3.0000 mL | INHALATION_SOLUTION | RESPIRATORY_TRACT | Status: DC | PRN

## 2015-12-10 MED ORDER — SUGAMMADEX SODIUM 200 MG/2ML IV SOLN
INTRAVENOUS | Status: DC | PRN
  Administered 2015-12-10: 200 mg via INTRAVENOUS

## 2015-12-10 MED ORDER — SUGAMMADEX SODIUM 200 MG/2ML IV SOLN
INTRAVENOUS | Status: AC
  Filled 2015-12-10: qty 2

## 2015-12-10 MED ORDER — FENTANYL CITRATE (PF) 100 MCG/2ML IJ SOLN
INTRAMUSCULAR | Status: DC | PRN
  Administered 2015-12-10: 50 ug via INTRAVENOUS
  Administered 2015-12-10: 100 ug via INTRAVENOUS
  Administered 2015-12-10 (×2): 50 ug via INTRAVENOUS

## 2015-12-10 MED ORDER — LIDOCAINE HCL (CARDIAC) 20 MG/ML IV SOLN
INTRAVENOUS | Status: DC | PRN
  Administered 2015-12-10: 50 mg via INTRAVENOUS

## 2015-12-10 MED ORDER — SODIUM CHLORIDE 0.9% FLUSH: 3.0000 mL | INTRAVENOUS | Status: DC | PRN

## 2015-12-10 MED ORDER — HYDROMORPHONE HCL 1 MG/ML IJ SOLN
0.5000 mg | INTRAMUSCULAR | Status: DC | PRN
  Administered 2015-12-10 (×2): 1 mg via INTRAVENOUS
  Filled 2015-12-10 (×2): qty 1

## 2015-12-10 MED ORDER — ASPIRIN EC 81 MG PO TBEC
81.0000 mg | DELAYED_RELEASE_TABLET | Freq: Every day | ORAL | Status: DC
  Administered 2015-12-10: 81 mg via ORAL
  Filled 2015-12-10: qty 1

## 2015-12-10 MED ORDER — NIFEDIPINE ER 60 MG PO TB24
60.0000 mg | ORAL_TABLET | Freq: Every day | ORAL | Status: DC
  Administered 2015-12-11: 60 mg via ORAL
  Filled 2015-12-10: qty 1

## 2015-12-10 MED ORDER — ROCURONIUM BROMIDE 100 MG/10ML IV SOLN
INTRAVENOUS | Status: DC | PRN
  Administered 2015-12-10: 5 mg via INTRAVENOUS
  Administered 2015-12-10: 35 mg via INTRAVENOUS
  Administered 2015-12-10: 10 mg via INTRAVENOUS

## 2015-12-10 MED ORDER — PROMETHAZINE HCL 25 MG/ML IJ SOLN
INTRAMUSCULAR | Status: AC
  Filled 2015-12-10: qty 1

## 2015-12-10 MED ORDER — SODIUM CHLORIDE 0.9% FLUSH: 3.0000 mL | Freq: Two times a day (BID) | INTRAVENOUS | Status: DC

## 2015-12-10 MED ORDER — SODIUM CHLORIDE 0.9 % IV SOLN
INTRAVENOUS | Status: DC
  Administered 2015-12-10: 18:00:00 via INTRAVENOUS

## 2015-12-10 MED ORDER — SODIUM CHLORIDE 0.9 % IR SOLN
Status: DC | PRN
  Administered 2015-12-10: 6000 mL

## 2015-12-10 MED ORDER — 0.9 % SODIUM CHLORIDE (POUR BTL) OPTIME
TOPICAL | Status: DC | PRN
  Administered 2015-12-10: 1000 mL

## 2015-12-10 MED ORDER — CEFTRIAXONE SODIUM 2 G IJ SOLR
INTRAMUSCULAR | Status: AC
  Filled 2015-12-10: qty 2

## 2015-12-10 MED ORDER — ONDANSETRON HCL 4 MG/2ML IJ SOLN: 4.0000 mg | INTRAMUSCULAR | Status: DC | PRN

## 2015-12-10 MED ORDER — HYOSCYAMINE SULFATE 0.125 MG SL SUBL
0.1250 mg | SUBLINGUAL_TABLET | SUBLINGUAL | Status: DC | PRN
  Filled 2015-12-10: qty 1

## 2015-12-10 MED ORDER — METOPROLOL TARTRATE 50 MG PO TABS
50.0000 mg | ORAL_TABLET | Freq: Two times a day (BID) | ORAL | Status: DC
  Administered 2015-12-10 – 2015-12-11 (×2): 50 mg via ORAL
  Filled 2015-12-10 (×2): qty 1

## 2015-12-10 MED ORDER — PROPOFOL 10 MG/ML IV BOLUS
INTRAVENOUS | Status: DC | PRN
  Administered 2015-12-10: 200 mg via INTRAVENOUS

## 2015-12-10 MED ORDER — PROMETHAZINE HCL 25 MG/ML IJ SOLN
6.2500 mg | INTRAMUSCULAR | Status: AC | PRN
  Administered 2015-12-10 (×2): 6.25 mg via INTRAVENOUS

## 2015-12-10 MED ORDER — SODIUM CHLORIDE 0.9 % IV SOLN: 250.0000 mL | INTRAVENOUS | Status: DC | PRN

## 2015-12-10 SURGICAL SUPPLY — 68 items
APL SKNCLS STERI-STRIP NONHPOA (GAUZE/BANDAGES/DRESSINGS) ×2
BAG URINE DRAINAGE (UROLOGICAL SUPPLIES) ×4 IMPLANT
BASKET STONE NITINOL 3FRX115MB (UROLOGICAL SUPPLIES) IMPLANT
BASKET ZERO TIP NITINOL 2.4FR (BASKET) IMPLANT
BENZOIN TINCTURE PRP APPL 2/3 (GAUZE/BANDAGES/DRESSINGS) ×6 IMPLANT
BLADE SURG 15 STRL LF DISP TIS (BLADE) ×1 IMPLANT
BLADE SURG 15 STRL SS (BLADE) ×3
BSKT STON RTRVL ZERO TP 2.4FR (BASKET)
CATCHER STONE W/TUBE ADAPTER (UROLOGICAL SUPPLIES) IMPLANT
CATH FOLEY 2W COUNCIL 20FR 5CC (CATHETERS) IMPLANT
CATH FOLEY 2WAY SLVR  5CC 16FR (CATHETERS) ×2
CATH FOLEY 2WAY SLVR  5CC 18FR (CATHETERS) ×2
CATH FOLEY 2WAY SLVR 5CC 16FR (CATHETERS) ×1 IMPLANT
CATH FOLEY 2WAY SLVR 5CC 18FR (CATHETERS) ×1 IMPLANT
CATH IMAGER II 65CM (CATHETERS) ×3 IMPLANT
CATH INTERMIT  6FR 70CM (CATHETERS) ×5 IMPLANT
CATH ROBINSON RED A/P 20FR (CATHETERS) IMPLANT
CATH X-FORCE N30 NEPHROSTOMY (TUBING) ×3 IMPLANT
COVER SURGICAL LIGHT HANDLE (MISCELLANEOUS) ×1 IMPLANT
DRAPE C-ARM 42X120 X-RAY (DRAPES) ×3 IMPLANT
DRAPE LAPAROTOMY T 102X78X121 (DRAPES) ×2 IMPLANT
DRAPE LINGEMAN PERC (DRAPES) ×3 IMPLANT
DRAPE SHEET LG 3/4 BI-LAMINATE (DRAPES) ×1 IMPLANT
DRAPE SURG IRRIG POUCH 19X23 (DRAPES) ×3 IMPLANT
DRSG PAD ABDOMINAL 8X10 ST (GAUZE/BANDAGES/DRESSINGS) ×6 IMPLANT
DRSG TEGADERM 8X12 (GAUZE/BANDAGES/DRESSINGS) ×6 IMPLANT
FIBER LASER FLEXIVA 1000 (UROLOGICAL SUPPLIES) IMPLANT
FIBER LASER FLEXIVA 200 (UROLOGICAL SUPPLIES) ×2 IMPLANT
FIBER LASER FLEXIVA 365 (UROLOGICAL SUPPLIES) IMPLANT
FIBER LASER FLEXIVA 550 (UROLOGICAL SUPPLIES) IMPLANT
FIBER LASER TRAC TIP (UROLOGICAL SUPPLIES) ×2 IMPLANT
GAUZE SPONGE 4X4 12PLY STRL (GAUZE/BANDAGES/DRESSINGS) ×3 IMPLANT
GLOVE BIO SURGEON STRL SZ8 (GLOVE) ×9 IMPLANT
GLOVE BIOGEL PI IND STRL 8 (GLOVE) IMPLANT
GLOVE BIOGEL PI INDICATOR 8 (GLOVE)
GOWN STRL REUS W/TWL LRG LVL3 (GOWN DISPOSABLE) ×6 IMPLANT
GOWN STRL REUS W/TWL XL LVL3 (GOWN DISPOSABLE) ×7 IMPLANT
GUIDEWIRE AMPLAZ .035X145 (WIRE) ×3 IMPLANT
GUIDEWIRE ANG ZIPWIRE 038X150 (WIRE) ×2 IMPLANT
GUIDEWIRE STR DUAL SENSOR (WIRE) ×6 IMPLANT
INSERT SILICONE FOR G14464 (MISCELLANEOUS) ×3 IMPLANT
KIT BASIN OR (CUSTOM PROCEDURE TRAY) ×3 IMPLANT
MANIFOLD NEPTUNE II (INSTRUMENTS) ×3 IMPLANT
NDL TROCAR 18X15 ECHO (NEEDLE) IMPLANT
NDL TROCAR 18X20 (NEEDLE) IMPLANT
NEEDLE TROCAR 18X15 ECHO (NEEDLE) IMPLANT
NEEDLE TROCAR 18X20 (NEEDLE) ×3 IMPLANT
NS IRRIG 1000ML POUR BTL (IV SOLUTION) ×1 IMPLANT
PACK CYSTO (CUSTOM PROCEDURE TRAY) ×5 IMPLANT
PROBE LITHOCLAST ULTRA 3.8X403 (UROLOGICAL SUPPLIES) IMPLANT
PROBE PNEUMATIC 1.0MMX570MM (UROLOGICAL SUPPLIES) IMPLANT
SET AMPLATZ RENAL DILATOR (MISCELLANEOUS) IMPLANT
SET IRRIG Y TYPE TUR BLADDER L (SET/KITS/TRAYS/PACK) ×6 IMPLANT
SHEATH ACCESS URETERAL 38CM (SHEATH) ×2 IMPLANT
SHEATH PEELAWAY SET 9 (SHEATH) ×2 IMPLANT
SPONGE LAP 4X18 X RAY DECT (DISPOSABLE) ×3 IMPLANT
STENT CONTOUR 6FRX26X.038 (STENTS) IMPLANT
STENT CONTOUR 6FRX28X.038 (STENTS) ×2 IMPLANT
STONE CATCHER W/TUBE ADAPTER (UROLOGICAL SUPPLIES) IMPLANT
SUT SILK 2 0 30  PSL (SUTURE) ×2
SUT SILK 2 0 30 PSL (SUTURE) ×1 IMPLANT
SYR 20CC LL (SYRINGE) ×6 IMPLANT
SYRINGE 10CC LL (SYRINGE) ×3 IMPLANT
TOWEL NATURAL 10PK STERILE (DISPOSABLE) ×4 IMPLANT
TOWEL OR 17X26 10 PK STRL BLUE (TOWEL DISPOSABLE) ×3 IMPLANT
TUBING CONNECTING 10 (TUBING) ×5 IMPLANT
TUBING CONNECTING 10' (TUBING) ×3
WATER STERILE IRR 1500ML POUR (IV SOLUTION) ×1 IMPLANT

## 2015-12-10 NOTE — Anesthesia Postprocedure Evaluation (Signed)
Anesthesia Post Note  Patient: Seth ColeFrederick M Meinecke  Procedure(s) Performed: Procedure(s) (LRB): ATTEMPTED NEPHROLITHOTOMY PERCUTANEOUS WITH  ACCESS (Left) CYSTOSCOPY/URETEROSCOPY/HOLMIUM LASER / STONE EXTRACTION WITH STENT (Left) HOLMIUM LASER APPLICATION (Left)  Patient location during evaluation: PACU Anesthesia Type: General Level of consciousness: awake and alert Pain management: pain level controlled Vital Signs Assessment: post-procedure vital signs reviewed and stable Respiratory status: spontaneous breathing, nonlabored ventilation, respiratory function stable and patient connected to nasal cannula oxygen Cardiovascular status: blood pressure returned to baseline and stable Postop Assessment: no signs of nausea or vomiting Anesthetic complications: no    Last Vitals:  Filed Vitals:   12/10/15 1630 12/10/15 1641  BP: 145/98 144/76  Pulse: 66 64  Temp:  36.3 C  Resp: 22 18    Last Pain:  Filed Vitals:   12/10/15 1657  PainSc: 9                  Cecile HearingStephen Edward Lysandra Loughmiller

## 2015-12-10 NOTE — H&P (Signed)
Urology Admission H&P  Chief Complaint: Left flank pain  History of Present Illness: [name] is a 69yo with a hx of nephrolithiasis who is s/p L stent placement for a distal L ureteral calculus. He also has a left 2cm upper pole renal calculus. He has severe LUTS with the stent in place  Past Medical History  Diagnosis Date  . Diabetes mellitus without complication (HCC)   . Hypertension   . History of lithotripsy 2008  . Renal disorder   . Kidney stone on left side   . History of kidney stones     Multiple stones- no loss of kidney function  . Sleep apnea     cpap-settings 8   Past Surgical History  Procedure Laterality Date  . Cystoscopy w/ ureteral stent placement Left 09/27/2015    Procedure: CYSTOSCOPY WITH RETROGRADE PYELOGRAM/URETERAL STENT PLACEMENT;  Surgeon: Sebastian Acheheodore Manny, MD;  Location: [facility];  Service: Urology;  Laterality: Left;  . Shoulder surgery Bilateral     x2 rotator cuff  . Cervical fusion      x2  . Cholecystectomy    . Kidney stone surgery Right     '02  . Reverse shoulder arthroplasty Right   . Trigger finger release Right     x2  . Tonsillectomy    . Hydrocele excision      drainage only    Home Medications:  Prescriptions prior to admission  Medication Sig Dispense Refill Last Dose  . albuterol (PROVENTIL HFA;VENTOLIN HFA) 108 (90 BASE) MCG/ACT inhaler Inhale 2 puffs into the lungs every 4 (four) hours as needed for wheezing. (Patient taking differently: Inhale 2 puffs into the lungs every 4 (four) hours as needed for wheezing (not taking at this time). ) 1 Inhaler 0 unknown  . ANDROGEL PUMP 20.25 MG/ACT (1.62%) GEL Apply 1 application topically daily. To shoulders  2 12/07/2015 at am  . aspirin 81 MG tablet Take 81 mg by mouth at bedtime.    2weeks  . cholecalciferol (VITAMIN D) 1000 UNITS tablet Take 1,000 Units by mouth daily.   12/09/2015 at 0730  . metFORMIN (GLUCOPHAGE-XR) 500 MG 24 hr tablet Take 500 mg by mouth 2 (two) times daily.    12/09/2015 at 2000  . metoprolol (LOPRESSOR) 100 MG tablet Take 50 mg by mouth 2 (two) times daily.   12/10/2015 at 0930  . NIFEdipine (PROCARDIA XL/ADALAT-CC) 60 MG 24 hr tablet Take 60 mg by mouth daily.   12/10/2015 at 0930  . simvastatin (ZOCOR) 40 MG tablet Take 40 mg by mouth every evening.   12/09/2015 at 2000  . tamsulosin (FLOMAX) 0.4 MG CAPS capsule Take 1 capsule (0.4 mg total) by mouth daily. 10 capsule 0 12/10/2015 at 0930  . traZODone (DESYREL) 50 MG tablet Take 50 mg by mouth at bedtime.   12/09/2015 at 2000  . docusate sodium (COLACE) 100 MG capsule Take 1 capsule (100 mg total) by mouth 2 (two) times daily. (Patient not taking: Reported on 11/30/2015) 30 capsule 0   . HYDROcodone-acetaminophen (NORCO/VICODIN) 5-325 MG tablet Take 1-2 tablets by mouth every 6 (six) hours as needed. (Patient not taking: Reported on 11/30/2015) 25 tablet 0   . omeprazole (PRILOSEC) 20 MG capsule Take 1 capsule (20 mg total) by mouth daily. (Patient not taking: Reported on 11/30/2015) 15 capsule 0 Past Week at Unknown time  . ondansetron (ZOFRAN ODT) 4 MG disintegrating tablet Take 1 tablet (4 mg total) by mouth every 8 (eight) hours as needed for nausea  or vomiting. (Patient not taking: Reported on 11/30/2015) 10 tablet 0 More than a month at Unknown time  . oxyCODONE-acetaminophen (PERCOCET/ROXICET) 5-325 MG tablet Take 1-2 tablets by mouth every 6 (six) hours as needed for moderate pain or severe pain. (Patient not taking: Reported on 11/30/2015) 12 tablet 0 Past Week at Unknown time  . polyethylene glycol powder (GLYCOLAX/MIRALAX) powder Take 17 g by mouth 2 (two) times daily. Until daily soft stools  OTC (Patient not taking: Reported on 11/30/2015) 250 g 0 unknown   Allergies: No Known Allergies  History reviewed. No pertinent family history. Social History:  reports that he quit smoking about 33 years ago. His smoking use included Cigarettes. He has a 3.6 pack-year smoking history. He has never used  smokeless tobacco. He reports that he drinks alcohol. He reports that he does not use illicit drugs.  Review of Systems  Gastrointestinal: Positive for nausea.  Genitourinary: Positive for dysuria, urgency, frequency, hematuria and flank pain.  All other systems reviewed and are negative.   Physical Exam:  Vital signs in last 24 hours: Temp:  [97.8 F (36.6 C)] 97.8 F (36.6 C) (03/23 1015) Pulse Rate:  [55] 55 (03/23 1015) Resp:  [18] 18 (03/23 1015) BP: (133)/(77) 133/77 mmHg (03/23 1015) SpO2:  [100 %] 100 % (03/23 1015) Weight:  [97.07 kg (214 lb)] 97.07 kg (214 lb) (03/23 1018) Physical Exam  Constitutional: He is oriented to person, place, and time. He appears well-developed and well-nourished.  HENT:  Head: Normocephalic and atraumatic.  Eyes: EOM are normal. Pupils are equal, round, and reactive to light.  Neck: Normal range of motion. No thyromegaly present.  Cardiovascular: Normal rate and regular rhythm.   Respiratory: Effort normal. No respiratory distress.  GI: Soft. He exhibits distension.  Musculoskeletal: Normal range of motion.  Neurological: He is alert and oriented to person, place, and time.  Skin: Skin is warm and dry.  Psychiatric: He has a normal mood and affect. His behavior is normal. Judgment and thought content normal.    Laboratory Data:  Results for orders placed or performed during the hospital encounter of 12/10/15 (from the past 24 hour(s))  Glucose, capillary     Status: Abnormal   Collection Time: 12/10/15 10:18 AM  Result Value Ref Range   Glucose-Capillary 112 (H) 65 - 99 mg/dL   No results found for this or any previous visit (from the past 240 hour(s)). Creatinine:  Recent Labs  12/08/15 0945  CREATININE 1.18   Baseline Creatinine: 1.2  Impression/Assessment:  69yo with left ureteral calculus and left renal calculus  Plan:  The risks/benefits/alternatives to Left ureteroscopic stone extraction and L PCNL was explained to the  patient and he understands and wishes to proceed with surgery  Jayonna Meyering L 12/10/2015, 10:47 AM

## 2015-12-10 NOTE — OR Nursing (Signed)
Stone taken by Dr. McKenzie 

## 2015-12-10 NOTE — Transfer of Care (Signed)
Immediate Anesthesia Transfer of Care Note  Patient: Seth ColeFrederick M Sollers  Procedure(s) Performed: Procedure(s): ATTEMPTED NEPHROLITHOTOMY PERCUTANEOUS WITH  ACCESS (Left) CYSTOSCOPY/URETEROSCOPY/HOLMIUM LASER / STONE EXTRACTION WITH STENT (Left) HOLMIUM LASER APPLICATION (Left)  Patient Location: PACU  Anesthesia Type:General  Level of Consciousness:  sedated, patient cooperative and responds to stimulation  Airway & Oxygen Therapy:Patient Spontanous Breathing and Patient connected to face mask oxgen  Post-op Assessment:  Report given to PACU RN and Post -op Vital signs reviewed and stable  Post vital signs:  Reviewed and stable  Last Vitals:  Filed Vitals:   12/10/15 1015  BP: 133/77  Pulse: 55  Temp: 36.6 C  Resp: 18    Complications: No apparent anesthesia complications

## 2015-12-10 NOTE — Anesthesia Preprocedure Evaluation (Addendum)
Anesthesia Evaluation  Patient identified by MRN, date of birth, ID band Patient awake    Reviewed: Allergy & Precautions, NPO status , Patient's Chart, lab work & pertinent test results, reviewed documented beta blocker date and time   History of Anesthesia Complications (+) DIFFICULT AIRWAY and history of anesthetic complications (Was told when he had a "Double" tube for drainage of blood in his lung that he was "difficult to intubate" but has had 17 plus anesthetics. He has a neck fusion but is able to extend his neck.)  Airway Mallampati: III  TM Distance: >3 FB Neck ROM: Full  Mouth opening: Limited Mouth Opening  Dental  (+) Teeth Intact, Dental Advisory Given   Pulmonary sleep apnea (8cm H2O) and Continuous Positive Airway Pressure Ventilation , former smoker,    Pulmonary exam normal breath sounds clear to auscultation       Cardiovascular Exercise Tolerance: Good hypertension, Pt. on home beta blockers and Pt. on medications (-) angina(-) CAD and (-) Past MI Normal cardiovascular exam Rhythm:Regular Rate:Normal     Neuro/Psych S/p ACDF negative psych ROS   GI/Hepatic Neg liver ROS, GERD  Medicated,  Endo/Other  diabetes, Type 2, Oral Hypoglycemic Agents  Renal/GU Nephrolithiasis s/p L stent placement for a distal L ureteral calculus      Musculoskeletal  (+) Arthritis , Osteoarthritis,    Abdominal   Peds  Hematology  (+) Blood dyscrasia, anemia , 12.7/40.4   Anesthesia Other Findings Day of surgery medications reviewed with the patient.  Reproductive/Obstetrics                          Anesthesia Physical Anesthesia Plan  ASA: II  Anesthesia Plan: General   Post-op Pain Management:    Induction: Intravenous  Airway Management Planned: Oral ETT and Video Laryngoscope Planned  Additional Equipment:   Intra-op Plan:   Post-operative Plan: Extubation in OR  Informed  Consent: I have reviewed the patients History and Physical, chart, labs and discussed the procedure including the risks, benefits and alternatives for the proposed anesthesia with the patient or authorized representative who has indicated his/her understanding and acceptance.   Dental advisory given  Plan Discussed with: CRNA  Anesthesia Plan Comments: (Risks/benefits of general anesthesia discussed with patient including risk of damage to teeth, lips, gum, and tongue, nausea/vomiting, allergic reactions to medications, and the possibility of heart attack, stroke and death.  All patient questions answered.  Patient wishes to proceed.)       Anesthesia Quick Evaluation                                  Anesthesia Evaluation  Patient identified by MRN, date of birth, ID band Patient awake    Reviewed: Allergy & Precautions, NPO status , Patient's Chart, lab work & pertinent test results  History of Anesthesia Complications (+) DIFFICULT AIRWAY and history of anesthetic complications (Was told when he had a "Double" tube for drainage of blood in his lung that he was "difficult to intubate" but has had 17 plus anesthetics. He has a neck fusion but is able to extend his neck. Will plan for video laryncoscope and LMA and rescue devices av )  Airway Mallampati: III  TM Distance: >3 FB Neck ROM: Full  Mouth opening: Limited Mouth Opening  Dental no notable dental hx. (+) Dental Advisory Given   Pulmonary former smoker,  Pulmonary exam normal breath sounds clear to auscultation       Cardiovascular negative cardio ROS Normal cardiovascular exam Rhythm:Regular Rate:Normal     Neuro/Psych negative neurological ROS  negative psych ROS   GI/Hepatic negative GI ROS, Neg liver ROS,   Endo/Other  diabetes, Type 2, Oral Hypoglycemic Agents  Renal/GU Renal disease  negative genitourinary   Musculoskeletal  (+) Arthritis ,   Abdominal   Peds negative pediatric  ROS (+)  Hematology negative hematology ROS (+)   Anesthesia Other Findings   Reproductive/Obstetrics negative OB ROS                            Anesthesia Physical Anesthesia Plan  ASA: II and emergent  Anesthesia Plan: General   Post-op Pain Management:    Induction: Intravenous, Rapid sequence and Cricoid pressure planned  Airway Management Planned: Oral ETT  Additional Equipment:   Intra-op Plan:   Post-operative Plan: Extubation in OR  Informed Consent: I have reviewed the patients History and Physical, chart, labs and discussed the procedure including the risks, benefits and alternatives for the proposed anesthesia with the patient or authorized representative who has indicated his/her understanding and acceptance.   Dental advisory given  Plan Discussed with: CRNA  Anesthesia Plan Comments:        Anesthesia Quick Evaluation

## 2015-12-10 NOTE — Anesthesia Procedure Notes (Signed)
Procedure Name: Intubation Date/Time: 12/10/2015 12:58 PM Performed by: Alaina Donati, Nuala AlphaKRISTOPHER Pre-anesthesia Checklist: Patient identified, Emergency Drugs available, Suction available, Patient being monitored and Timeout performed Patient Re-evaluated:Patient Re-evaluated prior to inductionOxygen Delivery Method: Circle system utilized Preoxygenation: Pre-oxygenation with 100% oxygen Intubation Type: IV induction Ventilation: Mask ventilation without difficulty Laryngoscope Size: Mac and 4 Grade View: Grade III Tube type: Oral Tube size: 7.5 mm Number of attempts: 2 Airway Equipment and Method: Stylet and Video-laryngoscopy Placement Confirmation: ETT inserted through vocal cords under direct vision,  positive ETCO2,  CO2 detector and breath sounds checked- equal and bilateral Secured at: 23 cm Tube secured with: Tape Dental Injury: Teeth and Oropharynx as per pre-operative assessment  Difficulty Due To: Difficulty was anticipated Future Recommendations: Recommend- induction with short-acting agent, and alternative techniques readily available Comments: DL with Mac 4, G3, easy mask, DL with Glidescope G2, easy pass, VSS.

## 2015-12-10 NOTE — Op Note (Signed)
Preoperative diagnosis: Left renal and ureteral calculus Postoperative diagnosis: Same  Procedure: 1 cystoscopy 2. Left retrograde pyelography 3.  Intraoperative fluoroscopy, under one hour, with interpretation 4.  Left ureteroscopic stone manipulation with laser lithotripsy 5.  Left 6 x 28 JJ stent placement  Attending: Cleda MccreedyPatrick Mackenzie  Anesthesia: General  Estimated blood loss: None  Drains: Left 6 x 26 JJ ureteral stent without tether  Specimens: stone for analysis  Antibiotics: rocephin  Findings: left distal ureteral stone and 2cm upper pole renal stone. No hydronephrosis. No masses/lesions in the bladder. Ureteral orifices in normal anatomic location.  Indications: Patient is a 70 year old male with a history of left ureteral and renal stone and who has persistent left flank pain. He currently has an indwelling stent. After discussing treatment options, she decided proceed with left ureteroscopic stone manipulation.  Procedure her in detail: The patient was brought to the operating room and a brief timeout was done to ensure correct patient, correct procedure, correct site.  General anesthesia was administered patient was placed in dorsal lithotomy position.Their genitalia was then prepped and draped in usual sterile fashion.  A rigid 22 French cystoscope was passed in the urethra and the bladder.  Bladder was inspected free masses or lesions.  the ureteral orifices were in the normal orthotopic locations. We removed the stent and advanced a 6 french ureteral catheter up to the renal pelvis. We then repositioned the pateint in the prone position for percutaneous access into the collecting system. We instilled contrast and noted a duplicated system with a narrow infundibulum to access the stone. We determined percutaneous access would not be feasible due to the high risk on pleural violation. We then repositioned the patient in the dorsal lithotomy position. A rigid 22 French  cystoscope was passed in the urethra and the bladder. a 6 french ureteral catheter was then instilled into the left ureteral orifice.  a gentle retrograde was obtained and findings noted above.  we then placed a zip wire through the ureteral catheter and advanced up to the renal pelvis.  we then removed the cystoscope and cannulated the left ureteral orifice with a semirigid ureteroscope.  We encountered a stone in the distal ureter and removed it with a [employer] basket. Once we reached the UPJ a sensor wire was advanced in to the renal pelvis. We then removed the ureteroscope and advanced a 12/14 x38cm access sheath up to the renal pelvis. We then used the flexible ureteroscope to perform nephroscopy. We encountered the stone in the upper pole.  using a 200 nm laser fiber dusted the stone. Once the stone was reduced to dust we then flushed the upper pole and most of the fragments drained from the access sheath.  We then removed the access sheath under direct vision and noted no injury to the ureter.  we then placed a 6 x 26 double-j ureteral stent over the original zip wire. We removed the wire and good coil was noted in the the renal pelvis under fluoroscopy and the bladder under direct vision.     the bladder was then drained and this concluded the procedure which was well tolerated by patient.  Complications: None  Condition: Stable, extubated, transferred to PACU  Plan: Patient is to be discharged home as to follow-up in 2 weeks for stent removal.

## 2015-12-10 NOTE — Brief Op Note (Signed)
12/10/2015  3:27 PM  PATIENT:  Seth ColeFrederick M Lewis  70 y.o. male  PRE-OPERATIVE DIAGNOSIS:  LEFT RENAL  AND URETERAL CALCULUS  POST-OPERATIVE DIAGNOSIS:  LEFT RENAL  AND URETERAL CALCULUS  PROCEDURE:  Procedure(s): NEPHROLITHOTOMY PERCUTANEOUS WITH  ACCESS (Left) CYSTOSCOPY/URETEROSCOPY/HOLMIUM LASER / STONE EXTRACTION (Left) HOLMIUM LASER APPLICATION (Left)  SURGEON:  Surgeon(s) and Role:    * Malen GauzePatrick L Naz Denunzio, MD - Primary  PHYSICIAN ASSISTANT:   ASSISTANTS: none   ANESTHESIA:   general  EBL:  Total I/O In: 1200 [I.V.:1200] Out: 50 [Blood:50]  BLOOD ADMINISTERED:none  DRAINS: left 6 x28 JJ ureteral stent  LOCAL MEDICATIONS USED:  NONE  SPECIMEN:  Source of Specimen:  left upper pole and left ureter  DISPOSITION OF SPECIMEN:  N/A  COUNTS:  YES  TOURNIQUET:  * No tourniquets in log *  DICTATION: .Note written in EPIC  PLAN OF CARE: Discharge to home after PACU  PATIENT DISPOSITION:  PACU - hemodynamically stable.   Delay start of Pharmacological VTE agent (>24hrs) due to surgical blood loss or risk of bleeding: not applicable

## 2015-12-10 NOTE — Discharge Instructions (Signed)
Ureteroscopy, Care After °Refer to this sheet in the next few weeks. These instructions provide you with information on caring for yourself after your procedure. Your health care provider may also give you more specific instructions. Your treatment has been planned according to current medical practices, but problems sometimes occur. Call your health care provider if you have any problems or questions after your procedure.  °WHAT TO EXPECT AFTER THE PROCEDURE  °After your procedure, it is typical to have the following:  °· A burning sensation when you urinate. °· Blood in your urine. °HOME CARE INSTRUCTIONS  °· Only take medicines as directed by your health care provider. Do not take any over-the-counter pain medication unless your health care provider says it is okay. °· Take a warm bath or hold a warm washcloth over your groin to relieve burning. °· Drink enough fluids to keep your urine clear or pale yellow. °¨ Drink two 8-ounce glasses of water every hour for the first 2 hours after you get home. °¨ Continue to drink water often at home. °· You can eat what you usually do. °· Ask your surgeon when you can do your usual activities. °· If you had a tube placed to keep urine flowing (ureteral stent), ask your health care provider when you need to return to have it removed. °· Keep all follow-up appointments. °SEEK MEDICAL CARE IF:  °· You have chills or fever. °· You have burning pain for longer than 24 hours after the procedure. °· You have blood in your urine for longer than 24 hours after the procedure. °SEEK IMMEDIATE MEDICAL CARE IF:  °· You have large amounts of blood or clots in your urine. °· You have very bad pain. °· You have chest pain or trouble breathing. °  °This information is not intended to replace advice given to you by your health care provider. Make sure you discuss any questions you have with your health care provider. °  °Document Released: 09/10/2013 Document Reviewed: 09/10/2013 °Elsevier  Interactive Patient Education ©2016 [employer] ° °

## 2015-12-11 DIAGNOSIS — N202 Calculus of kidney with calculus of ureter: Secondary | ICD-10-CM | POA: Diagnosis not present

## 2015-12-11 DIAGNOSIS — I10 Essential (primary) hypertension: Secondary | ICD-10-CM | POA: Diagnosis not present

## 2015-12-11 DIAGNOSIS — Z9989 Dependence on other enabling machines and devices: Secondary | ICD-10-CM | POA: Diagnosis not present

## 2015-12-11 DIAGNOSIS — G4730 Sleep apnea, unspecified: Secondary | ICD-10-CM | POA: Diagnosis not present

## 2015-12-11 DIAGNOSIS — Z87442 Personal history of urinary calculi: Secondary | ICD-10-CM | POA: Diagnosis not present

## 2015-12-11 DIAGNOSIS — E119 Type 2 diabetes mellitus without complications: Secondary | ICD-10-CM | POA: Diagnosis not present

## 2015-12-11 LAB — GLUCOSE, CAPILLARY
GLUCOSE-CAPILLARY: 121 mg/dL — AB (ref 65–99)
Glucose-Capillary: 138 mg/dL — ABNORMAL HIGH (ref 65–99)

## 2015-12-11 NOTE — Care Management Note (Signed)
Case Management Note  Patient Details  Name: Seth Lewis MRN: [id number] Date of Birth: 04-16-1946  Subjective/Objective:  70 y/o m admitted w/renal calculi, s/p sx. From home.                  Action/Plan:d/c home no needs or orders.   Expected Discharge Date:                  Expected Discharge Plan:  Home/Self Care  In-House Referral:     Discharge planning Services  CM Consult  Post Acute Care Choice:    Choice offered to:     DME Arranged:    DME Agency:     HH Arranged:    HH Agency:     Status of Service:  Completed, signed off  Medicare Important Message Given:    Date Medicare IM Given:    Medicare IM give by:    Date Additional Medicare IM Given:    Additional Medicare Important Message give by:     If discussed at Long Length of Stay Meetings, dates discussed:    Additional Comments:  Lanier ClamMahabir, Deon Duer, RN 12/11/2015, 10:14 AM

## 2015-12-16 ENCOUNTER — Other Ambulatory Visit: Payer: Self-pay | Admitting: Urology

## 2015-12-18 NOTE — Discharge Summary (Signed)
Physician Discharge Summary  Patient ID: Seth Lewis MRN: [id number] DOB/AGE: 11/02/1945 70 y.o.  Admit date: 12/10/2015 Discharge date: 12/11/2015  Admission Diagnoses: Renal calculi Discharge Diagnoses:  Active Problems:   Renal calculi   Discharged Condition: good  Hospital Course: The patient tolerated the procedure well and was transferred to the floor on IV pain meds, IV fluid. On POD#1 pt was started on regular diet and they ambulated in the halls. Prior to discharge the pt was tolerating a regular diet, pain was controlled on PO pain meds, they were ambulating without difficulty, and they had normal bowel function.  Consults: None  Significant Diagnostic Studies: none  Treatments: surgery: ureteroscopic stone extraction with laser  Discharge Exam: Blood pressure 128/69, pulse 69, temperature 98.3 F (36.8 C), temperature source Oral, resp. rate 16, height  (1.854 m), weight 97.07 kg (214 lb), SpO2 100 %. General appearance: alert, cooperative and appears stated age Head: Normocephalic, without obvious abnormality, atraumatic Eyes: conjunctivae/corneas clear. PERRL, EOM's intact. Fundi benign. Resp: clear to auscultation bilaterally GI: soft, non-tender; bowel sounds normal; no masses,  no organomegaly Extremities: extremities normal, atraumatic, no cyanosis or edema Pulses: 2+ and symmetric Neurologic: Grossly normal  Disposition: 01-Home or Self Care     Medication List    STOP taking these medications        albuterol 108 (90 Base) MCG/ACT inhaler  Commonly known as:  PROVENTIL HFA;VENTOLIN HFA     docusate sodium 100 MG capsule  Commonly known as:  COLACE     HYDROcodone-acetaminophen 5-325 MG tablet  Commonly known as:  NORCO/VICODIN     omeprazole 20 MG capsule  Commonly known as:  PRILOSEC     ondansetron 4 MG disintegrating tablet  Commonly known as:  ZOFRAN ODT     polyethylene glycol powder powder  Commonly known as:   GLYCOLAX/MIRALAX      TAKE these medications        ANDROGEL PUMP 20.25 MG/ACT (1.62%) Gel  Generic drug:  Testosterone  Apply 1 application topically daily. To shoulders     aspirin 81 MG tablet  Take 81 mg by mouth at bedtime.     cholecalciferol 1000 units tablet  Commonly known as:  VITAMIN D  Take 1,000 Units by mouth daily.     metFORMIN 500 MG 24 hr tablet  Commonly known as:  GLUCOPHAGE-XR  Take 500 mg by mouth 2 (two) times daily.     metoprolol 100 MG tablet  Commonly known as:  LOPRESSOR  Take 50 mg by mouth 2 (two) times daily.     NIFEdipine 60 MG 24 hr tablet  Commonly known as:  PROCARDIA XL/ADALAT-CC  Take 60 mg by mouth daily.     oxyCODONE-acetaminophen 5-325 MG tablet  Commonly known as:  ROXICET  Take 1 tablet by mouth every 4 (four) hours as needed for severe pain.     phenazopyridine 100 MG tablet  Commonly known as:  PYRIDIUM  Take 1 tablet (100 mg total) by mouth 3 (three) times daily as needed for pain.     simvastatin 40 MG tablet  Commonly known as:  ZOCOR  Take 40 mg by mouth every evening.     tamsulosin 0.4 MG Caps capsule  Commonly known as:  FLOMAX  Take 1 capsule (0.4 mg total) by mouth daily.     traZODone 50 MG tablet  Commonly known as:  DESYREL  Take 50 mg by mouth at bedtime.  SignedMalen Gauze: Hyun Marsalis L 12/18/2015, 12:54 PM

## 2015-12-21 DIAGNOSIS — Z960 Presence of urogenital implants: Secondary | ICD-10-CM | POA: Diagnosis not present

## 2015-12-24 ENCOUNTER — Encounter (HOSPITAL_BASED_OUTPATIENT_CLINIC_OR_DEPARTMENT_OTHER): Payer: Self-pay | Admitting: *Deleted

## 2015-12-24 NOTE — Progress Notes (Signed)
NPO AFTER MN W/ EXCEPTION CLEAR LIQUIDS UNTIL 0800 (NO CREAM/ MILK PRODUCTS).  ARRIVE AT 1230.  CURRENT LAB RESULTS AND EKG IN CHART AND EPIC.  WILL TAKE METOPROLOL  AM DOS W/ SIPS OF WATER.

## 2015-12-28 ENCOUNTER — Encounter (HOSPITAL_BASED_OUTPATIENT_CLINIC_OR_DEPARTMENT_OTHER): Payer: Self-pay | Admitting: Anesthesiology

## 2015-12-28 ENCOUNTER — Ambulatory Visit (HOSPITAL_BASED_OUTPATIENT_CLINIC_OR_DEPARTMENT_OTHER): Payer: BLUE CROSS/BLUE SHIELD | Admitting: Anesthesiology

## 2015-12-28 ENCOUNTER — Ambulatory Visit (HOSPITAL_BASED_OUTPATIENT_CLINIC_OR_DEPARTMENT_OTHER)
Admission: RE | Admit: 2015-12-28 | Discharge: 2015-12-28 | Disposition: A | Discharge status: Home / Self Care | Payer: BLUE CROSS/BLUE SHIELD | Source: Ambulatory Visit | Attending: Urology | Admitting: Urology

## 2015-12-28 ENCOUNTER — Encounter (HOSPITAL_BASED_OUTPATIENT_CLINIC_OR_DEPARTMENT_OTHER)
Admission: RE | Disposition: A | Discharge status: Home / Self Care | Payer: Self-pay | Source: Ambulatory Visit | Attending: Urology

## 2015-12-28 DIAGNOSIS — G4730 Sleep apnea, unspecified: Secondary | ICD-10-CM | POA: Insufficient documentation

## 2015-12-28 DIAGNOSIS — Z7982 Long term (current) use of aspirin: Secondary | ICD-10-CM | POA: Diagnosis not present

## 2015-12-28 DIAGNOSIS — N202 Calculus of kidney with calculus of ureter: Secondary | ICD-10-CM | POA: Diagnosis not present

## 2015-12-28 DIAGNOSIS — I10 Essential (primary) hypertension: Secondary | ICD-10-CM | POA: Diagnosis not present

## 2015-12-28 DIAGNOSIS — Z9989 Dependence on other enabling machines and devices: Secondary | ICD-10-CM | POA: Diagnosis not present

## 2015-12-28 DIAGNOSIS — E119 Type 2 diabetes mellitus without complications: Secondary | ICD-10-CM | POA: Insufficient documentation

## 2015-12-28 DIAGNOSIS — Z7984 Long term (current) use of oral hypoglycemic drugs: Secondary | ICD-10-CM | POA: Diagnosis not present

## 2015-12-28 DIAGNOSIS — Z87891 Personal history of nicotine dependence: Secondary | ICD-10-CM | POA: Diagnosis not present

## 2015-12-28 DIAGNOSIS — Z79899 Other long term (current) drug therapy: Secondary | ICD-10-CM | POA: Insufficient documentation

## 2015-12-28 DIAGNOSIS — N452 Orchitis: Secondary | ICD-10-CM | POA: Diagnosis not present

## 2015-12-28 DIAGNOSIS — Z87442 Personal history of urinary calculi: Secondary | ICD-10-CM | POA: Insufficient documentation

## 2015-12-28 DIAGNOSIS — R109 Unspecified abdominal pain: Secondary | ICD-10-CM | POA: Diagnosis present

## 2015-12-28 DIAGNOSIS — N433 Hydrocele, unspecified: Secondary | ICD-10-CM | POA: Insufficient documentation

## 2015-12-28 HISTORY — DX: Type 2 diabetes mellitus without complications: E11.9

## 2015-12-28 HISTORY — DX: Personal history of other diseases of the respiratory system: Z87.09

## 2015-12-28 HISTORY — DX: Obstructive sleep apnea (adult) (pediatric): Z99.89

## 2015-12-28 HISTORY — PX: HYDROCELE EXCISION: SHX482

## 2015-12-28 HISTORY — DX: Obstructive sleep apnea (adult) (pediatric): G47.33

## 2015-12-28 HISTORY — DX: Hydrocele, unspecified: N43.3

## 2015-12-28 LAB — GLUCOSE, CAPILLARY
GLUCOSE-CAPILLARY: 110 mg/dL — AB (ref 65–99)
Glucose-Capillary: 106 mg/dL — ABNORMAL HIGH (ref 65–99)

## 2015-12-28 SURGERY — HYDROCELECTOMY
Anesthesia: General | Site: Scrotum | Laterality: Right

## 2015-12-28 MED ORDER — MIDAZOLAM HCL 2 MG/2ML IJ SOLN
INTRAMUSCULAR | Status: AC
  Filled 2015-12-28: qty 2

## 2015-12-28 MED ORDER — ONDANSETRON HCL 4 MG/2ML IJ SOLN
INTRAMUSCULAR | Status: AC
Start: 2015-12-28 — End: 2015-12-28
  Filled 2015-12-28: qty 2

## 2015-12-28 MED ORDER — EPHEDRINE SULFATE 50 MG/ML IJ SOLN
INTRAMUSCULAR | Status: DC | PRN
  Administered 2015-12-28: 10 mg via INTRAVENOUS
  Administered 2015-12-28: 15 mg via INTRAVENOUS
  Administered 2015-12-28: 10 mg via INTRAVENOUS
  Administered 2015-12-28: 15 mg via INTRAVENOUS

## 2015-12-28 MED ORDER — ONDANSETRON HCL 4 MG/2ML IJ SOLN
INTRAMUSCULAR | Status: DC | PRN
  Administered 2015-12-28: 4 mg via INTRAVENOUS

## 2015-12-28 MED ORDER — LIDOCAINE HCL (CARDIAC) 20 MG/ML IV SOLN
INTRAVENOUS | Status: AC
  Filled 2015-12-28: qty 5

## 2015-12-28 MED ORDER — SULFAMETHOXAZOLE-TRIMETHOPRIM 800-160 MG PO TABS: 1.0000 | ORAL_TABLET | Freq: Two times a day (BID) | ORAL | Status: DC

## 2015-12-28 MED ORDER — PROPOFOL 10 MG/ML IV BOLUS
INTRAVENOUS | Status: AC
  Filled 2015-12-28: qty 20

## 2015-12-28 MED ORDER — OXYCODONE-ACETAMINOPHEN 5-325 MG PO TABS
1.0000 | ORAL_TABLET | Freq: Once | ORAL | Status: AC
  Administered 2015-12-28: 1 via ORAL
  Filled 2015-12-28: qty 1

## 2015-12-28 MED ORDER — DEXAMETHASONE SODIUM PHOSPHATE 4 MG/ML IJ SOLN
INTRAMUSCULAR | Status: DC | PRN
  Administered 2015-12-28: 10 mg via INTRAVENOUS

## 2015-12-28 MED ORDER — CEFAZOLIN SODIUM-DEXTROSE 2-4 GM/100ML-% IV SOLN
2.0000 g | INTRAVENOUS | Status: AC
  Administered 2015-12-28: 2 g via INTRAVENOUS
  Filled 2015-12-28: qty 100

## 2015-12-28 MED ORDER — PROPOFOL 10 MG/ML IV BOLUS
INTRAVENOUS | Status: DC | PRN
  Administered 2015-12-28: 200 mg via INTRAVENOUS
  Administered 2015-12-28: 20 mg via INTRAVENOUS

## 2015-12-28 MED ORDER — LIDOCAINE HCL (CARDIAC) 20 MG/ML IV SOLN
INTRAVENOUS | Status: DC | PRN
  Administered 2015-12-28: 100 mg via INTRAVENOUS

## 2015-12-28 MED ORDER — MIDAZOLAM HCL 5 MG/5ML IJ SOLN
INTRAMUSCULAR | Status: DC | PRN
  Administered 2015-12-28: 2 mg via INTRAVENOUS

## 2015-12-28 MED ORDER — LACTATED RINGERS IV SOLN
INTRAVENOUS | Status: DC
  Administered 2015-12-28 (×3): via INTRAVENOUS
  Filled 2015-12-28: qty 1000

## 2015-12-28 MED ORDER — CEFAZOLIN SODIUM-DEXTROSE 2-3 GM-% IV SOLR
INTRAVENOUS | Status: AC
  Filled 2015-12-28: qty 50

## 2015-12-28 MED ORDER — ONDANSETRON HCL 4 MG/2ML IJ SOLN
INTRAMUSCULAR | Status: AC
  Filled 2015-12-28: qty 2

## 2015-12-28 MED ORDER — DEXAMETHASONE SODIUM PHOSPHATE 10 MG/ML IJ SOLN
INTRAMUSCULAR | Status: AC
  Filled 2015-12-28: qty 1

## 2015-12-28 MED ORDER — CEFAZOLIN SODIUM 1-5 GM-% IV SOLN
1.0000 g | INTRAVENOUS | Status: DC
  Filled 2015-12-28: qty 50

## 2015-12-28 MED ORDER — OXYCODONE-ACETAMINOPHEN 5-325 MG PO TABS
ORAL_TABLET | ORAL | Status: AC
  Filled 2015-12-28: qty 1

## 2015-12-28 MED ORDER — FENTANYL CITRATE (PF) 100 MCG/2ML IJ SOLN
INTRAMUSCULAR | Status: DC | PRN
  Administered 2015-12-28 (×4): 50 ug via INTRAVENOUS

## 2015-12-28 MED ORDER — FENTANYL CITRATE (PF) 100 MCG/2ML IJ SOLN
INTRAMUSCULAR | Status: AC
  Filled 2015-12-28: qty 2

## 2015-12-28 MED ORDER — EPHEDRINE SULFATE 50 MG/ML IJ SOLN
INTRAMUSCULAR | Status: AC
  Filled 2015-12-28: qty 1

## 2015-12-28 MED ORDER — OXYCODONE-ACETAMINOPHEN 10-325 MG PO TABS
1.0000 | ORAL_TABLET | ORAL | Status: DC | PRN
Start: 2015-12-28 — End: 2016-11-21

## 2015-12-28 MED ORDER — BUPIVACAINE HCL (PF) 0.25 % IJ SOLN
INTRAMUSCULAR | Status: DC | PRN
  Administered 2015-12-28: 10 mL

## 2015-12-28 MED ORDER — KETOROLAC TROMETHAMINE 30 MG/ML IJ SOLN
INTRAMUSCULAR | Status: DC | PRN
Start: 2015-12-28 — End: 2015-12-28
  Administered 2015-12-28: 30 mg via INTRAVENOUS

## 2015-12-28 MED ORDER — KETOROLAC TROMETHAMINE 30 MG/ML IJ SOLN
INTRAMUSCULAR | Status: AC
  Filled 2015-12-28: qty 1

## 2015-12-28 MED ORDER — SODIUM CHLORIDE 0.9 % IR SOLN
Status: DC | PRN
  Administered 2015-12-28: 500 mL

## 2015-12-28 SURGICAL SUPPLY — 53 items
APPLICATOR COTTON TIP 6IN STRL (MISCELLANEOUS) ×1 IMPLANT
BAG URINE DRAINAGE (UROLOGICAL SUPPLIES) IMPLANT
BLADE CLIPPER SURG (BLADE) ×1 IMPLANT
BLADE SURG 15 STRL LF DISP TIS (BLADE) ×1 IMPLANT
BLADE SURG 15 STRL SS (BLADE) ×2
BNDG GAUZE ELAST 4 BULKY (GAUZE/BANDAGES/DRESSINGS) ×1 IMPLANT
COVER BACK TABLE 60X90IN (DRAPES) ×2 IMPLANT
COVER MAYO STAND STRL (DRAPES) ×2 IMPLANT
COVER PROBE W GEL 5X96 (DRAPES) IMPLANT
DISSECTOR ROUND CHERRY 3/8 STR (MISCELLANEOUS) IMPLANT
DRAIN PENROSE 18X1/2 LTX STRL (DRAIN) ×1 IMPLANT
DRAPE LAPAROTOMY 100X72 PEDS (DRAPES) ×2 IMPLANT
DRSG TEGADERM 4X4.75 (GAUZE/BANDAGES/DRESSINGS) IMPLANT
ELECT NDL TIP 2.8 STRL (NEEDLE) ×1 IMPLANT
ELECT NEEDLE TIP 2.8 STRL (NEEDLE) ×2 IMPLANT
ELECT REM PT RETURN 9FT ADLT (ELECTROSURGICAL) ×2
ELECTRODE REM PT RTRN 9FT ADLT (ELECTROSURGICAL) ×1 IMPLANT
GLOVE BIO SURGEON STRL SZ7.5 (GLOVE) ×1 IMPLANT
GLOVE BIO SURGEON STRL SZ8 (GLOVE) ×2 IMPLANT
GLOVE SURG SS PI 7.5 STRL IVOR (GLOVE) ×1 IMPLANT
GOWN STRL REUS W/ TWL LRG LVL3 (GOWN DISPOSABLE) ×1 IMPLANT
GOWN STRL REUS W/ TWL XL LVL3 (GOWN DISPOSABLE) ×1 IMPLANT
GOWN STRL REUS W/TWL LRG LVL3 (GOWN DISPOSABLE) ×2
GOWN STRL REUS W/TWL XL LVL3 (GOWN DISPOSABLE) ×2
KIT ROOM TURNOVER WOR (KITS) ×2 IMPLANT
LIQUID BAND (GAUZE/BANDAGES/DRESSINGS) ×1 IMPLANT
NDL HYPO 25X1 1.5 SAFETY (NEEDLE) ×1 IMPLANT
NEEDLE HYPO 25X1 1.5 SAFETY (NEEDLE) ×2 IMPLANT
NS IRRIG 500ML POUR BTL (IV SOLUTION) ×1 IMPLANT
PACK BASIN DAY SURGERY FS (CUSTOM PROCEDURE TRAY) ×2 IMPLANT
PENCIL BUTTON HOLSTER BLD 10FT (ELECTRODE) ×2 IMPLANT
SUPPORT SCROTAL LG STRP (MISCELLANEOUS) ×2 IMPLANT
SUT CHROMIC 3 0 SH 27 (SUTURE) IMPLANT
SUT CHROMIC 4 0 PS 2 18 (SUTURE) IMPLANT
SUT CHROMIC 4 0 SH 27 (SUTURE) IMPLANT
SUT CHROMIC GUT AB #0 18 (SUTURE) IMPLANT
SUT ETHILON 4 0 PS 2 18 (SUTURE) ×1 IMPLANT
SUT MNCRL AB 4-0 PS2 18 (SUTURE) ×1 IMPLANT
SUT PROLENE 4 0 RB 1 (SUTURE)
SUT PROLENE 4-0 RB1 .5 CRCL 36 (SUTURE) IMPLANT
SUT SILK 0 SH 30 (SUTURE) IMPLANT
SUT SILK 0 TIES 10X30 (SUTURE) IMPLANT
SUT VIC AB 0 SH 27 (SUTURE) IMPLANT
SUT VIC AB 2-0 SH 27 (SUTURE) ×2
SUT VIC AB 2-0 SH 27XBRD (SUTURE) IMPLANT
SUT VICRYL 2 0 18  UND BR (SUTURE)
SUT VICRYL 2 0 18 UND BR (SUTURE) IMPLANT
SYR 30ML LL (SYRINGE) IMPLANT
SYR CONTROL 10ML LL (SYRINGE) ×2 IMPLANT
TOWEL OR 17X24 6PK STRL BLUE (TOWEL DISPOSABLE) ×4 IMPLANT
TRAY DSU PREP LF (CUSTOM PROCEDURE TRAY) ×2 IMPLANT
TUBE CONNECTING 12X1/4 (SUCTIONS) ×1 IMPLANT
YANKAUER SUCT BULB TIP NO VENT (SUCTIONS) ×1 IMPLANT

## 2015-12-28 NOTE — Transfer of Care (Signed)
Last Vitals:  Filed Vitals:   12/28/15 1237  BP: 119/70  Pulse: 62  Temp: 36.7 C  Resp: 18    Immediate Anesthesia Transfer of Care Note  Patient: Seth Lewis  Procedure(s) Performed: Procedure(s) (LRB): HYDROCELECTOMY ADULT (Right)  Patient Location: PACU  Anesthesia Type: General  Level of Consciousness: awake, alert  and oriented  Airway & Oxygen Therapy: Patient Spontanous Breathing and Patient connected to nasal cannula oxygen  Post-op Assessment: Report given to PACU RN and Post -op Vital signs reviewed and stable  Post vital signs: Reviewed and stable  Complications: No apparent anesthesia complications

## 2015-12-28 NOTE — Anesthesia Preprocedure Evaluation (Addendum)
Anesthesia Evaluation  Patient identified by MRN, date of birth, ID band Patient awake    Reviewed: Allergy & Precautions, NPO status , Patient's Chart, lab work & pertinent test results, reviewed documented beta blocker date and time   Airway Mallampati: II  TM Distance: >3 FB Neck ROM: Full    Dental  (+) Teeth Intact, Dental Advisory Given   Pulmonary sleep apnea and Continuous Positive Airway Pressure Ventilation , former smoker,    breath sounds clear to auscultation       Cardiovascular hypertension, Pt. on medications and Pt. on home beta blockers  Rhythm:Regular Rate:Normal     Neuro/Psych negative neurological ROS  negative psych ROS   GI/Hepatic negative GI ROS, Neg liver ROS,   Endo/Other  diabetes, Type 2, Oral Hypoglycemic Agents  Renal/GU Renal disease  negative genitourinary   Musculoskeletal negative musculoskeletal ROS (+)   Abdominal   Peds negative pediatric ROS (+)  Hematology negative hematology ROS (+)   Anesthesia Other Findings   Reproductive/Obstetrics negative OB ROS                          Anesthesia Physical Anesthesia Plan  ASA: II  Anesthesia Plan: General   Post-op Pain Management:    Induction: Intravenous  Airway Management Planned: LMA  Additional Equipment:   Intra-op Plan:   Post-operative Plan: Extubation in OR  Informed Consent: I have reviewed the patients History and Physical, chart, labs and discussed the procedure including the risks, benefits and alternatives for the proposed anesthesia with the patient or authorized representative who has indicated his/her understanding and acceptance.   Dental advisory given  Plan Discussed with: CRNA  Anesthesia Plan Comments: (Based on records, Glidescope likely indicated if ETT plans to be placed.  )      Anesthesia Quick Evaluation

## 2015-12-28 NOTE — Anesthesia Postprocedure Evaluation (Signed)
Anesthesia Post Note  Patient: Seth Lewis  Procedure(s) Performed: Procedure(s) (LRB): HYDROCELECTOMY ADULT (Right)  Patient location during evaluation: PACU Anesthesia Type: General Level of consciousness: sedated Pain management: satisfactory to patient Vital Signs Assessment: post-procedure vital signs reviewed and stable Respiratory status: spontaneous breathing Cardiovascular status: stable Anesthetic complications: no    Last Vitals:  Filed Vitals:   12/28/15 1615 12/28/15 1630  BP: 121/64 116/68  Pulse: 73 66  Temp:    Resp: 18 15    Last Pain:  Filed Vitals:   12/28/15 1631  PainSc: Asleep                 Gabriell Casimir EDWARD

## 2015-12-28 NOTE — Anesthesia Procedure Notes (Signed)
Procedure Name: LMA Insertion Date/Time: 12/28/2015 2:47 PM Performed by: Norva PavlovALLAWAY, Cahlil Sattar G Pre-anesthesia Checklist: Patient identified, Emergency Drugs available, Suction available and Patient being monitored Patient Re-evaluated:Patient Re-evaluated prior to inductionOxygen Delivery Method: Circle System Utilized Preoxygenation: Pre-oxygenation with 100% oxygen Intubation Type: IV induction Ventilation: Mask ventilation without difficulty LMA: LMA inserted LMA Size: 5.0 Number of attempts: 1 Airway Equipment and Method: bite block Placement Confirmation: positive ETCO2 Tube secured with: Tape Dental Injury: Teeth and Oropharynx as per pre-operative assessment

## 2015-12-28 NOTE — Interval H&P Note (Signed)
History and Physical Interval Note:  12/28/2015 2:29 PM  Seth Lewis  has presented today for surgery, with the diagnosis of RIGHT HYDROCELE  The various methods of treatment have been discussed with the patient and family. After consideration of risks, benefits and other options for treatment, the patient has consented to  Procedure(s): HYDROCELECTOMY ADULT (Right) as a surgical intervention .  The patient's history has been reviewed, patient examined, no change in status, stable for surgery.  I have reviewed the patient's chart and labs.  Questions were answered to the patient's satisfaction.     Wilkie AyePatrick Vence Lalor

## 2015-12-28 NOTE — Discharge Instructions (Addendum)
Hydrocelectomy, Care After °Refer to this sheet in the next few weeks. These instructions provide you with information about caring for yourself after your procedure. Your health care provider may also give you more specific instructions. Your treatment has been planned according to current medical practices, but problems sometimes occur. Call your health care provider if you have any problems or questions after your procedure. °WHAT TO EXPECT AFTER THE PROCEDURE °After your procedure, it is common for the pouch that holds your testicles (scrotum) to be painful, swollen, and bruised. °HOME CARE INSTRUCTIONS °Bathing °· Ask your health care provider when you can shower, take baths, or go swimming. °· If you were told to wear an athletic support strap, take it off when you shower or take a bath. °Incision Care °· Follow instructions from your health care provider about how to take care of your incision. Make sure you: °¨ Wash your hands with soap and water before you change your bandage (dressing). If soap and water are not available, use hand sanitizer. °¨ Change your dressing as told by your health care provider. °¨ Leave stitches (sutures) in place. °· Check your incision and scrotum every day for signs of infection. Check for: °¨ More redness, swelling, or pain. °¨ Blood or fluid. °¨ Warmth. °¨ Pus or a bad smell. °Managing Pain, Stiffness, and Swelling °· If directed, apply ice to the injured area: °¨ Put ice in a plastic bag. °¨ Place a towel between your skin and the bag. °¨ Leave the ice on for 20 minutes, 2-3 times per day. °Driving °· Do not drive for 24 hours if you received a sedative. °· Do not drive or operate heavy machinery while taking prescription pain medicine. °· Ask your health care provider when it is safe to drive. °Activity °· Do not do any activities that require great strength and energy (are vigorous) for as long as told by your health care provider. °· Return to your normal activities as  told by your health care provider. Ask your health care provider what activities are safe for you. °· Do not lift anything that is heavier than 10 lb (4.5 kg) until your health care provider says that it is safe. °General Instructions °· Take over-the-counter and prescription medicines only as told by your health care provider. °· Keep all follow-up visits as told by your health care provider. This is important. °· If you were given an athletic support strap, wear it as told by your health care provider. °· If you had a drain put in during the procedure, you will need to return to have it removed. °SEEK MEDICAL CARE IF: °· Your pain gets worse. °· You have more redness, swelling, or pain around your scrotum. °· You have blood or fluid coming from your scrotum. °· Your incision feels warm to the touch. °· You have pus or a bad smell coming from your scrotum. °· You have a fever. °  °This information is not intended to replace advice given to you by your health care provider. Make sure you discuss any questions you have with your health care provider. °  °Document Released: 05/27/2015 Document Reviewed: 03/04/2015 °Elsevier Interactive Patient Education ©2016 [employer] ° ° ° ° °Post Anesthesia Home Care Instructions ° °Activity: °Get plenty of rest for the remainder of the day. A responsible adult should stay with you for 24 hours following the procedure.  °For the next 24 hours, DO NOT: °-Drive a car °-Operate machinery °-Drink alcoholic beverages °-Take   any medication unless instructed by your physician °-Make any legal decisions or sign important papers. ° °Meals: °Start with liquid foods such as gelatin or soup. Progress to regular foods as tolerated. Avoid greasy, spicy, heavy foods. If nausea and/or vomiting occur, drink only clear liquids until the nausea and/or vomiting subsides. Call your physician if vomiting continues. ° °Special Instructions/Symptoms: °Your throat may feel dry or sore from the  anesthesia or the breathing tube placed in your throat during surgery. If this causes discomfort, gargle with warm salt water. The discomfort should disappear within 24 hours. ° °If you had a scopolamine patch placed behind your ear for the management of post- operative nausea and/or vomiting: ° °1. The medication in the patch is effective for 72 hours, after which it should be removed.  Wrap patch in a tissue and discard in the trash. Wash hands thoroughly with soap and water. °2. You may remove the patch earlier than 72 hours if you experience unpleasant side effects which may include dry mouth, dizziness or visual disturbances. °3. Avoid touching the patch. Wash your hands with soap and water after contact with the patch. °  ° °

## 2015-12-28 NOTE — Brief Op Note (Signed)
12/28/2015  3:45 PM  PATIENT:  Seth Lewis  70 y.o. male  PRE-OPERATIVE DIAGNOSIS:  RIGHT HYDROCELE  POST-OPERATIVE DIAGNOSIS:  RIGHT HYDROCELE  PROCEDURE:  Procedure(s): HYDROCELECTOMY ADULT (Right)  SURGEON:  Surgeon(s) and Role:    * Malen GauzePatrick L Slater Mcmanaman, MD - Primary  PHYSICIAN ASSISTANT:   ASSISTANTS: none   ANESTHESIA:   general  EBL:  Total I/O In: 1400 [I.V.:1400] Out: 135 [Other:125; Blood:10]  BLOOD ADMINISTERED:none  DRAINS: Penrose drain in the right hemiscrotum   LOCAL MEDICATIONS USED:  NONE  SPECIMEN:  Source of Specimen:  right hydrocele sac  DISPOSITION OF SPECIMEN:  PATHOLOGY  COUNTS:  YES  TOURNIQUET:  * No tourniquets in log *  DICTATION: .Note written in EPIC  PLAN OF CARE: Discharge to home after PACU  PATIENT DISPOSITION:  PACU - hemodynamically stable.   Delay start of Pharmacological VTE agent (>24hrs) due to surgical blood loss or risk of bleeding: not applicable

## 2015-12-28 NOTE — H&P (View-Only) (Signed)
Urology Admission H&P  Chief Complaint: Left flank pain  History of Present Illness: [name] is a 69yo with a hx of nephrolithiasis who is s/p Lewis stent placement for a distal Lewis ureteral calculus. He also has a left 2cm upper pole renal calculus. He has severe LUTS with the stent in place  Past Medical History  Diagnosis Date  . Diabetes mellitus without complication (HCC)   . Hypertension   . History of lithotripsy 2008  . Renal disorder   . Kidney stone on left side   . History of kidney stones     Multiple stones- no loss of kidney function  . Sleep apnea     cpap-settings 8   Past Surgical History  Procedure Laterality Date  . Cystoscopy w/ ureteral stent placement Left 09/27/2015    Procedure: CYSTOSCOPY WITH RETROGRADE PYELOGRAM/URETERAL STENT PLACEMENT;  Surgeon: Seth Acheheodore Manny, MD;  Location: [facility];  Service: Urology;  Laterality: Left;  . Shoulder surgery Bilateral     x2 rotator cuff  . Cervical fusion      x2  . Cholecystectomy    . Kidney stone surgery Right     '02  . Reverse shoulder arthroplasty Right   . Trigger finger release Right     x2  . Tonsillectomy    . Hydrocele excision      drainage only    Home Medications:  Prescriptions prior to admission  Medication Sig Dispense Refill Last Dose  . albuterol (PROVENTIL HFA;VENTOLIN HFA) 108 (90 BASE) MCG/ACT inhaler Inhale 2 puffs into the lungs every 4 (four) hours as needed for wheezing. (Patient taking differently: Inhale 2 puffs into the lungs every 4 (four) hours as needed for wheezing (not taking at this time). ) 1 Inhaler 0 unknown  . ANDROGEL PUMP 20.25 MG/ACT (1.62%) GEL Apply 1 application topically daily. To shoulders  2 12/07/2015 at am  . aspirin 81 MG tablet Take 81 mg by mouth at bedtime.    2weeks  . cholecalciferol (VITAMIN D) 1000 UNITS tablet Take 1,000 Units by mouth daily.   12/09/2015 at 0730  . metFORMIN (GLUCOPHAGE-XR) 500 MG 24 hr tablet Take 500 mg by mouth 2 (two) times daily.    12/09/2015 at 2000  . metoprolol (LOPRESSOR) 100 MG tablet Take 50 mg by mouth 2 (two) times daily.   12/10/2015 at 0930  . NIFEdipine (PROCARDIA XL/ADALAT-CC) 60 MG 24 hr tablet Take 60 mg by mouth daily.   12/10/2015 at 0930  . simvastatin (ZOCOR) 40 MG tablet Take 40 mg by mouth every evening.   12/09/2015 at 2000  . tamsulosin (FLOMAX) 0.4 MG CAPS capsule Take 1 capsule (0.4 mg total) by mouth daily. 10 capsule 0 12/10/2015 at 0930  . traZODone (DESYREL) 50 MG tablet Take 50 mg by mouth at bedtime.   12/09/2015 at 2000  . docusate sodium (COLACE) 100 MG capsule Take 1 capsule (100 mg total) by mouth 2 (two) times daily. (Patient not taking: Reported on 11/30/2015) 30 capsule 0   . HYDROcodone-acetaminophen (NORCO/VICODIN) 5-325 MG tablet Take 1-2 tablets by mouth every 6 (six) hours as needed. (Patient not taking: Reported on 11/30/2015) 25 tablet 0   . omeprazole (PRILOSEC) 20 MG capsule Take 1 capsule (20 mg total) by mouth daily. (Patient not taking: Reported on 11/30/2015) 15 capsule 0 Past Week at Unknown time  . ondansetron (ZOFRAN ODT) 4 MG disintegrating tablet Take 1 tablet (4 mg total) by mouth every 8 (eight) hours as needed for nausea  or vomiting. (Patient not taking: Reported on 11/30/2015) 10 tablet 0 More than a month at Unknown time  . oxyCODONE-acetaminophen (PERCOCET/ROXICET) 5-325 MG tablet Take 1-2 tablets by mouth every 6 (six) hours as needed for moderate pain or severe pain. (Patient not taking: Reported on 11/30/2015) 12 tablet 0 Past Week at Unknown time  . polyethylene glycol powder (GLYCOLAX/MIRALAX) powder Take 17 g by mouth 2 (two) times daily. Until daily soft stools  OTC (Patient not taking: Reported on 11/30/2015) 250 g 0 unknown   Allergies: No Known Allergies  History reviewed. No pertinent family history. Social History:  reports that he quit smoking about 33 years ago. His smoking use included Cigarettes. He has a 3.6 pack-year smoking history. He has never used  smokeless tobacco. He reports that he drinks alcohol. He reports that he does not use illicit drugs.  Review of Systems  Gastrointestinal: Positive for nausea.  Genitourinary: Positive for dysuria, urgency, frequency, hematuria and flank pain.  All other systems reviewed and are negative.   Physical Exam:  Vital signs in last 24 hours: Temp:  [97.8 F (36.6 C)] 97.8 F (36.6 C) (03/23 1015) Pulse Rate:  [55] 55 (03/23 1015) Resp:  [18] 18 (03/23 1015) BP: (133)/(77) 133/77 mmHg (03/23 1015) SpO2:  [100 %] 100 % (03/23 1015) Weight:  [97.07 kg (214 lb)] 97.07 kg (214 lb) (03/23 1018) Physical Exam  Constitutional: He is oriented to person, place, and time. He appears well-developed and well-nourished.  HENT:  Head: Normocephalic and atraumatic.  Eyes: EOM are normal. Pupils are equal, round, and reactive to light.  Neck: Normal range of motion. No thyromegaly present.  Cardiovascular: Normal rate and regular rhythm.   Respiratory: Effort normal. No respiratory distress.  GI: Soft. He exhibits distension.  Musculoskeletal: Normal range of motion.  Neurological: He is alert and oriented to person, place, and time.  Skin: Skin is warm and dry.  Psychiatric: He has a normal mood and affect. His behavior is normal. Judgment and thought content normal.    Laboratory Data:  Results for orders placed or performed during the hospital encounter of 12/10/15 (from the past 24 hour(s))  Glucose, capillary     Status: Abnormal   Collection Time: 12/10/15 10:18 AM  Result Value Ref Range   Glucose-Capillary 112 (H) 65 - 99 mg/dL   No results found for this or any previous visit (from the past 240 hour(s)). Creatinine:  Recent Labs  12/08/15 0945  CREATININE 1.18   Baseline Creatinine: 1.2  Impression/Assessment:  69yo with left ureteral calculus and left renal calculus  Plan:  The risks/benefits/alternatives to Left ureteroscopic stone extraction and Lewis PCNL was explained to the  patient and he understands and wishes to proceed with surgery  Seth Lewis 12/10/2015, 10:47 AM

## 2015-12-29 ENCOUNTER — Encounter (HOSPITAL_BASED_OUTPATIENT_CLINIC_OR_DEPARTMENT_OTHER): Payer: Self-pay | Admitting: Urology

## 2015-12-29 DIAGNOSIS — N433 Hydrocele, unspecified: Secondary | ICD-10-CM | POA: Diagnosis not present

## 2016-01-05 NOTE — Op Note (Signed)
Preoperative diagnosis: Right Hydrocele  Postoperative diagnosis: Same  Procedure: 1. Excision of right appendix testis 2. Right hydrocelectomy  Attending: Wilkie AyePatrick Paislynn Hegstrom, MD  Anesthesia: General  History of blood loss: Minimal  Antibiotics: ancef  Drains: none  Specimens: 1. Right hydrocele sac   Findings: 300cc right hydrocele. Right testis was soft and had dense fibrosis surrounding testis  Indications: Patient is a 70 year old male with a history of right hydrocele that was growing in size and causing him pain with walking.  We discussed the treatment options including observation versus excision after discussing treatment options he proceed with excision.   Procedure in detail: Prior to procedure consent was obtained.  Patient was brought to the operating room and a brief timeout was done to ensure correct patient, correct procedure, correct site.  General anesthesia was administered and patient was placed in supine position.  His genitalia was then prepped and draped in usual sterile fashion.  A 3 cm incision was made in the right hemiscrotum.  We dissected down to the tunica and then incised the tunica. A large hydrocele was encountered and was drained. We then excised the hydrocele sac and then over sewed the edge with 2-0 Vicryl in a running fashion. We then excised the right appendix testis. We then placed a drain through a separate incision in the lower right hemiscrotum. This was secured with 3-0 nylon. We then returned the testis to the right hemiscrotum and closed the overlying dartos with 3-0 vicryl in a running fashion. The skin was then closed with 4-0 monocryl in a running fashion. Dermabond was placed on the incision.  A dressing was then applied to the incision.  We then placed a scrotal fluff and this then concluded the procedure which was well tolerated by the patient.  Complications: None  Condition: Stable, extubated, transferred to PACU.  Plan: Patient is to  be discharged home.  He is to follow up in 2 weeks for wound check and drain removal.

## 2016-03-29 ENCOUNTER — Emergency Department (HOSPITAL_COMMUNITY): Payer: BLUE CROSS/BLUE SHIELD

## 2016-03-29 ENCOUNTER — Emergency Department (HOSPITAL_COMMUNITY)
Admission: EM | Admit: 2016-03-29 | Discharge: 2016-03-30 | Disposition: A | Discharge status: Home / Self Care | Payer: BLUE CROSS/BLUE SHIELD | Attending: Emergency Medicine | Admitting: Emergency Medicine

## 2016-03-29 ENCOUNTER — Encounter (HOSPITAL_COMMUNITY): Payer: Self-pay | Admitting: *Deleted

## 2016-03-29 DIAGNOSIS — E119 Type 2 diabetes mellitus without complications: Secondary | ICD-10-CM | POA: Diagnosis not present

## 2016-03-29 DIAGNOSIS — Z7984 Long term (current) use of oral hypoglycemic drugs: Secondary | ICD-10-CM | POA: Diagnosis not present

## 2016-03-29 DIAGNOSIS — R197 Diarrhea, unspecified: Secondary | ICD-10-CM | POA: Diagnosis not present

## 2016-03-29 DIAGNOSIS — I10 Essential (primary) hypertension: Secondary | ICD-10-CM | POA: Insufficient documentation

## 2016-03-29 DIAGNOSIS — Z7982 Long term (current) use of aspirin: Secondary | ICD-10-CM | POA: Diagnosis not present

## 2016-03-29 DIAGNOSIS — R142 Eructation: Secondary | ICD-10-CM | POA: Diagnosis not present

## 2016-03-29 DIAGNOSIS — R109 Unspecified abdominal pain: Secondary | ICD-10-CM

## 2016-03-29 DIAGNOSIS — Z87891 Personal history of nicotine dependence: Secondary | ICD-10-CM | POA: Diagnosis not present

## 2016-03-29 DIAGNOSIS — R52 Pain, unspecified: Secondary | ICD-10-CM

## 2016-03-29 DIAGNOSIS — Z79899 Other long term (current) drug therapy: Secondary | ICD-10-CM | POA: Diagnosis not present

## 2016-03-29 LAB — URINE MICROSCOPIC-ADD ON

## 2016-03-29 LAB — URINALYSIS, ROUTINE W REFLEX MICROSCOPIC
Bilirubin Urine: NEGATIVE
Glucose, UA: NEGATIVE mg/dL
Ketones, ur: NEGATIVE mg/dL
LEUKOCYTES UA: NEGATIVE
NITRITE: NEGATIVE
PH: 6.5 (ref 5.0–8.0)
Protein, ur: NEGATIVE mg/dL
SPECIFIC GRAVITY, URINE: 1.021 (ref 1.005–1.030)

## 2016-03-29 MED ORDER — OXYCODONE-ACETAMINOPHEN 5-325 MG PO TABS
1.0000 | ORAL_TABLET | ORAL | Status: DC | PRN
  Administered 2016-03-29: 1 via ORAL
  Filled 2016-03-29: qty 1
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
## 2016-03-29 NOTE — ED Notes (Signed)
Pt complains of left flank pain and nausea since yesterday. Pt believes he has kidney stones. Pt states he had a CT scan showing kidney stones 2 weeks ago.

## 2016-03-29 NOTE — ED Notes (Signed)
Pt transported to CT ?

## 2016-03-29 NOTE — ED Notes (Signed)
Pain medication given in Triage. Patient advised about side effects of medications and  to avoid driving for a minimum of 4 hours.  

## 2016-03-30 ENCOUNTER — Encounter (HOSPITAL_COMMUNITY): Payer: Self-pay | Admitting: Emergency Medicine

## 2016-03-30 LAB — CBC WITH DIFFERENTIAL/PLATELET
BASOS PCT: 0 %
Basophils Absolute: 0 10*3/uL (ref 0.0–0.1)
Eosinophils Absolute: 0 10*3/uL (ref 0.0–0.7)
Eosinophils Relative: 0 %
HEMATOCRIT: 40.4 % (ref 39.0–52.0)
Hemoglobin: 13 g/dL (ref 13.0–17.0)
Lymphocytes Relative: 23 %
Lymphs Abs: 1.7 10*3/uL (ref 0.7–4.0)
MCH: 29.5 pg (ref 26.0–34.0)
MCHC: 32.2 g/dL (ref 30.0–36.0)
MCV: 91.8 fL (ref 78.0–100.0)
MONO ABS: 0.6 10*3/uL (ref 0.1–1.0)
MONOS PCT: 7 %
NEUTROS ABS: 5.2 10*3/uL (ref 1.7–7.7)
Neutrophils Relative %: 70 %
Platelets: 245 10*3/uL (ref 150–400)
RBC: 4.4 MIL/uL (ref 4.22–5.81)
RDW: 13.8 % (ref 11.5–15.5)
WBC: 7.5 10*3/uL (ref 4.0–10.5)

## 2016-03-30 LAB — I-STAT CHEM 8, ED
BUN: 11 mg/dL (ref 6–20)
CHLORIDE: 99 mmol/L — AB (ref 101–111)
CREATININE: 1.1 mg/dL (ref 0.61–1.24)
Calcium, Ion: 1.07 mmol/L — ABNORMAL LOW (ref 1.12–1.23)
GLUCOSE: 176 mg/dL — AB (ref 65–99)
HCT: 43 % (ref 39.0–52.0)
Hemoglobin: 14.6 g/dL (ref 13.0–17.0)
POTASSIUM: 3.9 mmol/L (ref 3.5–5.1)
Sodium: 137 mmol/L (ref 135–145)
TCO2: 27 mmol/L (ref 0–100)

## 2016-03-30 MED ORDER — ONDANSETRON HCL 4 MG/2ML IJ SOLN
4.0000 mg | Freq: Once | INTRAMUSCULAR | Status: AC
  Administered 2016-03-30: 4 mg via INTRAVENOUS
  Filled 2016-03-30: qty 2

## 2016-03-30 MED ORDER — METHOCARBAMOL 500 MG PO TABS
1000.0000 mg | ORAL_TABLET | Freq: Once | ORAL | Status: AC
  Administered 2016-03-30: 1000 mg via ORAL
  Filled 2016-03-30: qty 2

## 2016-03-30 MED ORDER — TRAMADOL HCL 50 MG PO TABS: 50.0000 mg | ORAL_TABLET | Freq: Four times a day (QID) | ORAL | Status: DC | PRN

## 2016-03-30 MED ORDER — OMEPRAZOLE 20 MG PO CPDR: 20.0000 mg | DELAYED_RELEASE_CAPSULE | Freq: Every day | ORAL | Status: DC

## 2016-03-30 MED ORDER — ONDANSETRON 8 MG PO TBDP: ORAL_TABLET | ORAL | Status: DC

## 2016-03-30 MED ORDER — GI COCKTAIL ~~LOC~~
30.0000 mL | Freq: Once | ORAL | Status: AC
  Administered 2016-03-30: 30 mL via ORAL
  Filled 2016-03-30: qty 30

## 2016-03-30 MED ORDER — MORPHINE SULFATE (PF) 2 MG/ML IV SOLN
2.0000 mg | Freq: Once | INTRAVENOUS | Status: AC
Start: 2016-03-30 — End: 2016-03-30
  Administered 2016-03-30: 2 mg via INTRAVENOUS
  Filled 2016-03-30: qty 1

## 2016-03-30 MED ORDER — TAMSULOSIN HCL 0.4 MG PO CAPS
0.4000 mg | ORAL_CAPSULE | Freq: Every day | ORAL | Status: DC
  Administered 2016-03-30: 0.4 mg via ORAL
  Filled 2016-03-30: qty 1

## 2016-03-30 MED ORDER — SODIUM CHLORIDE 0.9 % IV BOLUS (SEPSIS)
500.0000 mL | Freq: Once | INTRAVENOUS | Status: AC
  Administered 2016-03-30: 500 mL via INTRAVENOUS

## 2016-03-30 MED ORDER — MORPHINE SULFATE (PF) 4 MG/ML IV SOLN
4.0000 mg | Freq: Once | INTRAVENOUS | Status: AC
  Administered 2016-03-30: 4 mg via INTRAVENOUS
  Filled 2016-03-30: qty 1

## 2016-03-30 MED ORDER — KETOROLAC TROMETHAMINE 30 MG/ML IJ SOLN
30.0000 mg | Freq: Once | INTRAMUSCULAR | Status: AC
  Administered 2016-03-30: 30 mg via INTRAVENOUS
  Filled 2016-03-30: qty 1

## 2016-03-30 MED ORDER — DICYCLOMINE HCL 10 MG/ML IM SOLN
20.0000 mg | Freq: Once | INTRAMUSCULAR | Status: AC
  Administered 2016-03-30: 20 mg via INTRAMUSCULAR
  Filled 2016-03-30: qty 2

## 2016-03-30 NOTE — ED Notes (Signed)
RN is starting an IV line and drawing blood work 

## 2016-09-27 DIAGNOSIS — M5416 Radiculopathy, lumbar region: Secondary | ICD-10-CM | POA: Diagnosis not present

## 2016-09-27 DIAGNOSIS — M5136 Other intervertebral disc degeneration, lumbar region: Secondary | ICD-10-CM | POA: Diagnosis not present

## 2016-10-14 DIAGNOSIS — M5416 Radiculopathy, lumbar region: Secondary | ICD-10-CM | POA: Diagnosis not present

## 2016-10-14 DIAGNOSIS — M545 Low back pain: Secondary | ICD-10-CM | POA: Diagnosis not present

## 2016-10-14 DIAGNOSIS — M5136 Other intervertebral disc degeneration, lumbar region: Secondary | ICD-10-CM | POA: Diagnosis not present

## 2016-10-27 DIAGNOSIS — E291 Testicular hypofunction: Secondary | ICD-10-CM | POA: Diagnosis not present

## 2016-10-27 DIAGNOSIS — N200 Calculus of kidney: Secondary | ICD-10-CM | POA: Diagnosis not present

## 2016-11-08 DIAGNOSIS — M5416 Radiculopathy, lumbar region: Secondary | ICD-10-CM | POA: Diagnosis not present

## 2016-11-08 DIAGNOSIS — M545 Low back pain: Secondary | ICD-10-CM | POA: Diagnosis not present

## 2016-11-08 DIAGNOSIS — M5136 Other intervertebral disc degeneration, lumbar region: Secondary | ICD-10-CM | POA: Diagnosis not present

## 2016-11-14 DIAGNOSIS — M4316 Spondylolisthesis, lumbar region: Secondary | ICD-10-CM | POA: Diagnosis not present

## 2016-11-14 DIAGNOSIS — M549 Dorsalgia, unspecified: Secondary | ICD-10-CM | POA: Diagnosis not present

## 2016-11-14 DIAGNOSIS — M5416 Radiculopathy, lumbar region: Secondary | ICD-10-CM | POA: Diagnosis not present

## 2016-11-14 DIAGNOSIS — M5136 Other intervertebral disc degeneration, lumbar region: Secondary | ICD-10-CM | POA: Diagnosis not present

## 2016-11-14 DIAGNOSIS — Z6830 Body mass index (BMI) 30.0-30.9, adult: Secondary | ICD-10-CM | POA: Diagnosis not present

## 2016-11-14 DIAGNOSIS — M4726 Other spondylosis with radiculopathy, lumbar region: Secondary | ICD-10-CM | POA: Diagnosis not present

## 2016-11-14 DIAGNOSIS — M546 Pain in thoracic spine: Secondary | ICD-10-CM | POA: Diagnosis not present

## 2016-11-16 ENCOUNTER — Other Ambulatory Visit: Payer: Self-pay | Admitting: Neurosurgery

## 2016-11-16 DIAGNOSIS — M5416 Radiculopathy, lumbar region: Secondary | ICD-10-CM

## 2016-11-28 ENCOUNTER — Other Ambulatory Visit: Payer: Medicare Other

## 2016-11-28 ENCOUNTER — Inpatient Hospital Stay
Admission: RE | Admit: 2016-11-28 | Discharge: 2016-11-28 | Disposition: A | Discharge status: Home / Self Care | Payer: Medicare Other | Source: Ambulatory Visit | Attending: Neurosurgery | Admitting: Neurosurgery

## 2016-11-28 NOTE — Discharge Instructions (Signed)

## 2016-12-07 ENCOUNTER — Ambulatory Visit
Admission: RE | Admit: 2016-12-07 | Discharge: 2016-12-07 | Disposition: A | Discharge status: Home / Self Care | Payer: BLUE CROSS/BLUE SHIELD | Source: Ambulatory Visit | Attending: Neurosurgery | Admitting: Neurosurgery

## 2016-12-07 DIAGNOSIS — M5416 Radiculopathy, lumbar region: Secondary | ICD-10-CM

## 2016-12-07 DIAGNOSIS — M48061 Spinal stenosis, lumbar region without neurogenic claudication: Secondary | ICD-10-CM | POA: Diagnosis not present

## 2016-12-07 MED ORDER — ONDANSETRON HCL 4 MG/2ML IJ SOLN
4.0000 mg | Freq: Once | INTRAMUSCULAR | Status: AC
  Administered 2016-12-07: 4 mg via INTRAMUSCULAR

## 2016-12-07 MED ORDER — DIAZEPAM 5 MG PO TABS
5.0000 mg | ORAL_TABLET | Freq: Once | ORAL | Status: AC
  Administered 2016-12-07: 5 mg via ORAL

## 2016-12-07 MED ORDER — MEPERIDINE HCL 100 MG/ML IJ SOLN
75.0000 mg | Freq: Once | INTRAMUSCULAR | Status: AC
  Administered 2016-12-07: 75 mg via INTRAMUSCULAR

## 2016-12-07 MED ORDER — IOPAMIDOL (ISOVUE-M 200) INJECTION 41%
18.0000 mL | Freq: Once | INTRAMUSCULAR | Status: AC
  Administered 2016-12-07: 18 mL via INTRATHECAL

## 2016-12-07 NOTE — Discharge Instructions (Signed)

## 2016-12-12 ENCOUNTER — Other Ambulatory Visit: Payer: Self-pay | Admitting: Neurosurgery

## 2016-12-12 DIAGNOSIS — M5416 Radiculopathy, lumbar region: Secondary | ICD-10-CM | POA: Diagnosis not present

## 2016-12-12 DIAGNOSIS — M7138 Other bursal cyst, other site: Secondary | ICD-10-CM | POA: Diagnosis not present

## 2016-12-12 DIAGNOSIS — M5136 Other intervertebral disc degeneration, lumbar region: Secondary | ICD-10-CM | POA: Diagnosis not present

## 2016-12-12 DIAGNOSIS — M4316 Spondylolisthesis, lumbar region: Secondary | ICD-10-CM | POA: Diagnosis not present

## 2016-12-12 DIAGNOSIS — Z6831 Body mass index (BMI) 31.0-31.9, adult: Secondary | ICD-10-CM | POA: Diagnosis not present

## 2016-12-12 DIAGNOSIS — M4726 Other spondylosis with radiculopathy, lumbar region: Secondary | ICD-10-CM | POA: Diagnosis not present

## 2016-12-20 NOTE — Pre-Procedure Instructions (Signed)
BAYRON DALTO  12/20/2016      [id number] [facility] [place],  - [address] [place] [address] Phone: [phone] Fax: [phone]    Your procedure is scheduled on Wed. April 11.  Report to [facility] Admitting at 930 A.M.  Call this number if you have problems the morning of surgery:  [phone]   Remember:  Do not eat food or drink liquids after midnight.  Take these medicines the morning of surgery with A SIP OF WATER gabapentin (neurontin), metoprolol(lopressor).  Stop taking any Vitamins or Herbal Medications. No Goody's, BC's, Aleve, Advil, Motrin, Ibuprofen, naproxen or Fish oil.  Stop Aspirin 81 mg as instructed by your surgeon.   Do not wear jewelry, make-up or nail polish.  Do not wear lotions, powders, or perfumes, or deoderant.  Do not shave 48 hours prior to surgery.  Men may shave face and neck.  Do not bring valuables to the hospital.  [facility] is not responsible for any belongings or valuables.  Contacts, dentures or bridgework may not be worn into surgery.  Leave your suitcase in the car.  After surgery it may be brought to your room.  For patients admitted to the hospital, discharge time will be determined by your treatment team.  Patients discharged the day of surgery will not be allowed to drive home.    Special instructions:      How to Manage Your Diabetes Before and After Surgery  Why is it important to control my blood sugar before and after surgery? . Improving blood sugar levels before and after surgery helps healing and can limit problems. . A way of improving blood sugar control is eating a healthy diet by: o  Eating less sugar and carbohydrates o  Increasing activity/exercise o  Talking with your doctor about reaching your blood sugar goals . High blood sugars (greater than 180 mg/dL) can raise your risk of infections and slow your recovery, so you will  need to focus on controlling your diabetes during the weeks before surgery. . Make sure that the doctor who takes care of your diabetes knows about your planned surgery including the date and location.  How do I manage my blood sugar before surgery? . Check your blood sugar at least 4 times a day, starting 2 days before surgery, to make sure that the level is not too high or low. o Check your blood sugar the morning of your surgery when you wake up and every 2 hours until you get to the Short Stay unit. . If your blood sugar is less than 70 mg/dL, you will need to treat for low blood sugar: o Do not take insulin. o Treat a low blood sugar (less than 70 mg/dL) with  cup of clear juice (cranberry or apple), 4 glucose tablets, OR glucose gel. o Recheck blood sugar in 15 minutes after treatment (to make sure it is greater than 70 mg/dL). If your blood sugar is not greater than 70 mg/dL on recheck, call [phone] for further instructions. . Report your blood sugar to the short stay nurse when you get to Short Stay.  . If you are admitted to the hospital after surgery: o Your blood sugar will be checked by the staff and you will probably be given insulin after surgery (instead of oral diabetes medicines) to make sure you have good blood sugar levels. o The goal for blood sugar control after  surgery is 80-180 mg/dL.     WHAT DO I DO ABOUT MY DIABETES MEDICATION?   Marland Kitchen Do not take oral diabetes medicines (pills) the morning of surgery.   Reviewed and Endorsed by [facility] Patient Education Committee, August 2015 Please read over the following fact sheets that you were given. Pain Booklet, Coughing and Deep Breathing, MRSA Information and Surgical Site Infection Prevention

## 2016-12-21 ENCOUNTER — Encounter (HOSPITAL_COMMUNITY): Payer: Self-pay

## 2016-12-21 ENCOUNTER — Encounter (HOSPITAL_COMMUNITY)
Admission: RE | Admit: 2016-12-21 | Discharge: 2016-12-21 | Disposition: A | Discharge status: Home / Self Care | Payer: BLUE CROSS/BLUE SHIELD | Source: Ambulatory Visit | Attending: Neurosurgery | Admitting: Neurosurgery

## 2016-12-21 DIAGNOSIS — Z01810 Encounter for preprocedural cardiovascular examination: Secondary | ICD-10-CM | POA: Insufficient documentation

## 2016-12-21 DIAGNOSIS — Z01812 Encounter for preprocedural laboratory examination: Secondary | ICD-10-CM | POA: Diagnosis not present

## 2016-12-21 HISTORY — DX: Unspecified osteoarthritis, unspecified site: M19.90

## 2016-12-21 HISTORY — DX: Post-traumatic stress disorder, unspecified: F43.10

## 2016-12-21 LAB — BASIC METABOLIC PANEL
ANION GAP: 11 (ref 5–15)
BUN: 10 mg/dL (ref 6–20)
CALCIUM: 9.1 mg/dL (ref 8.9–10.3)
CO2: 28 mmol/L (ref 22–32)
Chloride: 102 mmol/L (ref 101–111)
Creatinine, Ser: 1.12 mg/dL (ref 0.61–1.24)
GFR calc Af Amer: >60 mL/min (ref >60)
GFR calc non Af Amer: >60 mL/min (ref >60)
GLUCOSE: 210 mg/dL — AB (ref 65–99)
Potassium: 4 mmol/L (ref 3.5–5.1)
Sodium: 141 mmol/L (ref 135–145)

## 2016-12-21 LAB — CBC
HCT: 42.6 % (ref 39.0–52.0)
HEMOGLOBIN: 13.8 g/dL (ref 13.0–17.0)
MCH: 30.6 pg (ref 26.0–34.0)
MCHC: 32.4 g/dL (ref 30.0–36.0)
MCV: 94.5 fL (ref 78.0–100.0)
Platelets: 249 10*3/uL (ref 150–400)
RBC: 4.51 MIL/uL (ref 4.22–5.81)
RDW: 13.3 % (ref 11.5–15.5)
WBC: 6.5 10*3/uL (ref 4.0–10.5)

## 2016-12-21 LAB — SURGICAL PCR SCREEN
MRSA, PCR: NEGATIVE
Staphylococcus aureus: NEGATIVE

## 2016-12-21 LAB — GLUCOSE, CAPILLARY: Glucose-Capillary: 165 mg/dL — ABNORMAL HIGH (ref 65–99)

## 2016-12-21 NOTE — Progress Notes (Signed)
PCP: Dr. Avelina Laine also manages pt's diabetes.  Fasting sugars 118  Last sleep study.5 yrs.

## 2016-12-22 LAB — HEMOGLOBIN A1C
HEMOGLOBIN A1C: 6.6 % — AB (ref 4.8–5.6)
MEAN PLASMA GLUCOSE: 143 mg/dL

## 2016-12-27 MED ORDER — CEFAZOLIN SODIUM-DEXTROSE 2-4 GM/100ML-% IV SOLN
2.0000 g | INTRAVENOUS | Status: AC
  Administered 2016-12-28: 2 g via INTRAVENOUS
  Filled 2016-12-27: qty 100

## 2016-12-28 ENCOUNTER — Inpatient Hospital Stay (HOSPITAL_COMMUNITY): Payer: BLUE CROSS/BLUE SHIELD | Admitting: Anesthesiology

## 2016-12-28 ENCOUNTER — Inpatient Hospital Stay (HOSPITAL_COMMUNITY): Payer: BLUE CROSS/BLUE SHIELD

## 2016-12-28 ENCOUNTER — Inpatient Hospital Stay (HOSPITAL_COMMUNITY)
Admission: RE | Admit: 2016-12-28 | Discharge: 2016-12-29 | DRG: 517 | Disposition: A | Discharge status: Home / Self Care | Payer: BLUE CROSS/BLUE SHIELD | Source: Ambulatory Visit | Attending: Neurosurgery | Admitting: Neurosurgery

## 2016-12-28 ENCOUNTER — Encounter (HOSPITAL_COMMUNITY): Payer: Self-pay | Admitting: *Deleted

## 2016-12-28 ENCOUNTER — Encounter (HOSPITAL_COMMUNITY)
Admission: RE | Disposition: A | Discharge status: Home / Self Care | Payer: Self-pay | Source: Ambulatory Visit | Attending: Neurosurgery

## 2016-12-28 DIAGNOSIS — Z87891 Personal history of nicotine dependence: Secondary | ICD-10-CM | POA: Diagnosis not present

## 2016-12-28 DIAGNOSIS — Z419 Encounter for procedure for purposes other than remedying health state, unspecified: Secondary | ICD-10-CM

## 2016-12-28 DIAGNOSIS — I10 Essential (primary) hypertension: Secondary | ICD-10-CM | POA: Diagnosis present

## 2016-12-28 DIAGNOSIS — G4733 Obstructive sleep apnea (adult) (pediatric): Secondary | ICD-10-CM | POA: Diagnosis present

## 2016-12-28 DIAGNOSIS — Z79899 Other long term (current) drug therapy: Secondary | ICD-10-CM | POA: Diagnosis not present

## 2016-12-28 DIAGNOSIS — Z7984 Long term (current) use of oral hypoglycemic drugs: Secondary | ICD-10-CM

## 2016-12-28 DIAGNOSIS — Z7982 Long term (current) use of aspirin: Secondary | ICD-10-CM | POA: Diagnosis not present

## 2016-12-28 DIAGNOSIS — M7138 Other bursal cyst, other site: Secondary | ICD-10-CM | POA: Diagnosis present

## 2016-12-28 DIAGNOSIS — F4310 Post-traumatic stress disorder, unspecified: Secondary | ICD-10-CM | POA: Diagnosis present

## 2016-12-28 DIAGNOSIS — E119 Type 2 diabetes mellitus without complications: Secondary | ICD-10-CM | POA: Diagnosis present

## 2016-12-28 DIAGNOSIS — M4316 Spondylolisthesis, lumbar region: Secondary | ICD-10-CM | POA: Diagnosis present

## 2016-12-28 DIAGNOSIS — Z87442 Personal history of urinary calculi: Secondary | ICD-10-CM | POA: Diagnosis not present

## 2016-12-28 DIAGNOSIS — Z9049 Acquired absence of other specified parts of digestive tract: Secondary | ICD-10-CM | POA: Diagnosis not present

## 2016-12-28 DIAGNOSIS — M4726 Other spondylosis with radiculopathy, lumbar region: Secondary | ICD-10-CM | POA: Diagnosis present

## 2016-12-28 DIAGNOSIS — M545 Low back pain: Secondary | ICD-10-CM | POA: Diagnosis not present

## 2016-12-28 HISTORY — PX: LUMBAR LAMINECTOMY/DECOMPRESSION MICRODISCECTOMY: SHX5026

## 2016-12-28 LAB — GLUCOSE, CAPILLARY
Glucose-Capillary: 161 mg/dL — ABNORMAL HIGH (ref 65–99)
Glucose-Capillary: 125 mg/dL — ABNORMAL HIGH (ref 65–99)
Glucose-Capillary: 130 mg/dL — ABNORMAL HIGH (ref 65–99)

## 2016-12-28 SURGERY — LUMBAR LAMINECTOMY/DECOMPRESSION MICRODISCECTOMY 1 LEVEL
Anesthesia: General | Laterality: Left

## 2016-12-28 MED ORDER — MIDAZOLAM HCL 2 MG/2ML IJ SOLN
INTRAMUSCULAR | Status: AC
  Filled 2016-12-28: qty 2

## 2016-12-28 MED ORDER — BISACODYL 10 MG RE SUPP: 10.0000 mg | Freq: Every day | RECTAL | Status: DC | PRN

## 2016-12-28 MED ORDER — THROMBIN 5000 UNITS EX SOLR
CUTANEOUS | Status: AC
  Filled 2016-12-28: qty 15000

## 2016-12-28 MED ORDER — KETOROLAC TROMETHAMINE 30 MG/ML IJ SOLN: 30.0000 mg | Freq: Once | INTRAMUSCULAR | Status: DC | PRN

## 2016-12-28 MED ORDER — METHYLPREDNISOLONE ACETATE 80 MG/ML IJ SUSP
INTRAMUSCULAR | Status: AC
  Filled 2016-12-28: qty 1

## 2016-12-28 MED ORDER — SODIUM CHLORIDE 0.9% FLUSH
3.0000 mL | Freq: Two times a day (BID) | INTRAVENOUS | Status: DC
  Administered 2016-12-28: 3 mL via INTRAVENOUS

## 2016-12-28 MED ORDER — HYDROCODONE-ACETAMINOPHEN 5-325 MG PO TABS
1.0000 | ORAL_TABLET | ORAL | Status: DC | PRN
  Administered 2016-12-28 – 2016-12-29 (×3): 2 via ORAL
  Filled 2016-12-28 (×3): qty 2

## 2016-12-28 MED ORDER — SODIUM CHLORIDE 0.9 % IR SOLN
Status: DC | PRN
  Administered 2016-12-28: 12:00:00

## 2016-12-28 MED ORDER — HYDROMORPHONE HCL 1 MG/ML IJ SOLN
0.2500 mg | INTRAMUSCULAR | Status: DC | PRN
  Administered 2016-12-28 (×2): 0.5 mg via INTRAVENOUS

## 2016-12-28 MED ORDER — ROCURONIUM BROMIDE 50 MG/5ML IV SOSY
PREFILLED_SYRINGE | INTRAVENOUS | Status: AC
  Filled 2016-12-28: qty 5

## 2016-12-28 MED ORDER — ONDANSETRON HCL 4 MG/2ML IJ SOLN: 4.0000 mg | Freq: Four times a day (QID) | INTRAMUSCULAR | Status: DC | PRN

## 2016-12-28 MED ORDER — CHLORHEXIDINE GLUCONATE CLOTH 2 % EX PADS: 6.0000 | MEDICATED_PAD | Freq: Once | CUTANEOUS | Status: DC

## 2016-12-28 MED ORDER — ACETAMINOPHEN 650 MG RE SUPP: 650.0000 mg | RECTAL | Status: DC | PRN

## 2016-12-28 MED ORDER — MIDAZOLAM HCL 5 MG/5ML IJ SOLN
INTRAMUSCULAR | Status: DC | PRN
  Administered 2016-12-28: 1 mg via INTRAVENOUS

## 2016-12-28 MED ORDER — FENTANYL CITRATE (PF) 100 MCG/2ML IJ SOLN
INTRAMUSCULAR | Status: DC | PRN
  Administered 2016-12-28 (×2): 50 ug via INTRAVENOUS
  Administered 2016-12-28: 100 ug via INTRAVENOUS
  Administered 2016-12-28: 50 ug via INTRAVENOUS

## 2016-12-28 MED ORDER — METFORMIN HCL ER 500 MG PO TB24: 500.0000 mg | ORAL_TABLET | Freq: Two times a day (BID) | ORAL | Status: DC

## 2016-12-28 MED ORDER — ROCURONIUM BROMIDE 100 MG/10ML IV SOLN
INTRAVENOUS | Status: DC | PRN
  Administered 2016-12-28: 50 mg via INTRAVENOUS

## 2016-12-28 MED ORDER — LIDOCAINE-EPINEPHRINE 1 %-1:100000 IJ SOLN
INTRAMUSCULAR | Status: DC | PRN
  Administered 2016-12-28: 10 mL

## 2016-12-28 MED ORDER — NIFEDIPINE ER 60 MG PO TB24
60.0000 mg | ORAL_TABLET | Freq: Every day | ORAL | Status: DC
  Administered 2016-12-29: 60 mg via ORAL
  Filled 2016-12-28: qty 1

## 2016-12-28 MED ORDER — 0.9 % SODIUM CHLORIDE (POUR BTL) OPTIME
TOPICAL | Status: DC | PRN
  Administered 2016-12-28: 1000 mL

## 2016-12-28 MED ORDER — MAGNESIUM HYDROXIDE 400 MG/5ML PO SUSP: 30.0000 mL | Freq: Every day | ORAL | Status: DC | PRN

## 2016-12-28 MED ORDER — ATORVASTATIN CALCIUM 40 MG PO TABS
40.0000 mg | ORAL_TABLET | Freq: Every day | ORAL | Status: DC
  Administered 2016-12-28: 40 mg via ORAL
  Filled 2016-12-28 (×2): qty 1
  Filled 2016-12-28: qty 2

## 2016-12-28 MED ORDER — ACETAMINOPHEN 10 MG/ML IV SOLN
INTRAVENOUS | Status: AC
  Filled 2016-12-28: qty 100

## 2016-12-28 MED ORDER — SUCCINYLCHOLINE CHLORIDE 20 MG/ML IJ SOLN
INTRAMUSCULAR | Status: DC | PRN
  Administered 2016-12-28: 140 mg via INTRAVENOUS

## 2016-12-28 MED ORDER — ONDANSETRON HCL 4 MG PO TABS: 4.0000 mg | ORAL_TABLET | Freq: Four times a day (QID) | ORAL | Status: DC | PRN

## 2016-12-28 MED ORDER — LIDOCAINE-EPINEPHRINE 1 %-1:100000 IJ SOLN
INTRAMUSCULAR | Status: AC
  Filled 2016-12-28: qty 1

## 2016-12-28 MED ORDER — CYCLOBENZAPRINE HCL 5 MG PO TABS
5.0000 mg | ORAL_TABLET | Freq: Three times a day (TID) | ORAL | Status: DC | PRN
  Administered 2016-12-29: 10 mg via ORAL
  Filled 2016-12-28: qty 2

## 2016-12-28 MED ORDER — KETOROLAC TROMETHAMINE 15 MG/ML IJ SOLN
INTRAMUSCULAR | Status: AC
  Administered 2016-12-28: 15 mg via INTRAVENOUS
  Filled 2016-12-28: qty 1

## 2016-12-28 MED ORDER — VITAMIN D3 25 MCG (1000 UNIT) PO TABS
1000.0000 [IU] | ORAL_TABLET | Freq: Every day | ORAL | Status: DC
  Administered 2016-12-29: 1000 [IU] via ORAL
  Filled 2016-12-28: qty 1

## 2016-12-28 MED ORDER — PHENOL 1.4 % MT LIQD
1.0000 | OROMUCOSAL | Status: DC | PRN
Start: 2016-12-28 — End: 2016-12-29

## 2016-12-28 MED ORDER — PHENYLEPHRINE HCL 10 MG/ML IJ SOLN
INTRAMUSCULAR | Status: DC | PRN
  Administered 2016-12-28 (×2): 80 ug via INTRAVENOUS

## 2016-12-28 MED ORDER — BUPIVACAINE HCL (PF) 0.5 % IJ SOLN
INTRAMUSCULAR | Status: AC
  Filled 2016-12-28: qty 30

## 2016-12-28 MED ORDER — SODIUM CHLORIDE 0.9% FLUSH: 3.0000 mL | INTRAVENOUS | Status: DC | PRN

## 2016-12-28 MED ORDER — ALUM & MAG HYDROXIDE-SIMETH 200-200-20 MG/5ML PO SUSP: 30.0000 mL | Freq: Four times a day (QID) | ORAL | Status: DC | PRN

## 2016-12-28 MED ORDER — ONDANSETRON HCL 4 MG/2ML IJ SOLN
INTRAMUSCULAR | Status: DC | PRN
  Administered 2016-12-28: 4 mg via INTRAVENOUS

## 2016-12-28 MED ORDER — VANCOMYCIN HCL 1000 MG IV SOLR
INTRAVENOUS | Status: AC
  Filled 2016-12-28: qty 1000

## 2016-12-28 MED ORDER — PROPOFOL 10 MG/ML IV BOLUS
INTRAVENOUS | Status: AC
  Filled 2016-12-28: qty 20

## 2016-12-28 MED ORDER — MORPHINE SULFATE (PF) 4 MG/ML IV SOLN: 4.0000 mg | INTRAVENOUS | Status: DC | PRN

## 2016-12-28 MED ORDER — EPHEDRINE SULFATE 50 MG/ML IJ SOLN
INTRAMUSCULAR | Status: DC | PRN
  Administered 2016-12-28: 10 mg via INTRAVENOUS
  Administered 2016-12-28: 15 mg via INTRAVENOUS
  Administered 2016-12-28: 10 mg via INTRAVENOUS

## 2016-12-28 MED ORDER — HYDROMORPHONE HCL 1 MG/ML IJ SOLN
INTRAMUSCULAR | Status: AC
  Filled 2016-12-28: qty 0.5

## 2016-12-28 MED ORDER — LIDOCAINE HCL (CARDIAC) 20 MG/ML IV SOLN
INTRAVENOUS | Status: DC | PRN
  Administered 2016-12-28: 100 mg via INTRAVENOUS

## 2016-12-28 MED ORDER — PROPOFOL 10 MG/ML IV BOLUS
INTRAVENOUS | Status: DC | PRN
  Administered 2016-12-28: 200 mg via INTRAVENOUS

## 2016-12-28 MED ORDER — HYDROXYZINE HCL 50 MG/ML IM SOLN: 50.0000 mg | INTRAMUSCULAR | Status: DC | PRN

## 2016-12-28 MED ORDER — THROMBIN 5000 UNITS EX SOLR
CUTANEOUS | Status: DC | PRN
  Administered 2016-12-28 (×2): 5000 [IU] via TOPICAL

## 2016-12-28 MED ORDER — HYDROXYZINE HCL 25 MG PO TABS: 50.0000 mg | ORAL_TABLET | ORAL | Status: DC | PRN

## 2016-12-28 MED ORDER — ACETAMINOPHEN 325 MG PO TABS: 650.0000 mg | ORAL_TABLET | ORAL | Status: DC | PRN

## 2016-12-28 MED ORDER — KETOROLAC TROMETHAMINE 15 MG/ML IJ SOLN
15.0000 mg | Freq: Four times a day (QID) | INTRAMUSCULAR | Status: DC
  Administered 2016-12-28 – 2016-12-29 (×2): 15 mg via INTRAVENOUS
  Filled 2016-12-28 (×2): qty 1

## 2016-12-28 MED ORDER — EPHEDRINE 5 MG/ML INJ
INTRAVENOUS | Status: AC
  Filled 2016-12-28: qty 10

## 2016-12-28 MED ORDER — MENTHOL 3 MG MT LOZG: 1.0000 | LOZENGE | OROMUCOSAL | Status: DC | PRN

## 2016-12-28 MED ORDER — MEPERIDINE HCL 25 MG/ML IJ SOLN: 6.2500 mg | INTRAMUSCULAR | Status: DC | PRN

## 2016-12-28 MED ORDER — POTASSIUM CHLORIDE IN NACL 20-0.9 MEQ/L-% IV SOLN: INTRAVENOUS | Status: DC

## 2016-12-28 MED ORDER — GABAPENTIN 300 MG PO CAPS
300.0000 mg | ORAL_CAPSULE | Freq: Three times a day (TID) | ORAL | Status: DC
  Administered 2016-12-28 – 2016-12-29 (×2): 300 mg via ORAL
  Filled 2016-12-28 (×2): qty 1

## 2016-12-28 MED ORDER — SUCCINYLCHOLINE CHLORIDE 200 MG/10ML IV SOSY
PREFILLED_SYRINGE | INTRAVENOUS | Status: AC
  Filled 2016-12-28: qty 10

## 2016-12-28 MED ORDER — ONDANSETRON HCL 4 MG/2ML IJ SOLN
INTRAMUSCULAR | Status: AC
  Filled 2016-12-28: qty 2

## 2016-12-28 MED ORDER — LIDOCAINE 2% (20 MG/ML) 5 ML SYRINGE
INTRAMUSCULAR | Status: AC
  Filled 2016-12-28: qty 5

## 2016-12-28 MED ORDER — LACTATED RINGERS IV SOLN
INTRAVENOUS | Status: DC
  Administered 2016-12-28 (×3): via INTRAVENOUS

## 2016-12-28 MED ORDER — SUGAMMADEX SODIUM 200 MG/2ML IV SOLN
INTRAVENOUS | Status: DC | PRN
  Administered 2016-12-28: 400 mg via INTRAVENOUS

## 2016-12-28 MED ORDER — BUPIVACAINE HCL (PF) 0.5 % IJ SOLN
INTRAMUSCULAR | Status: DC | PRN
  Administered 2016-12-28: 10 mL

## 2016-12-28 MED ORDER — FENTANYL CITRATE (PF) 250 MCG/5ML IJ SOLN
INTRAMUSCULAR | Status: AC
  Filled 2016-12-28: qty 5

## 2016-12-28 MED ORDER — KETOROLAC TROMETHAMINE 15 MG/ML IJ SOLN
15.0000 mg | Freq: Once | INTRAMUSCULAR | Status: AC
  Administered 2016-12-28: 15 mg via INTRAVENOUS

## 2016-12-28 MED ORDER — PROMETHAZINE HCL 25 MG/ML IJ SOLN: 6.2500 mg | INTRAMUSCULAR | Status: DC | PRN

## 2016-12-28 MED ORDER — FENTANYL CITRATE (PF) 100 MCG/2ML IJ SOLN
INTRAMUSCULAR | Status: AC
  Filled 2016-12-28: qty 2

## 2016-12-28 MED ORDER — METOPROLOL TARTRATE 50 MG PO TABS
50.0000 mg | ORAL_TABLET | Freq: Two times a day (BID) | ORAL | Status: DC
  Administered 2016-12-29: 50 mg via ORAL
  Filled 2016-12-28 (×2): qty 1
  Filled 2016-12-28: qty 2

## 2016-12-28 MED ORDER — FLEET ENEMA 7-19 GM/118ML RE ENEM: 1.0000 | ENEMA | Freq: Once | RECTAL | Status: DC | PRN

## 2016-12-28 MED ORDER — THROMBIN 5000 UNITS EX SOLR
OROMUCOSAL | Status: DC | PRN
  Administered 2016-12-28: 12:00:00 via TOPICAL

## 2016-12-28 SURGICAL SUPPLY — 63 items
ADH SKN CLS APL DERMABOND .7 (GAUZE/BANDAGES/DRESSINGS) ×1
APL SKNCLS STERI-STRIP NONHPOA (GAUZE/BANDAGES/DRESSINGS)
BAG DECANTER FOR FLEXI CONT (MISCELLANEOUS) ×2 IMPLANT
BENZOIN TINCTURE PRP APPL 2/3 (GAUZE/BANDAGES/DRESSINGS) IMPLANT
BLADE CLIPPER SURG (BLADE) IMPLANT
BUR ACORN 6.0 ACORN (BURR) IMPLANT
BUR ACRON 5.0MM COATED (BURR) IMPLANT
BUR MATCHSTICK NEURO 3.0 LAGG (BURR) ×2 IMPLANT
CANISTER SUCT 3000ML PPV (MISCELLANEOUS) ×2 IMPLANT
CARTRIDGE OIL MAESTRO DRILL (MISCELLANEOUS) ×1 IMPLANT
DERMABOND ADVANCED (GAUZE/BANDAGES/DRESSINGS) ×1
DERMABOND ADVANCED .7 DNX12 (GAUZE/BANDAGES/DRESSINGS) IMPLANT
DIFFUSER DRILL AIR PNEUMATIC (MISCELLANEOUS) ×2 IMPLANT
DRAPE LAPAROTOMY 100X72X124 (DRAPES) ×2 IMPLANT
DRAPE MICROSCOPE LEICA (MISCELLANEOUS) ×2 IMPLANT
DRAPE POUCH INSTRU U-SHP 10X18 (DRAPES) ×2 IMPLANT
ELECT REM PT RETURN 9FT ADLT (ELECTROSURGICAL) ×2
ELECTRODE REM PT RTRN 9FT ADLT (ELECTROSURGICAL) ×1 IMPLANT
GAUZE SPONGE 4X4 12PLY STRL (GAUZE/BANDAGES/DRESSINGS) IMPLANT
GAUZE SPONGE 4X4 16PLY XRAY LF (GAUZE/BANDAGES/DRESSINGS) IMPLANT
GLOVE BIO SURGEON STRL SZ8 (GLOVE) ×1 IMPLANT
GLOVE BIOGEL PI IND STRL 8 (GLOVE) ×1 IMPLANT
GLOVE BIOGEL PI INDICATOR 8 (GLOVE) ×1
GLOVE ECLIPSE 7.5 STRL STRAW (GLOVE) ×6 IMPLANT
GLOVE EXAM NITRILE LRG STRL (GLOVE) IMPLANT
GLOVE EXAM NITRILE XL STR (GLOVE) IMPLANT
GLOVE EXAM NITRILE XS STR PU (GLOVE) IMPLANT
GLOVE INDICATOR 7.5 STRL GRN (GLOVE) ×2 IMPLANT
GLOVE INDICATOR 8.0 STRL GRN (GLOVE) ×1 IMPLANT
GLOVE SS BIOGEL STRL SZ 7 (GLOVE) IMPLANT
GLOVE SUPERSENSE BIOGEL SZ 7 (GLOVE) ×2
GOWN STRL REUS W/ TWL LRG LVL3 (GOWN DISPOSABLE) ×1 IMPLANT
GOWN STRL REUS W/ TWL XL LVL3 (GOWN DISPOSABLE) IMPLANT
GOWN STRL REUS W/TWL 2XL LVL3 (GOWN DISPOSABLE) IMPLANT
GOWN STRL REUS W/TWL LRG LVL3 (GOWN DISPOSABLE) ×2
GOWN STRL REUS W/TWL XL LVL3 (GOWN DISPOSABLE)
HEMOSTAT POWDER SURGIFOAM 1G (HEMOSTASIS) ×1 IMPLANT
KIT BASIN OR (CUSTOM PROCEDURE TRAY) ×2 IMPLANT
KIT ROOM TURNOVER OR (KITS) ×2 IMPLANT
NDL HYPO 18GX1.5 BLUNT FILL (NEEDLE) IMPLANT
NDL SPNL 18GX3.5 QUINCKE PK (NEEDLE) ×1 IMPLANT
NDL SPNL 22GX3.5 QUINCKE BK (NEEDLE) ×1 IMPLANT
NEEDLE HYPO 18GX1.5 BLUNT FILL (NEEDLE) IMPLANT
NEEDLE SPNL 18GX3.5 QUINCKE PK (NEEDLE) ×2 IMPLANT
NEEDLE SPNL 22GX3.5 QUINCKE BK (NEEDLE) ×2 IMPLANT
NS IRRIG 1000ML POUR BTL (IV SOLUTION) ×2 IMPLANT
OIL CARTRIDGE MAESTRO DRILL (MISCELLANEOUS) ×2
PACK LAMINECTOMY NEURO (CUSTOM PROCEDURE TRAY) ×2 IMPLANT
PAD ARMBOARD 7.5X6 YLW CONV (MISCELLANEOUS) ×6 IMPLANT
PATTIES SURGICAL .5 X1 (DISPOSABLE) ×2 IMPLANT
RUBBERBAND STERILE (MISCELLANEOUS) ×4 IMPLANT
SPONGE LAP 4X18 X RAY DECT (DISPOSABLE) IMPLANT
SPONGE SURGIFOAM ABS GEL SZ50 (HEMOSTASIS) ×2 IMPLANT
STRIP CLOSURE SKIN 1/2X4 (GAUZE/BANDAGES/DRESSINGS) IMPLANT
SUT PROLENE 6 0 BV (SUTURE) IMPLANT
SUT VIC AB 1 CT1 18XBRD ANBCTR (SUTURE) ×1 IMPLANT
SUT VIC AB 1 CT1 8-18 (SUTURE) ×10
SUT VIC AB 2-0 CP2 18 (SUTURE) ×2 IMPLANT
SUT VIC AB 3-0 SH 8-18 (SUTURE) IMPLANT
SYR 5ML LL (SYRINGE) IMPLANT
TOWEL GREEN STERILE (TOWEL DISPOSABLE) ×2 IMPLANT
TOWEL GREEN STERILE FF (TOWEL DISPOSABLE) ×2 IMPLANT
WATER STERILE IRR 1000ML POUR (IV SOLUTION) ×2 IMPLANT

## 2016-12-28 NOTE — Anesthesia Preprocedure Evaluation (Signed)
Anesthesia Evaluation  Patient identified by MRN, date of birth, ID band  Reviewed: NPO status , Patient's Chart, lab work & pertinent test results, reviewed documented beta blocker date and time   Airway Mallampati: I       Dental no notable dental hx.    Pulmonary former smoker,    Pulmonary exam normal        Cardiovascular hypertension, Pt. on medications and Pt. on home beta blockers Normal cardiovascular exam Rhythm:Regular Rate:Normal     Neuro/Psych negative neurological ROS     GI/Hepatic negative GI ROS, Neg liver ROS,   Endo/Other  diabetes, Type 2, Oral Hypoglycemic Agents  Renal/GU      Musculoskeletal   Abdominal Normal abdominal exam  (+)   Peds  Hematology negative hematology ROS (+)   Anesthesia Other Findings   Reproductive/Obstetrics                             Anesthesia Physical Anesthesia Plan  ASA: II  Anesthesia Plan: General   Post-op Pain Management:    Induction: Intravenous  Airway Management Planned: Oral ETT  Additional Equipment:   Intra-op Plan:   Post-operative Plan: Extubation in OR  Informed Consent: I have reviewed the patients History and Physical, chart, labs and discussed the procedure including the risks, benefits and alternatives for the proposed anesthesia with the patient or authorized representative who has indicated his/her understanding and acceptance.   Dental advisory given  Plan Discussed with: CRNA and Surgeon  Anesthesia Plan Comments:         Anesthesia Quick Evaluation

## 2016-12-28 NOTE — Op Note (Signed)
12/28/2016  2:09 PM  PATIENT:  Seth Lewis  71 y.o. male  PRE-OPERATIVE DIAGNOSIS:  Left L4-5 synovial cyst, L4-5 spondylolisthesis, lumbar spondylosis, left lumbar radiculopathy   POST-OPERATIVE DIAGNOSIS:  Left L4-5 synovial cyst, L4-5 spondylolisthesis, lumbar spondylosis, left lumbar radiculopathy  PROCEDURE:  Procedure(s):  Left Lumbar Four-Five lumbar laminotomy and resection of synovial cyst with microdissection, microsurgical technique, and the operating microscope  SURGEON:  Surgeon(s): Shirlean Kelly, MD Tia Alert, MD  ASSISTANTS: Marikay Alar, M.D.  ANESTHESIA:   general  EBL:  Total I/O In: 1000 [I.V.:1000] Out: 150 [Blood:150]  BLOOD ADMINISTERED:none  COUNT: Correct per nursing staff  DICTATION: Patient was brought to the operating room and placed under general endotracheal anesthesia. Patient was turned to prone position the lumbar region was prepped with Betadine soap and solution and draped in a sterile fashion. The midline was infiltrated with local anesthetic with epinephrine. A localizing x-ray was taken and the L4-5 level was identified. Midline incision was made over the L4-5 level and was carried down through the subcutaneous tissue to the lumbar fascia. The lumbar fascia was incised on the left side and the paraspinal muscles were dissected from the spinous processes and lamina in a subperiosteal fashion. Another x-ray was taken and the L4-5 intralaminar space was identified. The operating microscope was draped and brought into the field provided additional magnification, illumination, and visualization. Laminotomy was performed using the high-speed drill and Kerrison punches. We dissected around the margins of the ligamentum flavum and progressively mobilized a large L4-5 synovial cyst. It was removed in a piecemeal fashion along with the ligament of flavum, and good decompression of the thecal sac and exiting left L5 nerve root was achieved. Hemostasis  was established with the use of bipolar cautery and Gelfoam with thrombin. The Gelfoam was removed, the wound irrigated, and hemostasis confirmed. We then instilled 2 cc of fentanyl into the epidural space. Deep fascia was closed with interrupted undyed 1 Vicryl sutures. Scarpa's fascia was closed with interrupted undyed 1 Vicryl sutures in the subcutaneous and subcuticular layer were closed with interrupted inverted 2-0 undyed Vicryl sutures. The skin edges were approximated with Dermabond. A dressing of sterile gauze and Hypafix was applied. Following surgery the patient was turned back to a supine position to be reversed from the anesthetic extubated and transferred to the recovery room for further care.  PLAN OF CARE: Admit to inpatient   PATIENT DISPOSITION:  PACU - hemodynamically stable.   Delay start of Pharmacological VTE agent (>24hrs) due to surgical blood loss or risk of bleeding:  yes

## 2016-12-28 NOTE — Anesthesia Procedure Notes (Signed)
Procedure Name: Intubation Date/Time: 12/28/2016 12:11 PM Performed by: Orlinda Blalock, Chyler Creely L Pre-anesthesia Checklist: Patient identified, Emergency Drugs available, Suction available, Patient being monitored and Timeout performed Patient Re-evaluated:Patient Re-evaluated prior to inductionPreoxygenation: Pre-oxygenation with 100% oxygen Intubation Type: IV induction Ventilation: Mask ventilation without difficulty Laryngoscope Size: Glidescope and 3 Grade View: Grade I Tube type: Oral Tube size: 8.0 mm Number of attempts: 1 Airway Equipment and Method: Stylet and Video-laryngoscopy Placement Confirmation: ETT inserted through vocal cords under direct vision,  breath sounds checked- equal and bilateral and CO2 detector Secured at: 22 cm Tube secured with: Tape Dental Injury: Teeth and Oropharynx as per pre-operative assessment  Difficulty Due To: Difficulty was anticipated, Difficult Airway- due to reduced neck mobility, Difficult Airway- due to anterior larynx and Difficult Airway- due to limited oral opening Comments: Easy glide scope intubation s trauma

## 2016-12-28 NOTE — Transfer of Care (Signed)
Immediate Anesthesia Transfer of Care Note  Patient: Seth Lewis  Procedure(s) Performed: Procedure(s): Left Lumbar Four-Five lumbar laminotomy and resection of synovial cyst (Left)  Patient Location: PACU  Anesthesia Type:General  Level of Consciousness: awake, alert , oriented and patient cooperative  Airway & Oxygen Therapy: Patient Spontanous Breathing and Patient connected to nasal cannula oxygen  Post-op Assessment: Report given to RN, Post -op Vital signs reviewed and stable and Patient moving all extremities X 4  Post vital signs: Reviewed and stable  Last Vitals:  Vitals:   12/28/16 0923  BP: 139/78  Pulse: (!) 55  Resp: 18  Temp: 36.5 C    Last Pain:  Vitals:   12/28/16 0933  TempSrc:   PainSc: 9          Complications: No apparent anesthesia complications
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
## 2016-12-28 NOTE — OR Nursing (Signed)
Fentanyl 100 mcg/59ml given at surgical site by Dr Newell Coral

## 2016-12-28 NOTE — Progress Notes (Signed)
Vitals:   12/28/16 1730 12/28/16 1745 12/28/16 1757 12/28/16 1800  BP:   (!) 145/84   Pulse: (!) 50 (!) 52 (!) 51 (!) 55  Resp: Temp:    97.7 F (36.5 C)  TempSrc:      SpO2: 100% 100% 100% 100%  Weight:      Height:        Patient resting comfortably in bed, just arrived from PACU. Has already voided twice. Dressing clean and dry. Moving all 4 extremities well.  Plan: Encouraged to ambulate in the halls with the staff. Continue to progress through postoperative recovery.  Hewitt Shorts, MD 12/28/2016, 6:31 PM

## 2016-12-28 NOTE — Anesthesia Postprocedure Evaluation (Addendum)
Anesthesia Post Note  Patient: Seth Lewis  Procedure(s) Performed: Procedure(s) (LRB): Left Lumbar Four-Five lumbar laminotomy and resection of synovial cyst (Left)  Patient location during evaluation: PACU Anesthesia Type: General Level of consciousness: awake Pain management: pain level controlled Vital Signs Assessment: post-procedure vital signs reviewed and stable Respiratory status: spontaneous breathing Cardiovascular status: stable Postop Assessment: no signs of nausea or vomiting Anesthetic complications: no        Last Vitals:  Vitals:   12/28/16 0923 12/28/16 1430  BP: 139/78 135/76  Pulse: (!) 55 64  Resp: 18 20  Temp: 36.5 C 36.5 C    Last Pain:  Vitals:   12/28/16 1516  TempSrc:   PainSc: 8    Pain Goal:                 Aya Geisel JR,JOHN Damaya Channing

## 2016-12-29 ENCOUNTER — Encounter (HOSPITAL_COMMUNITY): Payer: Self-pay | Admitting: Neurosurgery

## 2016-12-29 MED ORDER — HYDROCODONE-ACETAMINOPHEN 5-325 MG PO TABS: 1.0000 | ORAL_TABLET | ORAL | 0 refills | Status: DC | PRN

## 2016-12-29 NOTE — Discharge Summary (Signed)
Physician Discharge Summary  Patient ID: Seth Lewis MRN: [id number] DOB/AGE: 03-26-1946 71 y.o.  Admit date: 12/28/2016 Discharge date: 12/29/2016  Admission Diagnoses:  Left L4-5 synovial cyst, L4-5 spondylolisthesis, lumbar spondylosis, left lumbar radiculopathy   Discharge Diagnoses:  Left L4-5 synovial cyst, L4-5 spondylolisthesis, lumbar spondylosis, left lumbar radiculopathy  Active Problems:   Synovial cyst of lumbar spine   Discharged Condition: good  Hospital Course: Patient was admitted, underwent a left L4-5 lumbar laminotomy and resection of a synovial cyst. Postoperatively he is done well. He is up and ambulating actively. He is voiding well. His dressing was removed, and his incision is healing nicely. He is being discharged home with instructions regarding wound care and activities. He is scheduled to follow-up with me in about 3 weeks.  Discharge Exam: Blood pressure 124/70, pulse 63, temperature 98.4 F (36.9 C), resp. rate 16, height  (1.854 m), weight 103.1 kg (227 lb 3.2 oz), SpO2 99 %.  Disposition: 01-Home or Self Care  Discharge Instructions    Discharge wound care:    Complete by:  As directed    Leave the wound open to air. Shower daily with the wound uncovered. Water and soapy water should run over the incision area. Do not wash directly on the incision for 2 weeks. Remove the glue after 2 weeks.   Driving Restrictions    Complete by:  As directed    No driving for 2 weeks. May ride in the car locally now. May begin to drive locally in 2 weeks.   Other Restrictions    Complete by:  As directed    Walk gradually increasing distances out in the fresh air at least twice a day. Walking additional 6 times inside the house, gradually increasing distances, daily. No bending, lifting, or twisting. Perform activities between shoulder and waist height (that is at counter height when standing or table height when sitting).     Allergies as of 12/29/2016      Reactions   No Known Allergies       Medication List    TAKE these medications   ANDROGEL PUMP 20.25 MG/ACT (1.62%) Gel Generic drug:  Testosterone Apply 3 Squirts topically daily. To shoulders   aspirin 81 MG tablet Take 81 mg by mouth at bedtime.   cholecalciferol 1000 units tablet Commonly known as:  VITAMIN D Take 1,000 Units by mouth daily.   gabapentin 300 MG capsule Commonly known as:  NEURONTIN Take 300 mg by mouth 3 (three) times daily.   HYDROcodone-acetaminophen 5-325 MG tablet Commonly known as:  NORCO/VICODIN Take 1-2 tablets by mouth every 4 (four) hours as needed (pain).   metFORMIN 500 MG 24 hr tablet Commonly known as:  GLUCOPHAGE-XR Take 500 mg by mouth 2 (two) times daily.   metoprolol 100 MG tablet Commonly known as:  LOPRESSOR Take 50 mg by mouth 2 (two) times daily.   NIFEdipine 60 MG 24 hr tablet Commonly known as:  PROCARDIA XL/ADALAT-CC Take 60 mg by mouth daily.   simvastatin 80 MG tablet Commonly known as:  ZOCOR Take 40 mg by mouth daily.   SOMINEX MAXIMUM STRENGTH 50 MG tablet Generic drug:  DiphenhydrAMINE HCl (Sleep) Take 50 mg by mouth at bedtime.        SignedHewitt Shorts 12/29/2016, 10:29 AM

## 2016-12-29 NOTE — Progress Notes (Signed)
Patient alert and oriented, mae's well, voiding adequate amount of urine, swallowing without difficulty, no c/o pain at time of discharge. Patient discharged home with family. Script and discharged instructions given to patient. Patient and family stated understanding of instructions given. Patient has an appointment with Dr. Nudelman 

## 2016-12-29 NOTE — Discharge Instructions (Signed)

## 2017-02-24 NOTE — Addendum Note (Signed)
Addendum  created 02/24/17 1152 by Azure Budnick, MD   Sign clinical note    

## 2017-08-25 DIAGNOSIS — H25813 Combined forms of age-related cataract, bilateral: Secondary | ICD-10-CM | POA: Diagnosis not present

## 2017-08-25 DIAGNOSIS — E119 Type 2 diabetes mellitus without complications: Secondary | ICD-10-CM | POA: Diagnosis not present

## 2018-05-18 ENCOUNTER — Other Ambulatory Visit: Payer: Self-pay | Admitting: Family Medicine

## 2018-05-18 ENCOUNTER — Ambulatory Visit
Admission: RE | Admit: 2018-05-18 | Discharge: 2018-05-18 | Disposition: A | Discharge status: Home / Self Care | Payer: Medicare Other | Source: Ambulatory Visit | Attending: Family Medicine | Admitting: Family Medicine

## 2018-05-18 DIAGNOSIS — Z77090 Contact with and (suspected) exposure to asbestos: Secondary | ICD-10-CM

## 2018-05-27 IMAGING — XA DG MYELOGRAPHY LUMBAR INJ LUMBOSACRAL
6 of 11 series · 6 of 11 positions shown · non-contrast
Comparison: Radiography 11/14/2016.  MRI 09/01/2016.

CLINICAL DATA: Low back and left leg pain radiating to the lateral
calf.
TECHNIQUE: Contiguous axial images were obtained through the Lumbar spine after
the intrathecal infusion of infusion. Coronal and sagittal
reconstructions were obtained of the axial image sets.

[Series 1: w lumbar spine lat · 0.15mm/px · 1 of 1 slices shown]
[im 1/1]
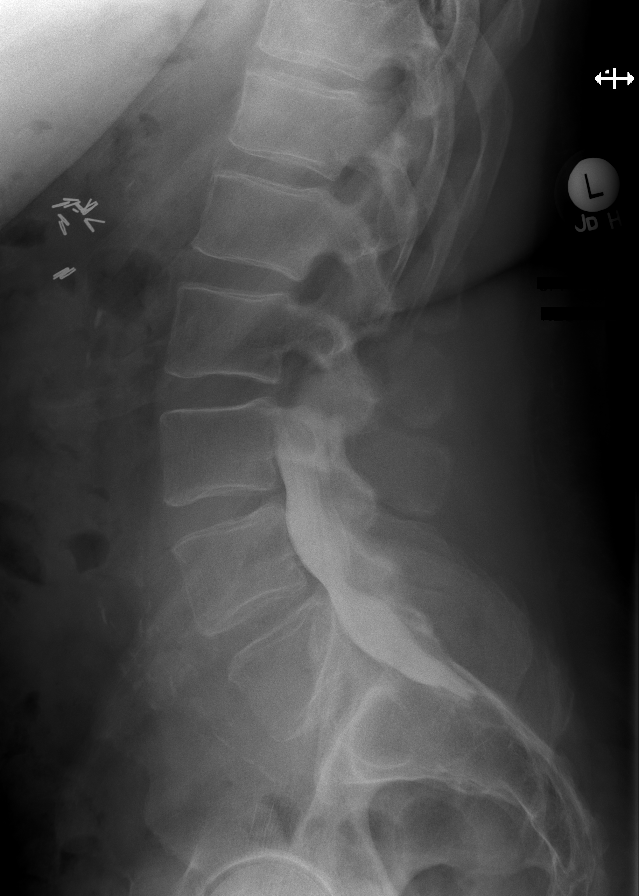

[Series 1: vasc standard · 1 of 1 slices shown (1 of 3)]
[im 1/1]
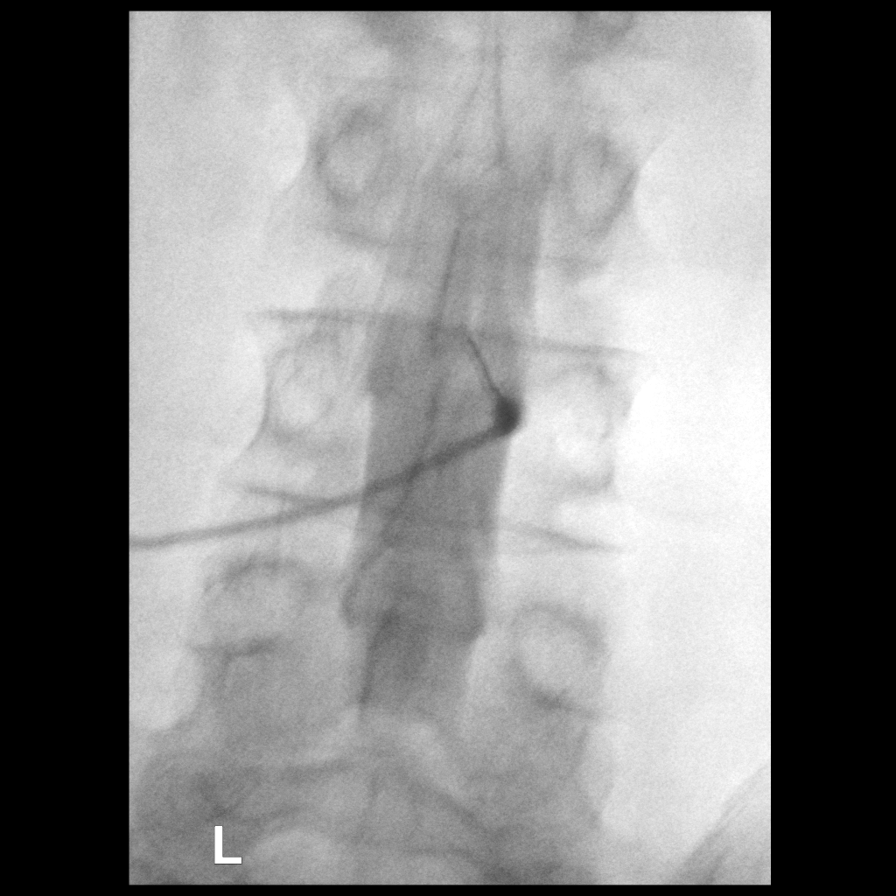

[Series 2: vasc standard · 1 of 1 slices shown (2 of 3)]
[im 1/1]
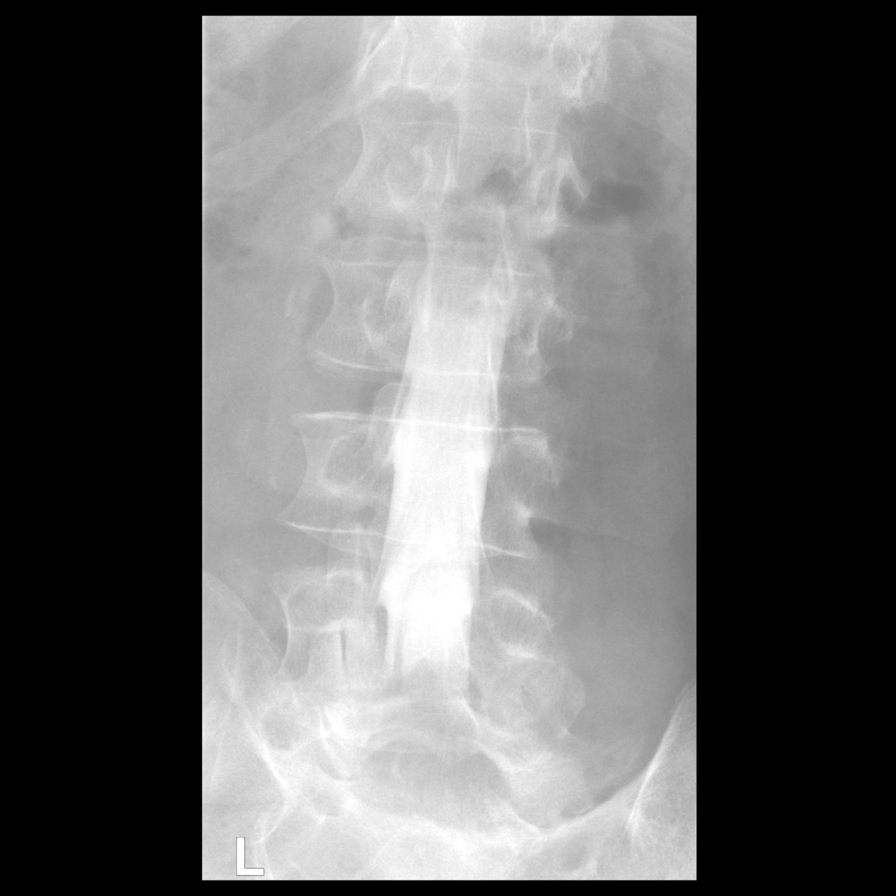

[Series 2: w lumbar spine flexion · 0.15mm/px · 1 of 1 slices shown]
[im 1/1]
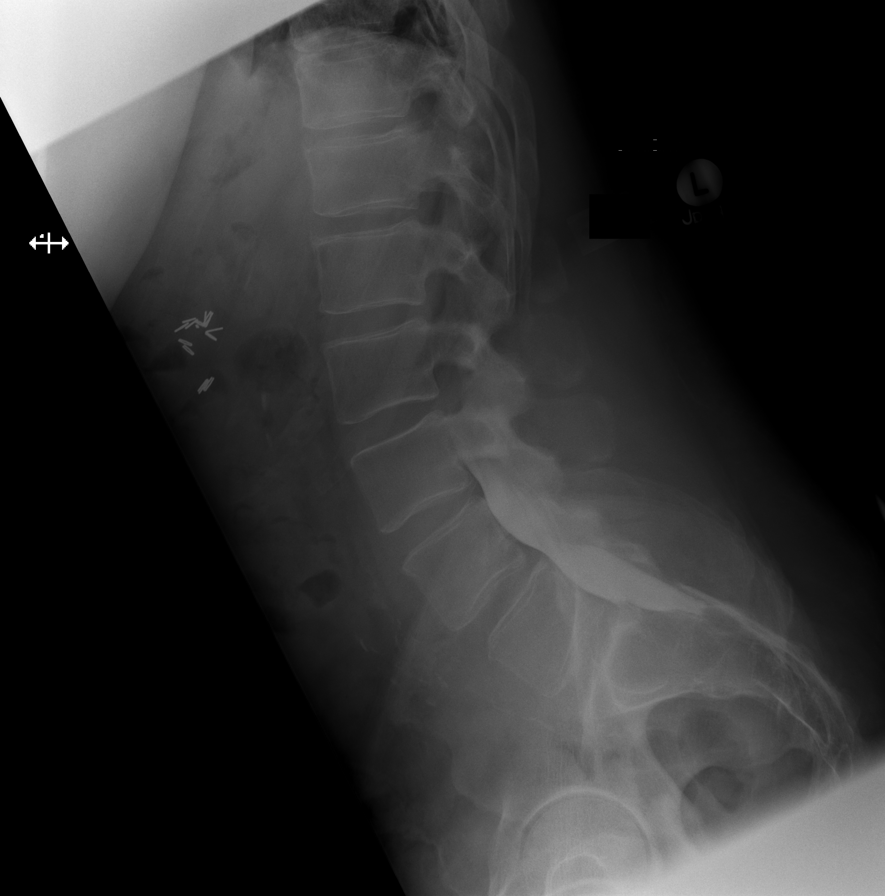

[Series 3: w lumbar spine extension · 0.15mm/px · 1 of 1 slices shown]
[im 1/1]
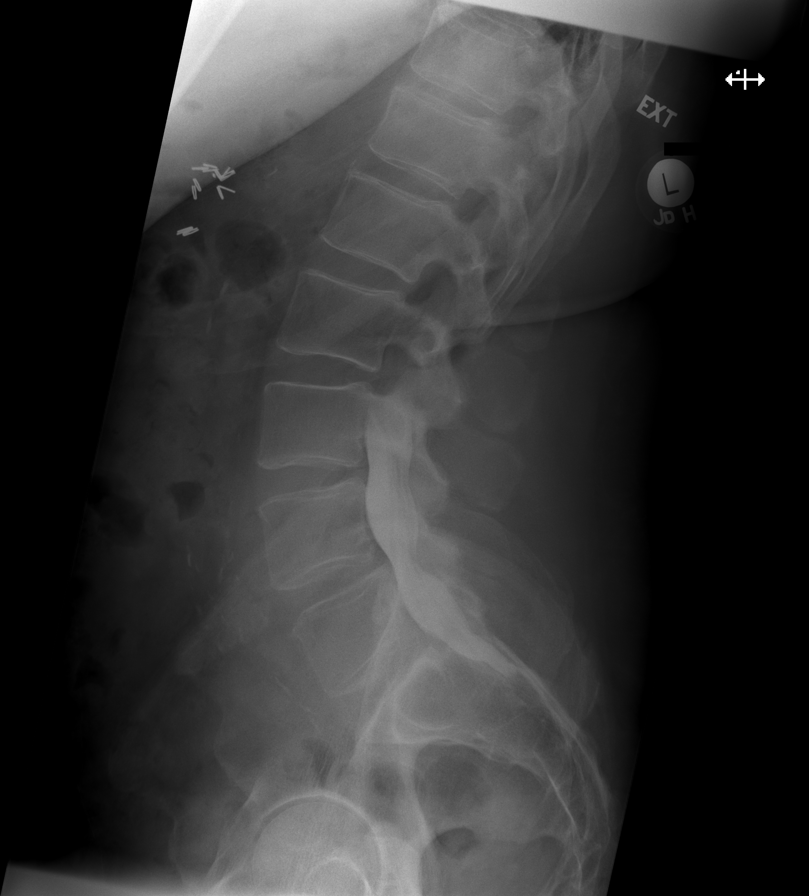

[Series 3: vasc standard · 1 of 1 slices shown (3 of 3)]
[im 1/1]
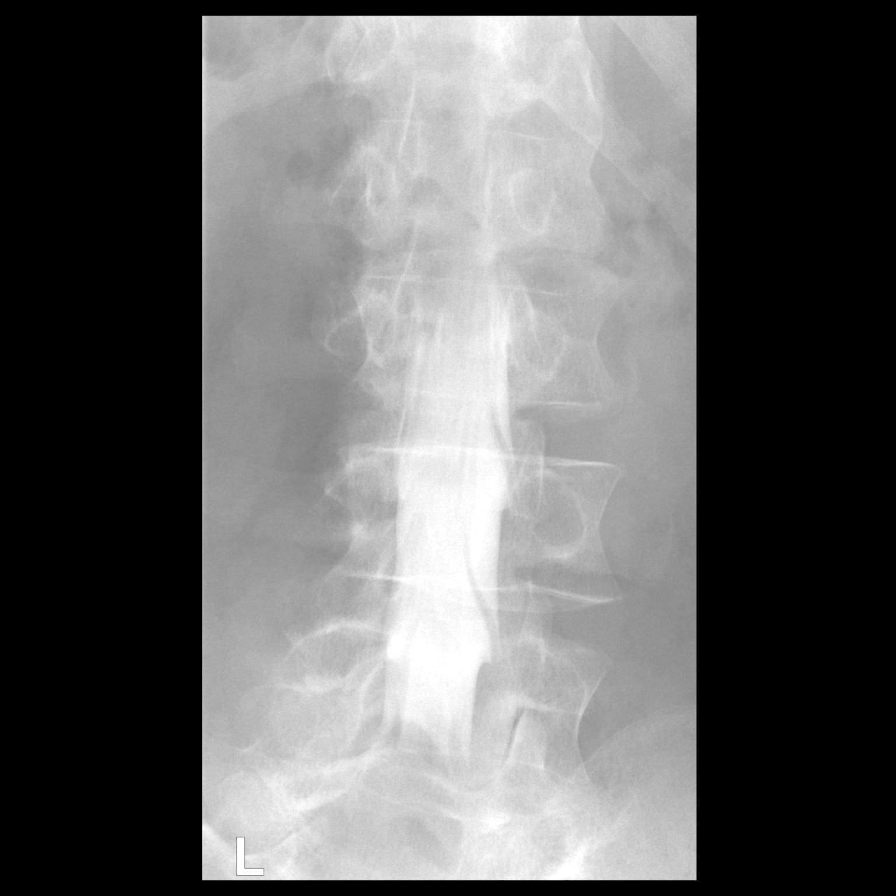

[6 of 11 positions shown; findings below may reference images not displayed]

EXAM:
LUMBAR MYELOGRAM

FLUOROSCOPY TIME:  0 minutes 51 seconds. 307.26 micro gray meter
squared

PROCEDURE:
After thorough discussion of risks and benefits of the procedure
including bleeding, infection, injury to nerves, blood vessels,
adjacent structures as well as headache and CSF leak, written and
oral informed consent was obtained. Consent was obtained by Dr. Herren
Ashwini. Time out form was completed.

Patient was positioned prone on the fluoroscopy table. Local
anesthesia was provided with 1% lidocaine without epinephrine after
prepped and draped in the usual sterile fashion. Puncture was
performed at L3-4 using a 3 1/2 inch 22-gauge spinal needle via
right para median approach. Using a single pass through the dura,
the needle was placed within the thecal sac, with return of clear
CSF. 15 mL of Isovue 200 was injected into the thecal sac, with
normal opacification of the nerve roots and cauda equina consistent
with free flow within the subarachnoid space.

I personally performed the lumbar puncture and administered the
intrathecal contrast. I also personally performed acquisition of the
myelogram images.
FINDINGS: LUMBAR MYELOGRAM FINDINGS:

No significant finding at L3-4 or above.

At L4-5, there is a left lateral recess stenosis with compression
and swelling of the left L5 nerve root. No stenosis or neural
compression seen at L5-S1. Standing flexion extension view show
development of 2-3 mm of anterolisthesis at the L4-5 level.

CT LUMBAR MYELOGRAM FINDINGS:

No abnormality at L3-4 or above.

L4-5: Bilateral facet arthropathy with vacuum phenomenon.
Ligamentous hypertrophy. 5-6 mm synovial cyst projecting inward from
the facet on the left results an lateral recess stenosis and
compression of the left L5 nerve. Small amount of nitrogen gas in
the synovial cyst.

L5-S1 shows mild bulging of the disc but no stenosis.

Sacroiliac joints show ankylosis on the right with solid bridging
osteophytes. There are prominent osteophytes at the fibrous portion
of the left sacroiliac joint without definite solid ankylosis.
IMPRESSION: L4-5 facet arthropathy with vacuum phenomenon. Ligamentous
hypertrophy. 5-6 mm synovial cyst projecting inward from the facet
on the left with resultant stenosis of the lateral recess and left
L5 nerve compression.

Ankylosis of the right sacroiliac joint. Prominent osteophytes at
the fibrous portion of the left sacroiliac joint without evidence of
actual ankylosis/fusion.

## 2018-11-22 ENCOUNTER — Other Ambulatory Visit: Payer: Self-pay | Admitting: Family Medicine

## 2018-11-22 ENCOUNTER — Ambulatory Visit
Admission: RE | Admit: 2018-11-22 | Discharge: 2018-11-22 | Disposition: A | Discharge status: Home / Self Care | Payer: Medicare Other | Source: Ambulatory Visit | Attending: Family Medicine | Admitting: Family Medicine

## 2018-11-22 DIAGNOSIS — R5383 Other fatigue: Secondary | ICD-10-CM

## 2018-11-23 ENCOUNTER — Other Ambulatory Visit: Payer: Self-pay | Admitting: Family Medicine

## 2018-11-23 DIAGNOSIS — R7989 Other specified abnormal findings of blood chemistry: Secondary | ICD-10-CM

## 2018-11-23 DIAGNOSIS — R61 Generalized hyperhidrosis: Secondary | ICD-10-CM

## 2018-11-23 DIAGNOSIS — R945 Abnormal results of liver function studies: Secondary | ICD-10-CM

## 2018-11-28 ENCOUNTER — Ambulatory Visit
Admission: RE | Admit: 2018-11-28 | Discharge: 2018-11-28 | Disposition: A | Discharge status: Home / Self Care | Payer: Medicare Other | Source: Ambulatory Visit | Attending: Family Medicine | Admitting: Family Medicine

## 2018-11-28 DIAGNOSIS — R61 Generalized hyperhidrosis: Secondary | ICD-10-CM

## 2018-11-28 DIAGNOSIS — R7989 Other specified abnormal findings of blood chemistry: Secondary | ICD-10-CM

## 2018-11-28 DIAGNOSIS — R945 Abnormal results of liver function studies: Secondary | ICD-10-CM

## 2019-06-17 ENCOUNTER — Other Ambulatory Visit: Payer: Self-pay | Admitting: Orthopedic Surgery

## 2019-06-17 DIAGNOSIS — M25511 Pain in right shoulder: Secondary | ICD-10-CM

## 2019-06-24 ENCOUNTER — Ambulatory Visit
Admission: RE | Admit: 2019-06-24 | Discharge: 2019-06-24 | Disposition: A | Discharge status: Home / Self Care | Payer: Medicare Other | Source: Ambulatory Visit | Attending: Orthopedic Surgery | Admitting: Orthopedic Surgery

## 2019-06-24 ENCOUNTER — Other Ambulatory Visit: Payer: Self-pay

## 2019-06-24 DIAGNOSIS — M25511 Pain in right shoulder: Secondary | ICD-10-CM

## 2019-07-10 DIAGNOSIS — E119 Type 2 diabetes mellitus without complications: Secondary | ICD-10-CM

## 2019-07-10 DIAGNOSIS — Z87442 Personal history of urinary calculi: Secondary | ICD-10-CM

## 2019-07-10 DIAGNOSIS — Z9989 Dependence on other enabling machines and devices: Secondary | ICD-10-CM

## 2019-07-10 DIAGNOSIS — F4310 Post-traumatic stress disorder, unspecified: Secondary | ICD-10-CM

## 2019-07-10 DIAGNOSIS — M19212 Secondary osteoarthritis, left shoulder: Secondary | ICD-10-CM | POA: Diagnosis present

## 2019-07-10 DIAGNOSIS — G4733 Obstructive sleep apnea (adult) (pediatric): Secondary | ICD-10-CM
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
## 2019-07-18 NOTE — Patient Instructions (Addendum)
DUE TO COVID-19 ONLY ONE VISITOR IS ALLOWED TO COME WITH YOU AND STAY IN THE WAITING ROOM ONLY DURING PRE OP AND PROCEDURE DAY OF SURGERY. THE 1 VISITOR MAY VISIT WITH YOU AFTER SURGERY IN YOUR PRIVATE ROOM DURING VISITING HOURS ONLY!  YOU NEED TO HAVE A COVID 19 TEST ON__Friday 11/06/2020_____ @__1205PM_____ , THIS TEST MUST BE DONE BEFORE SURGERY, COME  [place] [place] , [id number].  ([place]) ONCE YOUR COVID TEST IS COMPLETED, PLEASE BEGIN THE QUARANTINE INSTRUCTIONS AS OUTLINED IN YOUR HANDOUT.                Seth Lewis     Your procedure is scheduled on: Tuesday 07/30/2019   Report to [facility] Main  Entrance    Report to admitting at  1000  AM   Bring CPAP mask and tubing day of surgery   How to Manage Your Diabetes Before and After Surgery  Why is it important to control my blood sugar before and after surgery? . Improving blood sugar levels before and after surgery helps healing and can limit problems. . A way of improving blood sugar control is eating a healthy diet by: o  Eating less sugar and carbohydrates o  Increasing activity/exercise o  Talking with your doctor about reaching your blood sugar goals . High blood sugars (greater than 180 mg/dL) can raise your risk of infections and slow your recovery, so you will need to focus on controlling your diabetes during the weeks before surgery. . Make sure that the doctor who takes care of your diabetes knows about your planned surgery including the date and location.  How do I manage my blood sugar before surgery? . Check your blood sugar at least 4 times a day, starting 2 days before surgery, to make sure that the level is not too high or low. o Check your blood sugar the morning of your surgery when you wake up and every 2 hours until you get to the Short Stay unit. . If your blood sugar is less than 70 mg/dL, you will need to treat for low blood sugar: o Do not take  insulin. o Treat a low blood sugar (less than 70 mg/dL) with  cup of clear juice (cranberry or apple), 4 glucose tablets, OR glucose gel. o Recheck blood sugar in 15 minutes after treatment (to make sure it is greater than 70 mg/dL). If your blood sugar is not greater than 70 mg/dL on recheck, call [phone] for further instructions. . Report your blood sugar to the short stay nurse when you get to Short Stay.  . If you are admitted to the hospital after surgery: o Your blood sugar will be checked by the staff and you will probably be given insulin after surgery (instead of oral diabetes medicines) to make sure you have good blood sugar levels. o The goal for blood sugar control after surgery is 80-180 mg/dL.   WHAT DO I DO ABOUT MY DIABETES MEDICATION?        The day before surgery, Take Metformin (Glucophage-XR) as prescribed!  . Do not take oral diabetes medicines (pills) the morning of surgery.     Call this number if you have problems the morning of surgery [phone]    Remember: Do not eat food  :After Midnight.   NO SOLID FOOD AFTER MIDNIGHT THE NIGHT PRIOR TO SURGERY. NOTHING BY MOUTH EXCEPT CLEAR LIQUIDS UNTIL 0930 am .     PLEASE FINISH  Gatorade  DRINK PER SURGEON ORDER  WHICH NEEDS TO BE COMPLETED AT  0930 am .   CLEAR LIQUID DIET   Foods Allowed                                                                     Foods Excluded  Coffee and tea, regular and decaf                             liquids that you cannot  Plain Jell-O any favor except red or purple                                           see through such as: Fruit ices (not with fruit pulp)                                     milk, soups, orange juice  Iced Popsicles                                    All solid food Carbonated beverages, regular and diet                                    Cranberry, grape and apple juices Sports drinks like Gatorade Lightly seasoned clear broth or consume(fat  free) Sugar, honey syrup  Sample Menu Breakfast                                Lunch                                     Supper Cranberry juice                    Beef broth                            Chicken broth Jell-O                                     Grape juice                           Apple juice Coffee or tea                        Jell-O                                      Popsicle  Coffee or tea                        Coffee or tea  _____________________________________________________________________     BRUSH YOUR TEETH MORNING OF SURGERY AND RINSE YOUR MOUTH OUT, NO CHEWING GUM CANDY OR MINTS.     Take these medicines the morning of surgery with A SIP OF WATER: Simvastatin (Zocor), Metoprolol (Lopressor), Dutasteride   DO NOT TAKE ANY DIABETIC MEDICATIONS DAY OF YOUR SURGERY!                               You may not have any metal on your body including hair pins and              piercings  Do not wear jewelry, make-up, lotions, powders or perfumes, deodorant                         Men may shave face and neck.   Do not bring valuables to the hospital. [facility] IS NOT             RESPONSIBLE   FOR VALUABLES.  Contacts, dentures or bridgework may not be worn into surgery.  Leave suitcase in the car. After surgery it may be brought to your room.                  Please read over the following fact sheets you were given: _____________________________________________________________________             [facility] - Preparing for Surgery Before surgery, you can play an important role.  Because skin is not sterile, your skin needs to be as free of germs as possible.  You can reduce the number of germs on your skin by washing with CHG (chlorahexidine gluconate) soap before surgery.  CHG is an antiseptic cleaner which kills germs and bonds with the skin to continue killing germs even after washing. Please DO NOT use  if you have an allergy to CHG or antibacterial soaps.  If your skin becomes reddened/irritated stop using the CHG and inform your nurse when you arrive at Short Stay. Do not shave (including legs and underarms) for at least 48 hours prior to the first CHG shower.  You may shave your face/neck. Please follow these instructions carefully:  1.  Shower with CHG Soap the night before surgery and the  morning of Surgery.  2.  If you choose to wash your hair, wash your hair first as usual with your  normal  shampoo.  3.  After you shampoo, rinse your hair and body thoroughly to remove the  shampoo.                           4.  Use CHG as you would any other liquid soap.  You can apply chg directly  to the skin and wash                       Gently with a scrungie or clean washcloth.  5.  Apply the CHG Soap to your body ONLY FROM THE NECK DOWN.   Do not use on face/ open                           Wound or  open sores. Avoid contact with eyes, ears mouth and genitals (private parts).                       Wash face,  Genitals (private parts) with your normal soap.             6.  Wash thoroughly, paying special attention to the area where your surgery  will be performed.  7.  Thoroughly rinse your body with warm water from the neck down.  8.  DO NOT shower/wash with your normal soap after using and rinsing off  the CHG Soap.                9.  Pat yourself dry with a clean towel.            10.  Wear clean pajamas.            11.  Place clean sheets on your bed the night of your first shower and do not  sleep with pets. Day of Surgery : Do not apply any lotions/deodorants the morning of surgery.  Please wear clean clothes to the hospital/surgery center.  FAILURE TO FOLLOW THESE INSTRUCTIONS MAY RESULT IN THE CANCELLATION OF YOUR SURGERY PATIENT SIGNATURE_________________________________  NURSE  SIGNATURE__________________________________  ________________________________________________________________________   Rogelia Mire  An incentive spirometer is a tool that can help keep your lungs clear and active. This tool measures how well you are filling your lungs with each breath. Taking long deep breaths may help reverse or decrease the chance of developing breathing (pulmonary) problems (especially infection) following:  A long period of time when you are unable to move or be active. BEFORE THE PROCEDURE   If the spirometer includes an indicator to show your best effort, your nurse or respiratory therapist will set it to a desired goal.  If possible, sit up straight or lean slightly forward. Try not to slouch.  Hold the incentive spirometer in an upright position. INSTRUCTIONS FOR USE  1. Sit on the edge of your bed if possible, or sit up as far as you can in bed or on a chair. 2. Hold the incentive spirometer in an upright position. 3. Breathe out normally. 4. Place the mouthpiece in your mouth and seal your lips tightly around it. 5. Breathe in slowly and as deeply as possible, raising the piston or the ball toward the top of the column. 6. Hold your breath for 3-5 seconds or for as long as possible. Allow the piston or ball to fall to the bottom of the column. 7. Remove the mouthpiece from your mouth and breathe out normally. 8. Rest for a few seconds and repeat Steps 1 through 7 at least 10 times every 1-2 hours when you are awake. Take your time and take a few normal breaths between deep breaths. 9. The spirometer may include an indicator to show your best effort. Use the indicator as a goal to work toward during each repetition. 10. After each set of 10 deep breaths, practice coughing to be sure your lungs are clear. If you have an incision (the cut made at the time of surgery), support your incision when coughing by placing a pillow or rolled up towels firmly  against it. Once you are able to get out of bed, walk around indoors and cough well. You may stop using the incentive spirometer when instructed by your caregiver.  RISKS AND COMPLICATIONS  Take your time so you do not get dizzy  or light-headed.  If you are in pain, you may need to take or ask for pain medication before doing incentive spirometry. It is harder to take a deep breath if you are having pain. AFTER USE  Rest and breathe slowly and easily.  It can be helpful to keep track of a log of your progress. Your caregiver can provide you with a simple table to help with this. If you are using the spirometer at home, follow these instructions: [place] IF:   You are having difficultly using the spirometer.  You have trouble using the spirometer as often as instructed.  Your pain medication is not giving enough relief while using the spirometer.  You develop fever of 100.5 F (38.1 C) or higher. SEEK IMMEDIATE MEDICAL CARE IF:   You cough up bloody sputum that had not been present before.  You develop fever of 102 F (38.9 C) or greater.  You develop worsening pain at or near the incision site. MAKE SURE YOU:   Understand these instructions.  Will watch your condition.  Will get help right away if you are not doing well or get worse. Document Released: 01/16/2007 Document Revised: 11/28/2011 Document Reviewed: 03/19/2007 [facility] Patient Information 2014 [place], [place].   ________________________________________________________________________

## 2019-07-22 ENCOUNTER — Encounter (HOSPITAL_COMMUNITY): Payer: Self-pay

## 2019-07-22 ENCOUNTER — Other Ambulatory Visit: Payer: Self-pay

## 2019-07-22 ENCOUNTER — Encounter (HOSPITAL_COMMUNITY)
Admission: RE | Admit: 2019-07-22 | Discharge: 2019-07-22 | Disposition: A | Discharge status: Home / Self Care | Payer: Medicare Other | Source: Ambulatory Visit | Attending: Orthopedic Surgery | Admitting: Orthopedic Surgery

## 2019-07-22 DIAGNOSIS — G4733 Obstructive sleep apnea (adult) (pediatric): Secondary | ICD-10-CM | POA: Insufficient documentation

## 2019-07-22 DIAGNOSIS — Z7982 Long term (current) use of aspirin: Secondary | ICD-10-CM | POA: Diagnosis not present

## 2019-07-22 DIAGNOSIS — I440 Atrioventricular block, first degree: Secondary | ICD-10-CM

## 2019-07-22 DIAGNOSIS — Z7984 Long term (current) use of oral hypoglycemic drugs: Secondary | ICD-10-CM | POA: Diagnosis not present

## 2019-07-22 DIAGNOSIS — M19212 Secondary osteoarthritis, left shoulder: Secondary | ICD-10-CM | POA: Diagnosis not present

## 2019-07-22 DIAGNOSIS — Z79899 Other long term (current) drug therapy: Secondary | ICD-10-CM | POA: Diagnosis not present

## 2019-07-22 DIAGNOSIS — Z01818 Encounter for other preprocedural examination: Secondary | ICD-10-CM | POA: Diagnosis not present

## 2019-07-22 DIAGNOSIS — R001 Bradycardia, unspecified: Secondary | ICD-10-CM

## 2019-07-22 DIAGNOSIS — E119 Type 2 diabetes mellitus without complications: Secondary | ICD-10-CM | POA: Insufficient documentation

## 2019-07-22 HISTORY — DX: Bradycardia, unspecified: R00.1

## 2019-07-22 HISTORY — DX: Pneumonia, unspecified organism: J18.9

## 2019-07-22 HISTORY — DX: Paresthesia of skin: R20.2

## 2019-07-22 HISTORY — DX: Paresthesia of skin: R20.0

## 2019-07-22 HISTORY — DX: Atrioventricular block, first degree: I44.0

## 2019-07-22 LAB — CBC
HCT: 42.6 % (ref 39.0–52.0)
Hemoglobin: 13.1 g/dL (ref 13.0–17.0)
MCH: 30.6 pg (ref 26.0–34.0)
MCHC: 30.8 g/dL (ref 30.0–36.0)
MCV: 99.5 fL (ref 80.0–100.0)
Platelets: 269 10*3/uL (ref 150–400)
RBC: 4.28 MIL/uL (ref 4.22–5.81)
RDW: 13.8 % (ref 11.5–15.5)
WBC: 6.7 10*3/uL (ref 4.0–10.5)
nRBC: 0 % (ref 0.0–0.2)

## 2019-07-22 LAB — BASIC METABOLIC PANEL
Anion gap: 9 (ref 5–15)
BUN: 14 mg/dL (ref 8–23)
CO2: 28 mmol/L (ref 22–32)
Calcium: 9.2 mg/dL (ref 8.9–10.3)
Chloride: 103 mmol/L (ref 98–111)
Creatinine, Ser: 1.18 mg/dL (ref 0.61–1.24)
GFR calc Af Amer: >60 mL/min (ref >60)
GFR calc non Af Amer: >60 mL/min (ref >60)
Glucose, Bld: 131 mg/dL — ABNORMAL HIGH (ref 70–99)
Potassium: 5.1 mmol/L (ref 3.5–5.1)
Sodium: 140 mmol/L (ref 135–145)

## 2019-07-22 LAB — SURGICAL PCR SCREEN
MRSA, PCR: NEGATIVE
Staphylococcus aureus: NEGATIVE

## 2019-07-22 LAB — GLUCOSE, CAPILLARY: Glucose-Capillary: 153 mg/dL — ABNORMAL HIGH (ref 70–99)

## 2019-07-22 NOTE — Progress Notes (Signed)
SPOKE W/  Seth Lewis     SCREENING SYMPTOMS OF COVID 19:   COUGH--NO  RUNNY NOSE--- NO  SORE THROAT--- NO  NASAL CONGESTION----NO  SNEEZING----NO  SHORTNESS OF BREATH---NO  DIFFICULTY BREATHING---NO  TEMP >100.0 -----NO  UNEXPLAINED BODY ACHES------NO  CHILLS -------- NO  HEADACHES ---------NO  LOSS OF SMELL/ TASTE --------NO    HAVE YOU OR ANY FAMILY MEMBER TRAVELLED PAST 14 DAYS OUT OF THE   COUNTY---NO STATE----NO COUNTRY----NO HAVE YOU OR ANY FAMILY MEMBER BEEN EXPOSED TO ANYONE WITH COVID 19? NO

## 2019-07-22 NOTE — Progress Notes (Signed)
PCP - Dr. Oneida Arenas Cardiologist - Dr. Adrian Prows  Chest x-ray - 11/23/2018 in epic EKG - 07/22/2019 in epic Stress Test - greater than  2 years ECHO - N/A Cardiac Cath - N/A  Sleep Study - Yes CPAP - Yes  Fasting Blood Sugar - 110-130 Checks Blood Sugar __once a month___   Blood Thinner Instructions: N/A Aspirin Instructions:Yes Last Dose:Last dose 07/18/2019  Anesthesia review: EKG first degree AV block EKG 07/22/2019, OSA, DM, HTN  Patient denies shortness of breath, fever, cough and chest pain at PAT appointment   Patient verbalized understanding of instructions that were given to them at the PAT appointment. Patient was also instructed that they will need to review over the PAT instructions again at home before surgery.

## 2019-07-26 ENCOUNTER — Other Ambulatory Visit (HOSPITAL_COMMUNITY)
Admission: RE | Admit: 2019-07-26 | Discharge: 2019-07-26 | Disposition: A | Discharge status: Home / Self Care | Payer: Medicare Other | Source: Ambulatory Visit | Attending: Orthopedic Surgery | Admitting: Orthopedic Surgery

## 2019-07-26 DIAGNOSIS — Z01812 Encounter for preprocedural laboratory examination: Secondary | ICD-10-CM | POA: Diagnosis present

## 2019-07-26 DIAGNOSIS — Z20828 Contact with and (suspected) exposure to other viral communicable diseases: Secondary | ICD-10-CM | POA: Insufficient documentation

## 2019-07-27 LAB — NOVEL CORONAVIRUS, NAA (HOSP ORDER, SEND-OUT TO REF LAB; TAT 18-24 HRS): SARS-CoV-2, NAA: NOT DETECTED

## 2019-07-29 NOTE — Progress Notes (Signed)
Seth Lewis made aware of time change, he will arrive at 9:15AM and drink pre surgery drink and stop fluid intake at 8:45AM. He verbalized understanding.

## 2019-07-30 ENCOUNTER — Inpatient Hospital Stay (HOSPITAL_COMMUNITY): Payer: Medicare Other | Admitting: Physician Assistant

## 2019-07-30 ENCOUNTER — Inpatient Hospital Stay (HOSPITAL_COMMUNITY): Payer: Medicare Other | Admitting: Certified Registered Nurse Anesthetist

## 2019-07-30 ENCOUNTER — Inpatient Hospital Stay (HOSPITAL_COMMUNITY)
Admission: RE | Admit: 2019-07-30 | Discharge: 2019-07-31 | DRG: 483 | Disposition: A | Discharge status: Home / Self Care | Payer: Medicare Other | Attending: Orthopedic Surgery | Admitting: Orthopedic Surgery

## 2019-07-30 ENCOUNTER — Other Ambulatory Visit: Payer: Self-pay

## 2019-07-30 ENCOUNTER — Encounter (HOSPITAL_COMMUNITY)
Admission: RE | Disposition: A | Discharge status: Home / Self Care | Payer: Self-pay | Source: Home / Self Care | Attending: Orthopedic Surgery

## 2019-07-30 ENCOUNTER — Inpatient Hospital Stay (HOSPITAL_COMMUNITY): Payer: Medicare Other

## 2019-07-30 ENCOUNTER — Encounter (HOSPITAL_COMMUNITY): Payer: Self-pay | Admitting: Emergency Medicine

## 2019-07-30 DIAGNOSIS — Z96611 Presence of right artificial shoulder joint: Secondary | ICD-10-CM | POA: Diagnosis present

## 2019-07-30 DIAGNOSIS — Z87442 Personal history of urinary calculi: Secondary | ICD-10-CM

## 2019-07-30 DIAGNOSIS — M19212 Secondary osteoarthritis, left shoulder: Secondary | ICD-10-CM

## 2019-07-30 DIAGNOSIS — Z20828 Contact with and (suspected) exposure to other viral communicable diseases: Secondary | ICD-10-CM | POA: Diagnosis present

## 2019-07-30 DIAGNOSIS — Z9049 Acquired absence of other specified parts of digestive tract: Secondary | ICD-10-CM | POA: Diagnosis not present

## 2019-07-30 DIAGNOSIS — M19012 Primary osteoarthritis, left shoulder: Secondary | ICD-10-CM | POA: Diagnosis present

## 2019-07-30 DIAGNOSIS — Z809 Family history of malignant neoplasm, unspecified: Secondary | ICD-10-CM | POA: Diagnosis not present

## 2019-07-30 DIAGNOSIS — Z7982 Long term (current) use of aspirin: Secondary | ICD-10-CM | POA: Diagnosis not present

## 2019-07-30 DIAGNOSIS — Z79891 Long term (current) use of opiate analgesic: Secondary | ICD-10-CM | POA: Diagnosis not present

## 2019-07-30 DIAGNOSIS — Z6829 Body mass index (BMI) 29.0-29.9, adult: Secondary | ICD-10-CM

## 2019-07-30 DIAGNOSIS — F419 Anxiety disorder, unspecified: Secondary | ICD-10-CM | POA: Diagnosis present

## 2019-07-30 DIAGNOSIS — Z87891 Personal history of nicotine dependence: Secondary | ICD-10-CM | POA: Diagnosis not present

## 2019-07-30 DIAGNOSIS — E785 Hyperlipidemia, unspecified: Secondary | ICD-10-CM | POA: Diagnosis present

## 2019-07-30 DIAGNOSIS — I119 Hypertensive heart disease without heart failure: Secondary | ICD-10-CM | POA: Diagnosis present

## 2019-07-30 DIAGNOSIS — E119 Type 2 diabetes mellitus without complications: Secondary | ICD-10-CM

## 2019-07-30 DIAGNOSIS — Z79899 Other long term (current) drug therapy: Secondary | ICD-10-CM

## 2019-07-30 DIAGNOSIS — Z79890 Hormone replacement therapy (postmenopausal): Secondary | ICD-10-CM | POA: Diagnosis not present

## 2019-07-30 DIAGNOSIS — G4733 Obstructive sleep apnea (adult) (pediatric): Secondary | ICD-10-CM

## 2019-07-30 DIAGNOSIS — M19019 Primary osteoarthritis, unspecified shoulder: Secondary | ICD-10-CM | POA: Diagnosis present

## 2019-07-30 DIAGNOSIS — F4310 Post-traumatic stress disorder, unspecified: Secondary | ICD-10-CM | POA: Diagnosis present

## 2019-07-30 DIAGNOSIS — Z9989 Dependence on other enabling machines and devices: Secondary | ICD-10-CM

## 2019-07-30 DIAGNOSIS — E669 Obesity, unspecified: Secondary | ICD-10-CM | POA: Diagnosis present

## 2019-07-30 DIAGNOSIS — Z981 Arthrodesis status: Secondary | ICD-10-CM

## 2019-07-30 DIAGNOSIS — E291 Testicular hypofunction: Secondary | ICD-10-CM | POA: Diagnosis present

## 2019-07-30 DIAGNOSIS — Z96612 Presence of left artificial shoulder joint: Secondary | ICD-10-CM

## 2019-07-30 HISTORY — PX: REVERSE SHOULDER ARTHROPLASTY: SHX5054

## 2019-07-30 LAB — GLUCOSE, CAPILLARY
Glucose-Capillary: 126 mg/dL — ABNORMAL HIGH (ref 70–99)
Glucose-Capillary: 137 mg/dL — ABNORMAL HIGH (ref 70–99)

## 2019-07-30 SURGERY — ARTHROPLASTY, SHOULDER, TOTAL, REVERSE
Anesthesia: General | Site: Shoulder | Laterality: Left

## 2019-07-30 MED ORDER — DOCUSATE SODIUM 100 MG PO CAPS
100.0000 mg | ORAL_CAPSULE | Freq: Two times a day (BID) | ORAL | Status: DC
  Administered 2019-07-30: 100 mg via ORAL
  Filled 2019-07-30: qty 1

## 2019-07-30 MED ORDER — ROCURONIUM BROMIDE 10 MG/ML (PF) SYRINGE
PREFILLED_SYRINGE | INTRAVENOUS | Status: DC | PRN
  Administered 2019-07-30: 70 mg via INTRAVENOUS

## 2019-07-30 MED ORDER — LIDOCAINE HCL (PF) 1 % IJ SOLN
INTRAMUSCULAR | Status: AC
  Filled 2019-07-30: qty 30

## 2019-07-30 MED ORDER — METFORMIN HCL ER 500 MG PO TB24
500.0000 mg | ORAL_TABLET | Freq: Two times a day (BID) | ORAL | Status: DC
  Administered 2019-07-30 – 2019-07-31 (×2): 500 mg via ORAL
  Filled 2019-07-30 (×2): qty 1

## 2019-07-30 MED ORDER — DIPHENHYDRAMINE HCL 12.5 MG/5ML PO ELIX: 12.5000 mg | ORAL_SOLUTION | ORAL | Status: DC | PRN

## 2019-07-30 MED ORDER — BUPIVACAINE LIPOSOME 1.3 % IJ SUSP
INTRAMUSCULAR | Status: DC | PRN
  Administered 2019-07-30 (×5): 2 mL via PERINEURAL

## 2019-07-30 MED ORDER — DEXAMETHASONE SODIUM PHOSPHATE 10 MG/ML IJ SOLN
INTRAMUSCULAR | Status: DC | PRN
  Administered 2019-07-30: 8 mg via INTRAVENOUS

## 2019-07-30 MED ORDER — LACTATED RINGERS IV SOLN
INTRAVENOUS | Status: DC
  Administered 2019-07-30 (×2): via INTRAVENOUS

## 2019-07-30 MED ORDER — LACTATED RINGERS IV SOLN
INTRAVENOUS | Status: DC
  Administered 2019-07-30: 23:00:00 via INTRAVENOUS

## 2019-07-30 MED ORDER — MENTHOL 3 MG MT LOZG: 1.0000 | LOZENGE | OROMUCOSAL | Status: DC | PRN

## 2019-07-30 MED ORDER — EPHEDRINE SULFATE-NACL 50-0.9 MG/10ML-% IV SOSY
PREFILLED_SYRINGE | INTRAVENOUS | Status: DC | PRN
  Administered 2019-07-30: 5 mg via INTRAVENOUS
  Administered 2019-07-30: 10 mg via INTRAVENOUS
  Administered 2019-07-30: 5 mg via INTRAVENOUS
  Administered 2019-07-30 (×2): 10 mg via INTRAVENOUS

## 2019-07-30 MED ORDER — PROMETHAZINE HCL 25 MG/ML IJ SOLN: 6.2500 mg | INTRAMUSCULAR | Status: DC | PRN

## 2019-07-30 MED ORDER — HYDROMORPHONE HCL 1 MG/ML IJ SOLN
0.2500 mg | INTRAMUSCULAR | Status: DC | PRN
  Administered 2019-07-30 (×4): 0.5 mg via INTRAVENOUS

## 2019-07-30 MED ORDER — KETOROLAC TROMETHAMINE 15 MG/ML IJ SOLN
INTRAMUSCULAR | Status: AC
  Filled 2019-07-30: qty 1

## 2019-07-30 MED ORDER — METOCLOPRAMIDE HCL 5 MG PO TABS: 5.0000 mg | ORAL_TABLET | Freq: Three times a day (TID) | ORAL | Status: DC | PRN

## 2019-07-30 MED ORDER — CEFAZOLIN SODIUM-DEXTROSE 2-4 GM/100ML-% IV SOLN
2.0000 g | INTRAVENOUS | Status: AC
  Administered 2019-07-30: 2 g via INTRAVENOUS
  Filled 2019-07-30: qty 100

## 2019-07-30 MED ORDER — NIFEDIPINE ER OSMOTIC RELEASE 60 MG PO TB24
60.0000 mg | ORAL_TABLET | Freq: Every evening | ORAL | Status: DC
  Administered 2019-07-30: 60 mg via ORAL
  Filled 2019-07-30: qty 1

## 2019-07-30 MED ORDER — KETOROLAC TROMETHAMINE 15 MG/ML IJ SOLN
15.0000 mg | Freq: Once | INTRAMUSCULAR | Status: AC
  Administered 2019-07-30: 15 mg via INTRAVENOUS

## 2019-07-30 MED ORDER — PHENOL 1.4 % MT LIQD: 1.0000 | OROMUCOSAL | Status: DC | PRN

## 2019-07-30 MED ORDER — TRANEXAMIC ACID-NACL 1000-0.7 MG/100ML-% IV SOLN
1000.0000 mg | INTRAVENOUS | Status: AC
  Administered 2019-07-30: 12:00:00 1000 mg via INTRAVENOUS
  Filled 2019-07-30: qty 100

## 2019-07-30 MED ORDER — POVIDONE-IODINE 10 % EX SWAB
2.0000 "application " | Freq: Once | CUTANEOUS | Status: AC
  Administered 2019-07-30: 2 via TOPICAL

## 2019-07-30 MED ORDER — PHENYLEPHRINE HCL-NACL 10-0.9 MG/250ML-% IV SOLN
INTRAVENOUS | Status: DC | PRN
  Administered 2019-07-30: 20 ug/min via INTRAVENOUS

## 2019-07-30 MED ORDER — MEPERIDINE HCL 50 MG/ML IJ SOLN: 6.2500 mg | INTRAMUSCULAR | Status: DC | PRN

## 2019-07-30 MED ORDER — PRAZOSIN HCL 1 MG PO CAPS
2.0000 mg | ORAL_CAPSULE | Freq: Every day | ORAL | Status: DC
  Administered 2019-07-30: 2 mg via ORAL
  Filled 2019-07-30: qty 2

## 2019-07-30 MED ORDER — PROPOFOL 10 MG/ML IV BOLUS
INTRAVENOUS | Status: DC | PRN
  Administered 2019-07-30: 150 mg via INTRAVENOUS

## 2019-07-30 MED ORDER — METHOCARBAMOL 500 MG PO TABS: 500.0000 mg | ORAL_TABLET | Freq: Four times a day (QID) | ORAL | Status: DC | PRN

## 2019-07-30 MED ORDER — FENTANYL CITRATE (PF) 100 MCG/2ML IJ SOLN
INTRAMUSCULAR | Status: DC | PRN
  Administered 2019-07-30: 100 ug via INTRAVENOUS

## 2019-07-30 MED ORDER — 0.9 % SODIUM CHLORIDE (POUR BTL) OPTIME
TOPICAL | Status: DC | PRN
  Administered 2019-07-30: 1000 mL

## 2019-07-30 MED ORDER — ACETAMINOPHEN 325 MG PO TABS: 325.0000 mg | ORAL_TABLET | Freq: Four times a day (QID) | ORAL | Status: DC | PRN

## 2019-07-30 MED ORDER — FENTANYL CITRATE (PF) 100 MCG/2ML IJ SOLN
50.0000 ug | INTRAMUSCULAR | Status: DC
  Administered 2019-07-30: 100 ug via INTRAVENOUS
  Filled 2019-07-30: qty 2

## 2019-07-30 MED ORDER — CHLORHEXIDINE GLUCONATE 4 % EX LIQD: 60.0000 mL | Freq: Once | CUTANEOUS | Status: DC

## 2019-07-30 MED ORDER — METOPROLOL TARTRATE 50 MG PO TABS
50.0000 mg | ORAL_TABLET | Freq: Two times a day (BID) | ORAL | Status: DC
  Administered 2019-07-30 – 2019-07-31 (×2): 50 mg via ORAL
  Filled 2019-07-30 (×2): qty 1

## 2019-07-30 MED ORDER — METOCLOPRAMIDE HCL 5 MG/ML IJ SOLN: 5.0000 mg | Freq: Three times a day (TID) | INTRAMUSCULAR | Status: DC | PRN

## 2019-07-30 MED ORDER — BUPIVACAINE HCL (PF) 0.25 % IJ SOLN
INTRAMUSCULAR | Status: AC
  Filled 2019-07-30: qty 30

## 2019-07-30 MED ORDER — ATORVASTATIN CALCIUM 40 MG PO TABS
40.0000 mg | ORAL_TABLET | Freq: Every day | ORAL | Status: DC
  Administered 2019-07-30: 40 mg via ORAL
  Filled 2019-07-30: qty 1

## 2019-07-30 MED ORDER — ACETAMINOPHEN 500 MG PO TABS
1000.0000 mg | ORAL_TABLET | Freq: Once | ORAL | Status: AC
  Administered 2019-07-30: 1000 mg via ORAL
  Filled 2019-07-30: qty 2

## 2019-07-30 MED ORDER — HYDROMORPHONE HCL 1 MG/ML IJ SOLN
INTRAMUSCULAR | Status: AC
  Filled 2019-07-30: qty 1

## 2019-07-30 MED ORDER — FENTANYL CITRATE (PF) 100 MCG/2ML IJ SOLN
INTRAMUSCULAR | Status: AC
  Filled 2019-07-30: qty 2

## 2019-07-30 MED ORDER — ONDANSETRON HCL 4 MG/2ML IJ SOLN
INTRAMUSCULAR | Status: DC | PRN
  Administered 2019-07-30: 4 mg via INTRAVENOUS

## 2019-07-30 MED ORDER — POLYETHYLENE GLYCOL 3350 17 G PO PACK: 17.0000 g | PACK | Freq: Every day | ORAL | Status: DC | PRN

## 2019-07-30 MED ORDER — BUPIVACAINE-EPINEPHRINE (PF) 0.5% -1:200000 IJ SOLN
INTRAMUSCULAR | Status: DC | PRN
  Administered 2019-07-30 (×5): 3 mL via PERINEURAL

## 2019-07-30 MED ORDER — STERILE WATER FOR IRRIGATION IR SOLN
Status: DC | PRN
  Administered 2019-07-30: 1000 mL

## 2019-07-30 MED ORDER — METHOCARBAMOL 500 MG IVPB - SIMPLE MED
500.0000 mg | Freq: Four times a day (QID) | INTRAVENOUS | Status: DC | PRN
  Filled 2019-07-30: qty 50

## 2019-07-30 MED ORDER — SUGAMMADEX SODIUM 200 MG/2ML IV SOLN
INTRAVENOUS | Status: DC | PRN
  Administered 2019-07-30: 200 mg via INTRAVENOUS

## 2019-07-30 MED ORDER — PROPOFOL 10 MG/ML IV BOLUS
INTRAVENOUS | Status: AC
  Filled 2019-07-30: qty 20

## 2019-07-30 MED ORDER — ACETAMINOPHEN 500 MG PO TABS
1000.0000 mg | ORAL_TABLET | Freq: Three times a day (TID) | ORAL | Status: DC
  Administered 2019-07-30 – 2019-07-31 (×3): 1000 mg via ORAL
  Filled 2019-07-30 (×3): qty 2

## 2019-07-30 MED ORDER — CEFAZOLIN SODIUM-DEXTROSE 1-4 GM/50ML-% IV SOLN
1.0000 g | Freq: Four times a day (QID) | INTRAVENOUS | Status: AC
  Administered 2019-07-30 – 2019-07-31 (×3): 1 g via INTRAVENOUS
  Filled 2019-07-30 (×3): qty 50

## 2019-07-30 MED ORDER — ONDANSETRON HCL 4 MG/2ML IJ SOLN: 4.0000 mg | Freq: Four times a day (QID) | INTRAMUSCULAR | Status: DC | PRN

## 2019-07-30 MED ORDER — ONDANSETRON HCL 4 MG PO TABS: 4.0000 mg | ORAL_TABLET | Freq: Four times a day (QID) | ORAL | Status: DC | PRN

## 2019-07-30 MED ORDER — VENLAFAXINE HCL ER 75 MG PO CP24
75.0000 mg | ORAL_CAPSULE | Freq: Every day | ORAL | Status: DC
  Administered 2019-07-30: 75 mg via ORAL
  Filled 2019-07-30: qty 1

## 2019-07-30 MED ORDER — OXYCODONE HCL 5 MG PO TABS
5.0000 mg | ORAL_TABLET | ORAL | Status: DC | PRN
  Administered 2019-07-30 – 2019-07-31 (×3): 10 mg via ORAL
  Filled 2019-07-30 (×3): qty 2

## 2019-07-30 MED ORDER — ASPIRIN EC 81 MG PO TBEC
81.0000 mg | DELAYED_RELEASE_TABLET | Freq: Every day | ORAL | Status: DC
  Administered 2019-07-31: 81 mg via ORAL
  Filled 2019-07-30: qty 1

## 2019-07-30 MED ORDER — MIDAZOLAM HCL 2 MG/2ML IJ SOLN
1.0000 mg | INTRAMUSCULAR | Status: DC
  Administered 2019-07-30: 2 mg via INTRAVENOUS
  Filled 2019-07-30: qty 2

## 2019-07-30 MED ORDER — HYDROMORPHONE HCL 1 MG/ML IJ SOLN: 0.5000 mg | INTRAMUSCULAR | Status: DC | PRN

## 2019-07-30 MED ORDER — KETOROLAC TROMETHAMINE 15 MG/ML IJ SOLN
7.5000 mg | Freq: Four times a day (QID) | INTRAMUSCULAR | Status: DC
  Administered 2019-07-30 – 2019-07-31 (×3): 7.5 mg via INTRAVENOUS
  Filled 2019-07-30 (×3): qty 1

## 2019-07-30 SURGICAL SUPPLY — 61 items
AID PSTN UNV HD RSTRNT DISP (MISCELLANEOUS) ×1
APL PRP STRL LF DISP 70% ISPRP (MISCELLANEOUS) ×1
BAG SPEC THK2 15X12 ZIP CLS (MISCELLANEOUS) ×1
BAG ZIPLOCK 12X15 (MISCELLANEOUS) ×2 IMPLANT
BASEPLATE GLENOSPHERE 25 (Plate) ×1 IMPLANT
BEARING HUMERAL +3 40D (Joint) ×1 IMPLANT
BIT DRILL TWIST 2.7 (BIT) ×1 IMPLANT
BLADE SAW SAG 73X25 THK (BLADE) ×1
BLADE SAW SGTL 73X25 THK (BLADE) ×1 IMPLANT
BRNG HUM +3 40 RVRS SHLDR (Joint) ×1 IMPLANT
CHLORAPREP W/TINT 26 (MISCELLANEOUS) ×2 IMPLANT
CLSR STERI-STRIP ANTIMIC 1/2X4 (GAUZE/BANDAGES/DRESSINGS) ×2 IMPLANT
COVER BACK TABLE 60X90IN (DRAPES) ×2 IMPLANT
COVER MAYO STAND STRL (DRAPES) ×1 IMPLANT
COVER SURGICAL LIGHT HANDLE (MISCELLANEOUS) ×2 IMPLANT
COVER WAND RF STERILE (DRAPES) IMPLANT
DECANTER SPIKE VIAL GLASS SM (MISCELLANEOUS) IMPLANT
DIAL VERSA SHOULDER 40 STD (Joint) ×1 IMPLANT
DRAPE U-SHAPE 47X51 STRL (DRAPES) ×2 IMPLANT
DRSG ADAPTIC 3X8 NADH LF (GAUZE/BANDAGES/DRESSINGS) ×1 IMPLANT
DRSG MEPILEX BORDER 4X8 (GAUZE/BANDAGES/DRESSINGS) ×1 IMPLANT
DRSG PAD ABDOMINAL 8X10 ST (GAUZE/BANDAGES/DRESSINGS) ×2 IMPLANT
ELECT BLADE TIP CTD 4 INCH (ELECTRODE) ×1 IMPLANT
ELECT REM PT RETURN 15FT ADLT (MISCELLANEOUS) ×2 IMPLANT
GAUZE SPONGE 4X4 12PLY STRL (GAUZE/BANDAGES/DRESSINGS) ×1 IMPLANT
GLOVE BIO SURGEON STRL SZ7.5 (GLOVE) ×4 IMPLANT
GLOVE BIOGEL PI IND STRL 8 (GLOVE) ×2 IMPLANT
GLOVE BIOGEL PI INDICATOR 8 (GLOVE) ×2
GOWN STRL REUS W/ TWL LRG LVL3 (GOWN DISPOSABLE) ×2 IMPLANT
GOWN STRL REUS W/TWL LRG LVL3 (GOWN DISPOSABLE) ×4
HOOD PEEL AWAY FLYTE STAYCOOL (MISCELLANEOUS) ×5 IMPLANT
KIT BASIN OR (CUSTOM PROCEDURE TRAY) ×2 IMPLANT
KIT TURNOVER KIT A (KITS) IMPLANT
MANIFOLD NEPTUNE II (INSTRUMENTS) ×2 IMPLANT
NS IRRIG 1000ML POUR BTL (IV SOLUTION) ×2 IMPLANT
PACK SHOULDER (CUSTOM PROCEDURE TRAY) ×2 IMPLANT
PIN THREADED REVERSE (PIN) ×1 IMPLANT
PROTECTOR NERVE ULNAR (MISCELLANEOUS) ×2 IMPLANT
RESTRAINT HEAD UNIVERSAL NS (MISCELLANEOUS) ×2 IMPLANT
SCREW BONE STRL 6.5MMX30MM (Screw) ×1 IMPLANT
SCREW LOCKING 4.75MMX15MM (Screw) ×1 IMPLANT
SCREW LOCKING NS 4.75MMX20MM (Screw) ×2 IMPLANT
SLING ARM IMMOBILIZER LRG (SOFTGOODS) ×2 IMPLANT
SLING ARM IMMOBILIZER MED (SOFTGOODS) ×1 IMPLANT
SPONGE LAP 18X18 RF (DISPOSABLE) ×2 IMPLANT
STEM SHOULDER 16MMX55MM LONG (Stem) ×1 IMPLANT
SUCTION FRAZIER HANDLE 12FR (TUBING) ×1
SUCTION TUBE FRAZIER 12FR DISP (TUBING) ×1 IMPLANT
SUPPORT WRAP ARM LG (MISCELLANEOUS) IMPLANT
SUT FIBERWIRE #2 38 T-5 BLUE (SUTURE) ×4
SUT MNCRL AB 4-0 PS2 18 (SUTURE) ×2 IMPLANT
SUT MON AB 2-0 CT1 36 (SUTURE) ×2 IMPLANT
SUT VIC AB 0 CT1 27 (SUTURE) ×2
SUT VIC AB 0 CT1 27XBRD ANBCTR (SUTURE) ×1 IMPLANT
SUT VIC AB 2-0 CT1 27 (SUTURE) ×2
SUT VIC AB 2-0 CT1 TAPERPNT 27 (SUTURE) IMPLANT
SUTURE FIBERWR #2 38 T-5 BLUE (SUTURE) ×2 IMPLANT
TOWEL OR 17X26 10 PK STRL BLUE (TOWEL DISPOSABLE) ×2 IMPLANT
TOWEL OR NON WOVEN STRL DISP B (DISPOSABLE) ×2 IMPLANT
TRAY HUM MINI SHOULDER +0 40D (Shoulder) ×1 IMPLANT
WATER STERILE IRR 1000ML POUR (IV SOLUTION) ×4 IMPLANT

## 2019-07-30 NOTE — Transfer of Care (Signed)
Immediate Anesthesia Transfer of Care Note  Patient: Seth Lewis  Procedure(s) Performed: Procedure(s): LEFT REVERSE SHOULDER ARTHROPLASTY (Left)  Patient Location: PACU  Anesthesia Type:General  Level of Consciousness:  sedated, patient cooperative and responds to stimulation  Airway & Oxygen Therapy:Patient Spontanous Breathing and Patient connected to face mask oxgen  Post-op Assessment:  Report given to PACU RN and Post -op Vital signs reviewed and stable  Post vital signs:  Reviewed and stable  Last Vitals:  Vitals:   07/30/19 1055 07/30/19 1056  BP: (!) 145/87   Pulse: (!) 48   Resp: (!) 7 17  Temp:    SpO2: 42%     Complications: No apparent anesthesia complications

## 2019-07-30 NOTE — Progress Notes (Signed)
AssistedDr. Hatchett with left, ultrasound guided, interscalene  block. Side rails up, monitors on throughout procedure. See vital signs in flow sheet. Tolerated Procedure well.  

## 2019-07-30 NOTE — Anesthesia Procedure Notes (Signed)
Procedure Name: Intubation Date/Time: 07/30/2019 11:17 AM Performed by: Anne Fu, CRNA Pre-anesthesia Checklist: Patient identified, Emergency Drugs available, Suction available, Patient being monitored and Timeout performed Patient Re-evaluated:Patient Re-evaluated prior to induction Oxygen Delivery Method: Circle system utilized Preoxygenation: Pre-oxygenation with 100% oxygen Induction Type: IV induction Ventilation: Mask ventilation without difficulty Laryngoscope Size: Mac, 4 and Glidescope Grade View: Grade I Tube type: Oral Tube size: 7.5 mm Number of attempts: 1 Airway Equipment and Method: Video-laryngoscopy Placement Confirmation: ETT inserted through vocal cords under direct vision,  positive ETCO2 and breath sounds checked- equal and bilateral Tube secured with: Tape Dental Injury: Teeth and Oropharynx as per pre-operative assessment  Difficulty Due To: Difficulty was anticipated and Difficult Airway- due to anterior larynx Comments: DL X 1 with MAC 4 glidescope with noted GRADE 1 view with noted anterior larynx placement, required steep anterior guidance to pass tube.  Dentition as per pre-op post intubation, noted bilat breath sounds post placement with noted ETCO2.

## 2019-07-30 NOTE — Discharge Instructions (Signed)
Your prescriptions were sent to your pharmacy on 07/29/19. Keep sling on at all times.  Do not bear weight with arm. Keep your dressing on and dry until follow up. Take pain medicine as needed with the goal of transitioning to over the counter medicines.  If needed, you may increase breakthrough pain medication (oxycodone) for the first few days post op - up to 2 tablets every 4 hours.  Stop this medication as soon as you are able.  INSTRUCTIONS AFTER JOINT REPLACEMENT   o Remove items at home which could result in a fall. This includes throw rugs or furniture in walking pathways o ICE to the affected joint every three hours while awake for 30 minutes at a time, for at least the first 3-5 days, and then as needed for pain and swelling.  Continue to use ice for pain and swelling. You may notice swelling that will progress down to the foot and ankle.  This is normal after surgery.  Elevate your leg when you are not up walking on it.   o Continue to use the breathing machine you got in the hospital (incentive spirometer) which will help keep your temperature down.  It is common for your temperature to cycle up and down following surgery, especially at night when you are not up moving around and exerting yourself.  The breathing machine keeps your lungs expanded and your temperature down.   DIET:  As you were doing prior to hospitalization, we recommend a well-balanced diet.  DRESSING / WOUND CARE / SHOWERING  Keep dressing dry.  You may use an occlusive plastic wrap (Press'n Seal for example) with blue painter's tape at edges, NO SOAKING/SUBMERGING IN THE BATHTUB.  If the bandage gets wet, change with a clean dry gauze.  If the incision gets wet, pat the wound dry with a clean towel.  ACTIVITY  o Increase activity slowly as tolerated, but follow the weight bearing instructions below.   o No driving for 6 weeks or until further direction given by your physician.  You cannot drive while taking  narcotics.  o No lifting or carrying greater than 10 lbs. until further directed by your surgeon. o Avoid periods of inactivity such as sitting longer than an hour when not asleep. This helps prevent blood clots.  o You may return to work once you are authorized by your doctor.     WEIGHT BEARING  Do not bear weight with arm.  Maintain sling at all times.  A rehabilitation program following joint replacement surgery can speed recovery and prevent re-injury in the future due to weakened muscles. Contact your doctor or a physical therapist for more information on knee rehabilitation.    CONSTIPATION  Constipation is defined medically as fewer than three stools per week and severe constipation as less than one stool per week.  Even if you have a regular bowel pattern at home, your normal regimen is likely to be disrupted due to multiple reasons following surgery.  Combination of anesthesia, postoperative narcotics, change in appetite and fluid intake all can affect your bowels.   YOU MUST use at least one of the following options; they are listed in order of increasing strength to get the job done.  They are all available over the counter, and you may need to use some, POSSIBLY even all of these options:    Drink plenty of fluids (prune juice may be helpful) and high fiber foods Colace 100 mg by mouth twice a day  Senokot for constipation as directed and as needed Dulcolax (bisacodyl), take with full glass of water  Miralax (polyethylene glycol) once or twice a day as needed.  If you have tried all these things and are unable to have a bowel movement in the first 3-4 days after surgery call either your surgeon or your primary doctor.    If you experience loose stools or diarrhea, hold the medications until you stool forms back up.  If your symptoms do not get better within 1 week or if they get worse, check with your doctor.  If you experience "the worst abdominal pain ever" or develop nausea  or vomiting, please contact the office immediately for further recommendations for treatment.   ITCHING:  If you experience itching with your medications, try taking only a single pain pill, or even half a pain pill at a time.  You can also use Benadryl over the counter for itching or also to help with sleep.   TED HOSE STOCKINGS:  Use stockings on both legs until for at least 2 weeks or as directed by physician office. They may be removed at night for sleeping.  MEDICATIONS:  See your medication summary on the After Visit Summary that nursing will review with you.  You may have some home medications which will be placed on hold until you complete the course of blood thinner medication.  It is important for you to complete the blood thinner medication as prescribed.  PRECAUTIONS:  If you experience chest pain or shortness of breath - call 911 immediately for transfer to the hospital emergency department.   If you develop a fever greater that 101 F, purulent drainage from wound, increased redness or drainage from wound, foul odor from the wound/dressing, or calf pain - CONTACT YOUR SURGEON.                                                   FOLLOW-UP APPOINTMENTS:  If you do not already have a post-op appointment, please call the office for an appointment to be seen by your surgeon.  Guidelines for how soon to be seen are listed in your After Visit Summary, but are typically between 1-4 weeks after surgery.  OTHER INSTRUCTIONS:     MAKE SURE YOU:   Understand these instructions.   Get help right away if you are not doing well or get worse.    Thank you for letting us be a part of your medical care team.  It is a privilege we respect greatly.  We hope these instructions will help you stay on track for a fast and full recovery!

## 2019-07-30 NOTE — Progress Notes (Signed)
Pt. able to place on CPAP independently, made aware to notify if needed, RT to recheck on last rounds.

## 2019-07-30 NOTE — Op Note (Signed)
07/30/2019  12:59 PM  PATIENT:  Seth Lewis    PRE-OPERATIVE DIAGNOSIS:  OSTEOARTHRITIS LEFT SHOULDER  POST-OPERATIVE DIAGNOSIS:  Same  PROCEDURE:  LEFT REVERSE SHOULDER ARTHROPLASTY  SURGEON:  Renette Butters, MD  PHYSICIAN ASSISTANT: Roxan Hockey, PA-C, he was present and scrubbed throughout the case, critical for completion in a timely fashion, and for retraction, instrumentation, and closure.   ANESTHESIA:   General  PREOPERATIVE INDICATIONS:  Seth Lewis is a  73 y.o. male with a diagnosis of OSTEOARTHRITIS LEFT SHOULDER who failed conservative measures and elected for surgical management.    The risks benefits and alternatives were discussed with the patient preoperatively including but not limited to the risks of infection, bleeding, nerve injury, cardiopulmonary complications, the need for revision surgery, dislocation, brachial plexus palsy, incomplete relief of pain, among others, and the patient was willing to proceed.  OPERATIVE IMPLANTS: Biomet size 16 humeral stem press-fit micro with a 40 mm standard reverse shoulder arthroplasty tray with a +3 liner and a 40 mm glenosphere with a 40 baseplate and 3 locking screws and one central nonlocking screw.  OPERATIVE PROCEDURE: The patient was brought to the operating room and placed in the supine position. General anesthesia was administered. IV antibiotics were given. A Foley was placed. Time out was performed. The upper extremity was prepped and draped in usual sterile fashion. The patient was in a beachchair position. Deltopectoral approach was carried out. The biceps was tenodesed to the pectoralis tendon with #2 Fiberwire. The subscapularis was released off of the bone.   I then performed circumferential releases of the humerus, and then dislocated the head, and then reamed with the reamer to the above named size.  I then applied the jig, and cut the humeral head in 30 of retroversion, and then turned my  attention to the glenoid.  Deep retractors were placed, and I resected the labrum, and then placed a guidepin into the center position on the glenoid, with slight inferior inclination. I then reamed over the guidepin, and this created a small metaphyseal cancellus blush inferiorly, removing just the cartilage to the subchondral bone superiorly. The base plate was selected and impacted place, and then I secured it centrally with a nonlocking screw, and I had excellent purchase both inferiorly and superiorly. I placed a short locking screws on anterior and posterior aspects.  I then turned my attention to the glenosphere, and impacted this into place, placing slight inferior offset (set on B).   The glenoid sphere was completely seated, and had engagement of the [facility] taper. I then turned my attention back to the humerus.  I sequentially broached, and then trialed, and was found to restore soft tissue tension, and it had 2 finger tightness. Therefore the above named components were selected. The shoulder felt stable throughout functional motion.  I then impacted the real prosthesis into place, as well as the real humeral tray, and reduced the shoulder. The shoulder had excellent motion, and was stable, and I irrigated the wounds copiously.   Before I placed the real prosthesis I had also placed a total of 3 #2 FiberWire through drill holes in the humerus. I then used these to repair the subscapularis. This came down to bone.  I then irrigated the shoulder copiously once more, repaired the deltopectoral interval with Vicryl followed by subcutaneous Vicryl with Steri-Strips and sterile gauze for the skin. The patient was awakened and returned back in stable and satisfactory condition. There no complications and He tolerated  the procedure well.  POST OP PLAN: sling full time, mobilize for dvt px

## 2019-07-30 NOTE — Anesthesia Postprocedure Evaluation (Signed)
Anesthesia Post Note  Patient: Seth Lewis  Procedure(s) Performed: LEFT REVERSE SHOULDER ARTHROPLASTY (Left Shoulder)     Patient location during evaluation: PACU Anesthesia Type: General Level of consciousness: sedated Pain management: pain level controlled Vital Signs Assessment: post-procedure vital signs reviewed and stable Respiratory status: spontaneous breathing Cardiovascular status: stable Postop Assessment: no apparent nausea or vomiting Anesthetic complications: no    Last Vitals:  Vitals:   07/30/19 1430 07/30/19 1445  BP: 130/81 (!) 147/75  Pulse: (!) 57 (!) 54  Resp: 12 11  Temp:  36.5 C  SpO2: 98% 98%    Last Pain:  Vitals:   07/30/19 1445  TempSrc:   PainSc: 5    Pain Goal: Patients Stated Pain Goal: 4 (07/30/19 1014)                 Huston Foley

## 2019-07-30 NOTE — Interval H&P Note (Signed)
I participated in the care of this patient and agree with the above history, physical and evaluation. I performed a review of the history and a physical exam as detailed   Brianny Soulliere Daniel Viktorya Arguijo MD  

## 2019-07-30 NOTE — Progress Notes (Signed)
Pt. s/u with CPAP for h/s, RT to recheck on rounds.

## 2019-07-30 NOTE — Plan of Care (Signed)
Continue with current POC

## 2019-07-30 NOTE — Anesthesia Preprocedure Evaluation (Addendum)
Anesthesia Evaluation  Patient identified by MRN, date of birth, ID band Patient awake    Reviewed: Allergy & Precautions, NPO status , Patient's Chart, lab work & pertinent test results, reviewed documented beta blocker date and time   Airway Mallampati: III       Dental no notable dental hx. (+) Teeth Intact   Pulmonary sleep apnea and Continuous Positive Airway Pressure Ventilation , former smoker,    Pulmonary exam normal breath sounds clear to auscultation       Cardiovascular hypertension, Pt. on home beta blockers Normal cardiovascular exam Rhythm:Regular Rate:Normal     Neuro/Psych PSYCHIATRIC DISORDERS Anxiety    GI/Hepatic negative GI ROS, Neg liver ROS,   Endo/Other  diabetes, Type 2, Oral Hypoglycemic Agents  Renal/GU      Musculoskeletal  (+) Arthritis , Osteoarthritis,    Abdominal Normal abdominal exam  (+)   Peds  Hematology negative hematology ROS (+)   Anesthesia Other Findings   Reproductive/Obstetrics                            Anesthesia Physical Anesthesia Plan  ASA: II  Anesthesia Plan: General   Post-op Pain Management:  Regional for Post-op pain   Induction: Intravenous  PONV Risk Score and Plan: 2 and Ondansetron, Midazolam and Treatment may vary due to age or medical condition  Airway Management Planned: Oral ETT and Video Laryngoscope Planned  Additional Equipment: None  Intra-op Plan:   Post-operative Plan: Extubation in OR  Informed Consent: I have reviewed the patients History and Physical, chart, labs and discussed the procedure including the risks, benefits and alternatives for the proposed anesthesia with the patient or authorized representative who has indicated his/her understanding and acceptance.     Dental advisory given  Plan Discussed with: CRNA  Anesthesia Plan Comments:        Anesthesia Quick Evaluation

## 2019-07-30 NOTE — Anesthesia Procedure Notes (Addendum)
Anesthesia Regional Block: Interscalene brachial plexus block   Pre-Anesthetic Checklist: ,, timeout performed, Correct Patient, Correct Site, Correct Laterality, Correct Procedure, Correct Position, site marked, Risks and benefits discussed,  Surgical consent,  Pre-op evaluation,  At surgeon's request and post-op pain management  Laterality: Left and Upper  Prep: chloraprep       Needles:  Injection technique: Single-shot  Needle Type: Echogenic Stimulator Needle     Needle Length: 10cm  Needle Gauge: 21   Needle insertion depth: 1 cm   Additional Needles:   Procedures:,,,, ultrasound used (permanent image in chart),,,,  Narrative:  Start time: 07/30/2019 10:43 AM End time: 07/30/2019 10:53 AM Injection made incrementally with aspirations every 5 mL.  Performed by: Personally  Anesthesiologist: Lyn Hollingshead, MD

## 2019-07-31 LAB — GLUCOSE, CAPILLARY: Glucose-Capillary: 122 mg/dL — ABNORMAL HIGH (ref 70–99)

## 2019-07-31 NOTE — Discharge Summary (Signed)
Discharge Summary  Patient ID: Seth Lewis MRN: [id number] DOB/AGE: 74-Nov-1947 74 y.o.  Admit date: 07/30/2019 Discharge date: 07/31/2019  Admission Diagnoses:  Secondary osteoarthritis of left shoulder due to rotator cuff arthropathy  Discharge Diagnoses:  Principal Problem:   Secondary osteoarthritis of left shoulder due to rotator cuff arthropathy Active Problems:   History of kidney stones   OSA on CPAP   Type 2 diabetes mellitus (HCC)   PTSD (post-traumatic stress disorder)   Primary osteoarthritis of shoulder   Past Medical History:  Diagnosis Date  . 1st degree AV block 07/22/2019   Noted on EKG   . Arthritis   . History of kidney stones    Multiple stones- no loss of kidney function  . History of pneumothorax    2005 w/ chest tube  . Hypertension   . Numbness and tingling in both hands   . OSA on CPAP    moderate osa per study 02-01-2011  . Pneumonia   . PTSD (post-traumatic stress disorder)   . Right hydrocele   . Sinus bradycardia by electrocardiogram 07/22/2019  . Type 2 diabetes mellitus (HCC)     Surgeries: Procedure(s): LEFT REVERSE SHOULDER ARTHROPLASTY on 07/30/2019   Consultants (if any):   Discharged Condition: Improved  Hospital Course: Seth Lewis is an 73 y.o. male who was admitted 07/30/2019 with a diagnosis of Secondary osteoarthritis of left shoulder due to rotator cuff arthropathy and went to the operating room on 07/30/2019 and underwent the above named procedures.    He was given perioperative antibiotics:  Anti-infectives (From admission, onward)   Start     Dose/Rate Route Frequency Ordered Stop   07/30/19 1730  ceFAZolin (ANCEF) IVPB 1 g/50 mL premix     1 g 100 mL/hr over 30 Minutes Intravenous Every 6 hours 07/30/19 1612 07/31/19 0703   07/30/19 1000  ceFAZolin (ANCEF) IVPB 2g/100 mL premix     2 g 200 mL/hr over 30 Minutes Intravenous On call to O.R. 07/30/19 1025 07/30/19 1121    .  He was given sequential  compression devices, early ambulation, and Aspirin for DVT prophylaxis.  He benefited maximally from the hospital stay and there were no complications.    Recent vital signs:  Vitals:   07/31/19 0142 07/31/19 0457  BP: 105/71 97/85  Pulse: (!) 58 (!) 59  Resp: 16 15  Temp: 97.9 F (36.6 C) 98.3 F (36.8 C)  SpO2: 98% 96%    Recent laboratory studies:  Lab Results  Component Value Date   HGB 13.1 07/22/2019   HGB 13.8 12/21/2016   HGB 14.6 03/30/2016   Lab Results  Component Value Date   WBC 6.7 07/22/2019   PLT 269 07/22/2019   Lab Results  Component Value Date   INR 1.11 09/27/2015   Lab Results  Component Value Date   NA 140 07/22/2019   K 5.1 07/22/2019   CL 103 07/22/2019   CO2 28 07/22/2019   BUN 14 07/22/2019   CREATININE 1.18 07/22/2019   GLUCOSE 131 (H) 07/22/2019    Discharge Medications:   Allergies as of 07/31/2019   No Known Allergies     Medication List    STOP taking these medications   traMADol 50 MG tablet Commonly known as: ULTRAM     TAKE these medications   AndroGel Pump 20.25 MG/ACT (1.62%) Gel Generic drug: Testosterone Apply 3 Squirts topically every other day. To shoulders   aspirin EC 81 MG tablet Take 81 mg by  mouth daily.   metFORMIN 500 MG 24 hr tablet Commonly known as: GLUCOPHAGE-XR Take 500 mg by mouth 2 (two) times daily.   metoprolol tartrate 100 MG tablet Commonly known as: LOPRESSOR Take 50 mg by mouth 2 (two) times daily.   multivitamin with minerals Tabs tablet Take 1 tablet by mouth daily.   NIFEdipine 60 MG 24 hr tablet Commonly known as: PROCARDIA XL/NIFEDICAL XL Take 60 mg by mouth every evening.   prazosin 1 MG capsule Commonly known as: MINIPRESS Take 2 mg by mouth at bedtime.   simvastatin 80 MG tablet Commonly known as: ZOCOR Take 40 mg by mouth 2 (two) times daily.   venlafaxine XR 75 MG 24 hr capsule Commonly known as: EFFEXOR-XR Take 75 mg by mouth at bedtime.   Vitamin D3 125 MCG  (5000 UT) Tabs Take 5,000 Units by mouth daily.       Diagnostic Studies: Dg Shoulder Left Port  Result Date: 07/30/2019 CLINICAL DATA:  Left shoulder arthroplasty. EXAM: LEFT SHOULDER COMPARISON:  CT 06/24/2019. FINDINGS: Total left shoulder arthroplasty. Hardware intact. Anatomic alignment. Prior cervical spine fusion. Cardiomegaly. Left base atelectasis/infiltrate left pleural effusion cannot be excluded. IMPRESSION: 1.  Total left shoulder arthroplasty with anatomic alignment. 2. Cardiomegaly. Left base atelectasis/infiltrate left pleural effusion cannot be excluded. Electronically Signed   By: Marcello Moores  Register   On: 07/30/2019 13:54    Disposition: Discharge disposition: 01-Home or Self Care       Discharge Instructions    Discharge patient   Complete by: As directed    Discharge disposition: 01-Home or Self Care   Discharge patient date: 07/31/2019      Follow-up Information    Renette Butters, MD.   Specialty: Orthopedic Surgery Contact information: [address] [address] [phone]            Signed: Prudencio Burly III PA-C 07/31/2019, 1:54 PM

## 2019-07-31 NOTE — Progress Notes (Signed)
    Subjective: Patient reports pain as mild to moderate, controlled.  Nerve block still working.  Tolerating diet.  Urinating.  No CP, SOB.    Objective:   VITALS:   Vitals:   07/30/19 1920 07/30/19 2253 07/31/19 0142 07/31/19 0457  BP: 120/69 140/83 105/71 97/85  Pulse: 62 64 (!) 58 (!) 59  Resp: 16 14 16 15   Temp: 97.9 F (36.6 C) 98 F (36.7 C) 97.9 F (36.6 C) 98.3 F (36.8 C)  TempSrc: Oral Oral Oral Oral  SpO2: 93% 97% 98% 96%  Weight:      Height:       CBC Latest Ref Rng & Units 07/22/2019 12/21/2016 03/30/2016  WBC 4.0 - 10.5 K/uL 6.7 6.5 -  Hemoglobin 13.0 - 17.0 g/dL 13.1 13.8 14.6  Hematocrit 39.0 - 52.0 % 42.6 42.6 43.0  Platelets 150 - 400 K/uL 269 249 -   BMP Latest Ref Rng & Units 07/22/2019 12/21/2016 03/30/2016  Glucose 70 - 99 mg/dL 131(H) 210(H) 176(H)  BUN 8 - 23 mg/dL 14 10 11   Creatinine 0.61 - 1.24 mg/dL 1.18 1.12 1.10  Sodium 135 - 145 mmol/L 140 141 137  Potassium 3.5 - 5.1 mmol/L 5.1 4.0 3.9  Chloride 98 - 111 mmol/L 103 102 99(L)  CO2 22 - 32 mmol/L 28 28 -  Calcium 8.9 - 10.3 mg/dL 9.2 9.1 -   Intake/Output      11/10 0701 - 11/11 0700 11/11 0701 - 11/12 0700   P.O. 360    I.V. (mL/kg) 2955.6 (28.8)    IV Piggyback 300.5    Total Intake(mL/kg) 3616.1 (35.3)    Urine (mL/kg/hr) 1125    Stool 0    Blood 150    Total Output 1275    Net +2341.1         Urine Occurrence 0 x    Stool Occurrence 1 x       Physical Exam: General: NAD.  Upright in bed.  Calm, conversant. Resp: No increased wob Cardio: regular rate and rhythm ABD soft Neurologically intact MSK LUE: Neurovascularly intact Sensation grossly intact distally-nerve block still effective Hand warm with mild swelling Incision: dressing C/D/I   Assessment: 1 Day Post-Op  S/P Procedure(s) (LRB): LEFT REVERSE SHOULDER ARTHROPLASTY (Left) by Dr. Ernesta Amble. Percell Miller on 07/30/2019  Principal Problem:   Secondary osteoarthritis of left shoulder due to rotator cuff  arthropathy Active Problems:   History of kidney stones   OSA on CPAP   Type 2 diabetes mellitus (HCC)   PTSD (post-traumatic stress disorder)   Primary osteoarthritis of shoulder   Primary osteoarthritis, status post left reverse total shoulder arthroplasty Doing well postop day 1 Tolerating diet and voiding Pain controlled  Plan: Up with therapy Incentive Spirometry Sling, Apply ice   Weight Bearing: Non Weight Bearing (NWB) LUE Dressings: Maintain Dressing.   VTE prophylaxis: SCDs, ambulation.  Chronic aspirin resumed.  Dispo: Home today after OT   Prudencio Burly III, PA-C 07/31/2019, 7:23 AM

## 2019-07-31 NOTE — Evaluation (Signed)
Occupational Therapy Evaluation Patient Details Name: Seth Lewis MRN: [id number] DOB: 10-Jan-1946 Today's Date: 07/31/2019    History of Present Illness LEFT REVERSE SHOULDER ARTHROPLASTY (Left)   Clinical Impression   OT eval and education complete.  Handout provided. Wife will A as needed    Follow Up Recommendations  Follow surgeon's recommendation for DC plan and follow-up therapies    Equipment Recommendations  None recommended by OT       Precautions / Restrictions Restrictions Weight Bearing Restrictions: Yes LUE Weight Bearing: Non weight bearing      Mobility Bed Mobility Overal bed mobility: Modified Independent                Transfers Overall transfer level: Modified independent                        ADL either performed or assessed with clinical judgement         Vision Patient Visual Report: No change from baseline              Pertinent Vitals/Pain Pain Assessment: No/denies pain     Hand Dominance Left      Communication Communication Communication: No difficulties   Cognition Arousal/Alertness: Awake/alert Behavior During Therapy: WFL for tasks assessed/performed Overall Cognitive Status: Within Functional Limits for tasks assessed                                           Shoulder Instructions Shoulder Instructions Donning/doffing shirt without moving shoulder: Minimal assistance;Patient able to independently direct caregiver Method for sponge bathing under operated UE: Minimal assistance;Patient able to independently direct caregiver Donning/doffing sling/immobilizer: Minimal assistance;Patient able to independently direct caregiver Correct positioning of sling/immobilizer: Minimal assistance;Patient able to independently direct caregiver ROM for elbow, wrist and digits of operated UE: Minimal assistance;Patient able to independently direct caregiver Sling wearing schedule (on at all  times/off for ADL's): Minimal assistance;Patient able to independently direct caregiver Proper positioning of operated UE when showering: Minimal assistance;Patient able to independently direct caregiver Positioning of UE while sleeping: Minimal assistance;Patient able to independently direct caregiver    Home Living Family/patient expects to be discharged to:: Private residence Living Arrangements: Spouse/significant other                                      Prior Functioning/Environment Level of Independence: Independent                          OT Goals(Current goals can be found in the care plan section) Acute Rehab OT Goals Patient Stated Goal: home today OT Goal Formulation: With patient  OT Frequency:      AM-PAC OT "6 Clicks" Daily Activity     Outcome Measure Help from another person eating meals?: None Help from another person taking care of personal grooming?: A Little Help from another person toileting, which includes using toliet, bedpan, or urinal?: A Little Help from another person bathing (including washing, rinsing, drying)?: A Little Help from another person to put on and taking off regular upper body clothing?: A Little Help from another person to put on and taking off regular lower body clothing?: A Little 6 Click Score: 19   End of Session Equipment Utilized During Treatment:  Other (comment)(sling) Nurse Communication: Mobility status  Activity Tolerance: Patient tolerated treatment well Patient left: in chair;with nursing/sitter in room                   Time: 1000-1025 OT Time Calculation (min): 25 min Charges:  OT General Charges $OT Visit: 1 Visit OT Evaluation $OT Eval Low Complexity: 1 Low OT Treatments $Self Care/Home Management : 8-22 mins  Lise Auer, OT Acute Rehabilitation Services [phone] Office- [phone]     Talulah Schirmer, Karin Golden D 07/31/2019, 11:43 AM

## 2019-07-31 NOTE — H&P (View-Only) (Signed)
    Subjective: Patient reports pain as mild to moderate, controlled.  Nerve block still working.  Tolerating diet.  Urinating.  No CP, SOB.    Objective:   VITALS:   Vitals:   07/30/19 1920 07/30/19 2253 07/31/19 0142 07/31/19 0457  BP: 120/69 140/83 105/71 97/85  Pulse: 62 64 (!) 58 (!) 59  Resp: 16 14 16 15  Temp: 97.9 F (36.6 C) 98 F (36.7 C) 97.9 F (36.6 C) 98.3 F (36.8 C)  TempSrc: Oral Oral Oral Oral  SpO2: 93% 97% 98% 96%  Weight:      Height:       CBC Latest Ref Rng & Units 07/22/2019 12/21/2016 03/30/2016  WBC 4.0 - 10.5 K/uL 6.7 6.5 -  Hemoglobin 13.0 - 17.0 g/dL 13.1 13.8 14.6  Hematocrit 39.0 - 52.0 % 42.6 42.6 43.0  Platelets 150 - 400 K/uL 269 249 -   BMP Latest Ref Rng & Units 07/22/2019 12/21/2016 03/30/2016  Glucose 70 - 99 mg/dL 131(H) 210(H) 176(H)  BUN 8 - 23 mg/dL 14 10 11  Creatinine 0.61 - 1.24 mg/dL 1.18 1.12 1.10  Sodium 135 - 145 mmol/L 140 141 137  Potassium 3.5 - 5.1 mmol/L 5.1 4.0 3.9  Chloride 98 - 111 mmol/L 103 102 99(L)  CO2 22 - 32 mmol/L 28 28 -  Calcium 8.9 - 10.3 mg/dL 9.2 9.1 -   Intake/Output      11/10 0701 - 11/11 0700 11/11 0701 - 11/12 0700   P.O. 360    I.V. (mL/kg) 2955.6 (28.8)    IV Piggyback 300.5    Total Intake(mL/kg) 3616.1 (35.3)    Urine (mL/kg/hr) 1125    Stool 0    Blood 150    Total Output 1275    Net +2341.1         Urine Occurrence 0 x    Stool Occurrence 1 x       Physical Exam: General: NAD.  Upright in bed.  Calm, conversant. Resp: No increased wob Cardio: regular rate and rhythm ABD soft Neurologically intact MSK LUE: Neurovascularly intact Sensation grossly intact distally-nerve block still effective Hand warm with mild swelling Incision: dressing C/D/I   Assessment: 1 Day Post-Op  S/P Procedure(s) (LRB): LEFT REVERSE SHOULDER ARTHROPLASTY (Left) by Dr. Timothy D. Murphy on 07/30/2019  Principal Problem:   Secondary osteoarthritis of left shoulder due to rotator cuff  arthropathy Active Problems:   History of kidney stones   OSA on CPAP   Type 2 diabetes mellitus (HCC)   PTSD (post-traumatic stress disorder)   Primary osteoarthritis of shoulder   Primary osteoarthritis, status post left reverse total shoulder arthroplasty Doing well postop day 1 Tolerating diet and voiding Pain controlled  Plan: Up with therapy Incentive Spirometry Sling, Apply ice   Weight Bearing: Non Weight Bearing (NWB) LUE Dressings: Maintain Dressing.   VTE prophylaxis: SCDs, ambulation.  Chronic aspirin resumed.  Dispo: Home today after OT   Mellanie Bejarano Calvin Martensen III, PA-C 07/31/2019, 7:23 AM  

## 2019-08-01 ENCOUNTER — Encounter (HOSPITAL_COMMUNITY): Payer: Self-pay | Admitting: Orthopedic Surgery

## 2019-08-16 ENCOUNTER — Other Ambulatory Visit: Payer: Self-pay

## 2019-08-16 ENCOUNTER — Encounter (HOSPITAL_COMMUNITY): Payer: Self-pay | Admitting: *Deleted

## 2019-08-16 ENCOUNTER — Other Ambulatory Visit (HOSPITAL_COMMUNITY)
Admission: RE | Admit: 2019-08-16 | Discharge: 2019-08-16 | Disposition: A | Discharge status: Home / Self Care | Payer: Medicare Other | Source: Ambulatory Visit | Attending: Orthopedic Surgery | Admitting: Orthopedic Surgery

## 2019-08-16 DIAGNOSIS — Z01812 Encounter for preprocedural laboratory examination: Secondary | ICD-10-CM | POA: Insufficient documentation

## 2019-08-16 DIAGNOSIS — Z20828 Contact with and (suspected) exposure to other viral communicable diseases: Secondary | ICD-10-CM | POA: Insufficient documentation

## 2019-08-16 LAB — SARS CORONAVIRUS 2 (TAT 6-24 HRS): SARS Coronavirus 2: NEGATIVE

## 2019-08-16 NOTE — Progress Notes (Signed)
SDW call complete. No solid food after midnight the night before surgery. Clear liquids until 0415am the morning of surgery. Instructed patient to drink G-2 as ordered. Verbalized understanding of all instructions.

## 2019-08-18 NOTE — Anesthesia Preprocedure Evaluation (Addendum)
Anesthesia Evaluation  Patient identified by MRN, date of birth, ID band Patient awake    Reviewed: Allergy & Precautions, NPO status , Patient's Chart, lab work & pertinent test results, reviewed documented beta blocker date and time   Airway Mallampati: III       Dental no notable dental hx. (+) Teeth Intact, Dental Advisory Given   Pulmonary sleep apnea and Continuous Positive Airway Pressure Ventilation , former smoker,    Pulmonary exam normal breath sounds clear to auscultation       Cardiovascular hypertension, Pt. on home beta blockers and Pt. on medications Normal cardiovascular exam+ dysrhythmias  Rhythm:Regular Rate:Normal  EKG w/ 1st degree AVB   Neuro/Psych PSYCHIATRIC DISORDERS Anxiety Upper extremity radiculopathy negative neurological ROS     GI/Hepatic negative GI ROS, Neg liver ROS,   Endo/Other  diabetes, Type 2, Oral Hypoglycemic Agents  Renal/GU Hx nephrolithiasis- 42 stones in the past  negative genitourinary   Musculoskeletal  (+) Arthritis , Osteoarthritis,  Left shoulder prosthetic dislocation   Abdominal Normal abdominal exam  (+)   Peds  Hematology negative hematology ROS (+)   Anesthesia Other Findings HLD  Reproductive/Obstetrics negative OB ROS                            Anesthesia Physical  Anesthesia Plan  ASA: III  Anesthesia Plan: General   Post-op Pain Management: GA combined w/ Regional for post-op pain   Induction: Intravenous  PONV Risk Score and Plan: 2 and Ondansetron, Midazolam, Treatment may vary due to age or medical condition and Dexamethasone  Airway Management Planned: Oral ETT and Video Laryngoscope Planned  Additional Equipment: None  Intra-op Plan:   Post-operative Plan: Extubation in OR  Informed Consent: I have reviewed the patients History and Physical, chart, labs and discussed the procedure including the risks, benefits and  alternatives for the proposed anesthesia with the patient or authorized representative who has indicated his/her understanding and acceptance.     Dental advisory given  Plan Discussed with: CRNA  Anesthesia Plan Comments: (Last airway note: "MAC 4 glidescope with noted GRADE 1 view with noted anterior larynx placement, required steep anterior guidance to pass tube.")      Anesthesia Quick Evaluation

## 2019-08-19 ENCOUNTER — Inpatient Hospital Stay (HOSPITAL_COMMUNITY): Payer: Medicare Other | Admitting: Certified Registered Nurse Anesthetist

## 2019-08-19 ENCOUNTER — Encounter (HOSPITAL_COMMUNITY)
Admission: RE | Disposition: A | Discharge status: Home / Self Care | Payer: Self-pay | Source: Home / Self Care | Attending: Orthopedic Surgery

## 2019-08-19 ENCOUNTER — Other Ambulatory Visit: Payer: Self-pay

## 2019-08-19 ENCOUNTER — Inpatient Hospital Stay (HOSPITAL_COMMUNITY): Payer: Medicare Other

## 2019-08-19 ENCOUNTER — Encounter (HOSPITAL_COMMUNITY): Payer: Self-pay

## 2019-08-19 ENCOUNTER — Inpatient Hospital Stay (HOSPITAL_COMMUNITY)
Admission: RE | Admit: 2019-08-19 | Discharge: 2019-08-20 | DRG: 483 | Disposition: A | Discharge status: Home / Self Care | Payer: Medicare Other | Attending: Orthopedic Surgery | Admitting: Orthopedic Surgery

## 2019-08-19 DIAGNOSIS — M19012 Primary osteoarthritis, left shoulder: Secondary | ICD-10-CM | POA: Diagnosis present

## 2019-08-19 DIAGNOSIS — M25812 Other specified joint disorders, left shoulder: Secondary | ICD-10-CM | POA: Diagnosis present

## 2019-08-19 DIAGNOSIS — T84028A Dislocation of other internal joint prosthesis, initial encounter: Principal | ICD-10-CM | POA: Diagnosis present

## 2019-08-19 DIAGNOSIS — Z09 Encounter for follow-up examination after completed treatment for conditions other than malignant neoplasm: Secondary | ICD-10-CM

## 2019-08-19 DIAGNOSIS — Y792 Prosthetic and other implants, materials and accessory orthopedic devices associated with adverse incidents: Secondary | ICD-10-CM | POA: Diagnosis present

## 2019-08-19 DIAGNOSIS — G4733 Obstructive sleep apnea (adult) (pediatric): Secondary | ICD-10-CM | POA: Diagnosis present

## 2019-08-19 DIAGNOSIS — M19212 Secondary osteoarthritis, left shoulder: Secondary | ICD-10-CM | POA: Diagnosis present

## 2019-08-19 DIAGNOSIS — Z7982 Long term (current) use of aspirin: Secondary | ICD-10-CM

## 2019-08-19 DIAGNOSIS — Z20828 Contact with and (suspected) exposure to other viral communicable diseases: Secondary | ICD-10-CM | POA: Diagnosis present

## 2019-08-19 HISTORY — PX: REVERSE SHOULDER ARTHROPLASTY: SHX5054

## 2019-08-19 LAB — TYPE AND SCREEN
ABO/RH(D): B POS
Antibody Screen: NEGATIVE

## 2019-08-19 LAB — ABO/RH: ABO/RH(D): B POS

## 2019-08-19 LAB — CBC
HCT: 37.4 % — ABNORMAL LOW (ref 39.0–52.0)
Hemoglobin: 11.7 g/dL — ABNORMAL LOW (ref 13.0–17.0)
MCH: 30.1 pg (ref 26.0–34.0)
MCHC: 31.3 g/dL (ref 30.0–36.0)
MCV: 96.1 fL (ref 80.0–100.0)
Platelets: 417 10*3/uL — ABNORMAL HIGH (ref 150–400)
RBC: 3.89 MIL/uL — ABNORMAL LOW (ref 4.22–5.81)
RDW: 13.2 % (ref 11.5–15.5)
WBC: 8.4 10*3/uL (ref 4.0–10.5)
nRBC: 0 % (ref 0.0–0.2)

## 2019-08-19 LAB — GLUCOSE, CAPILLARY
Glucose-Capillary: 144 mg/dL — ABNORMAL HIGH (ref 70–99)
Glucose-Capillary: 138 mg/dL — ABNORMAL HIGH (ref 70–99)
Glucose-Capillary: 240 mg/dL — ABNORMAL HIGH (ref 70–99)
Glucose-Capillary: 148 mg/dL — ABNORMAL HIGH (ref 70–99)

## 2019-08-19 LAB — BASIC METABOLIC PANEL
Anion gap: 14 (ref 5–15)
BUN: 15 mg/dL (ref 8–23)
CO2: 27 mmol/L (ref 22–32)
Calcium: 9.2 mg/dL (ref 8.9–10.3)
Chloride: 97 mmol/L — ABNORMAL LOW (ref 98–111)
Creatinine, Ser: 1.34 mg/dL — ABNORMAL HIGH (ref 0.61–1.24)
GFR calc Af Amer: >60 mL/min (ref >60)
GFR calc non Af Amer: 52 mL/min — ABNORMAL LOW (ref >60)
Glucose, Bld: 146 mg/dL — ABNORMAL HIGH (ref 70–99)
Potassium: 4.1 mmol/L (ref 3.5–5.1)
Sodium: 138 mmol/L (ref 135–145)

## 2019-08-19 SURGERY — ARTHROPLASTY, SHOULDER, TOTAL, REVERSE
Anesthesia: General | Site: Shoulder | Laterality: Left

## 2019-08-19 MED ORDER — ROCURONIUM BROMIDE 10 MG/ML (PF) SYRINGE
PREFILLED_SYRINGE | INTRAVENOUS | Status: DC | PRN
  Administered 2019-08-19: 20 mg via INTRAVENOUS
  Administered 2019-08-19: 60 mg via INTRAVENOUS

## 2019-08-19 MED ORDER — BUPIVACAINE LIPOSOME 1.3 % IJ SUSP
INTRAMUSCULAR | Status: DC | PRN
  Administered 2019-08-19: 10 mL via PERINEURAL

## 2019-08-19 MED ORDER — ONDANSETRON HCL 4 MG/2ML IJ SOLN
INTRAMUSCULAR | Status: DC | PRN
  Administered 2019-08-19: 4 mg via INTRAVENOUS

## 2019-08-19 MED ORDER — METOCLOPRAMIDE HCL 5 MG PO TABS: 5.0000 mg | ORAL_TABLET | Freq: Three times a day (TID) | ORAL | Status: DC | PRN

## 2019-08-19 MED ORDER — ZOLPIDEM TARTRATE 5 MG PO TABS: 5.0000 mg | ORAL_TABLET | Freq: Every evening | ORAL | Status: DC | PRN

## 2019-08-19 MED ORDER — ACETAMINOPHEN 500 MG PO TABS
1000.0000 mg | ORAL_TABLET | Freq: Once | ORAL | Status: AC
  Administered 2019-08-19: 1000 mg via ORAL
  Filled 2019-08-19: qty 2

## 2019-08-19 MED ORDER — FENTANYL CITRATE (PF) 250 MCG/5ML IJ SOLN
INTRAMUSCULAR | Status: DC | PRN
  Administered 2019-08-19 (×2): 50 ug via INTRAVENOUS

## 2019-08-19 MED ORDER — CEFAZOLIN SODIUM-DEXTROSE 1-4 GM/50ML-% IV SOLN
1.0000 g | Freq: Four times a day (QID) | INTRAVENOUS | Status: AC
  Administered 2019-08-19 – 2019-08-20 (×3): 1 g via INTRAVENOUS
  Filled 2019-08-19 (×3): qty 50

## 2019-08-19 MED ORDER — OXYCODONE HCL 5 MG PO TABS
5.0000 mg | ORAL_TABLET | ORAL | Status: DC | PRN
  Administered 2019-08-19 – 2019-08-20 (×3): 10 mg via ORAL
  Filled 2019-08-19 (×4): qty 2

## 2019-08-19 MED ORDER — ONDANSETRON HCL 4 MG/2ML IJ SOLN: 4.0000 mg | Freq: Four times a day (QID) | INTRAMUSCULAR | Status: DC | PRN

## 2019-08-19 MED ORDER — CELECOXIB 200 MG PO CAPS
200.0000 mg | ORAL_CAPSULE | Freq: Two times a day (BID) | ORAL | Status: DC
  Administered 2019-08-19 – 2019-08-20 (×3): 200 mg via ORAL
  Filled 2019-08-19 (×3): qty 1

## 2019-08-19 MED ORDER — PHENYLEPHRINE HCL-NACL 10-0.9 MG/250ML-% IV SOLN
INTRAVENOUS | Status: DC | PRN
  Administered 2019-08-19: 30 ug/min via INTRAVENOUS

## 2019-08-19 MED ORDER — NIFEDIPINE ER OSMOTIC RELEASE 60 MG PO TB24
60.0000 mg | ORAL_TABLET | Freq: Every evening | ORAL | Status: DC
  Administered 2019-08-19: 60 mg via ORAL
  Filled 2019-08-19 (×2): qty 1

## 2019-08-19 MED ORDER — DEXAMETHASONE SODIUM PHOSPHATE 10 MG/ML IJ SOLN
INTRAMUSCULAR | Status: DC | PRN
  Administered 2019-08-19: 5 mg via INTRAVENOUS

## 2019-08-19 MED ORDER — CHLORHEXIDINE GLUCONATE 4 % EX LIQD: 60.0000 mL | Freq: Once | CUTANEOUS | Status: DC

## 2019-08-19 MED ORDER — GLYCOPYRROLATE 0.2 MG/ML IJ SOLN
INTRAMUSCULAR | Status: DC | PRN
  Administered 2019-08-19: 0.1 mg via INTRAVENOUS

## 2019-08-19 MED ORDER — DOCUSATE SODIUM 100 MG PO CAPS
100.0000 mg | ORAL_CAPSULE | Freq: Two times a day (BID) | ORAL | Status: DC
  Administered 2019-08-19 – 2019-08-20 (×2): 100 mg via ORAL
  Filled 2019-08-19 (×2): qty 1

## 2019-08-19 MED ORDER — ATORVASTATIN CALCIUM 40 MG PO TABS: 40.0000 mg | ORAL_TABLET | Freq: Every day | ORAL | Status: DC

## 2019-08-19 MED ORDER — PRAZOSIN HCL 2 MG PO CAPS
2.0000 mg | ORAL_CAPSULE | Freq: Every day | ORAL | Status: DC
  Filled 2019-08-19: qty 1

## 2019-08-19 MED ORDER — METOCLOPRAMIDE HCL 5 MG/ML IJ SOLN: 5.0000 mg | Freq: Three times a day (TID) | INTRAMUSCULAR | Status: DC | PRN

## 2019-08-19 MED ORDER — BISACODYL 10 MG RE SUPP: 10.0000 mg | Freq: Every day | RECTAL | Status: DC | PRN

## 2019-08-19 MED ORDER — BUPIVACAINE-EPINEPHRINE 0.5% -1:200000 IJ SOLN
INTRAMUSCULAR | Status: AC
  Filled 2019-08-19: qty 1

## 2019-08-19 MED ORDER — TRANEXAMIC ACID-NACL 1000-0.7 MG/100ML-% IV SOLN
1000.0000 mg | INTRAVENOUS | Status: AC
  Administered 2019-08-19: 1000 mg via INTRAVENOUS
  Filled 2019-08-19: qty 100

## 2019-08-19 MED ORDER — ACETAMINOPHEN 500 MG PO TABS
1000.0000 mg | ORAL_TABLET | Freq: Three times a day (TID) | ORAL | Status: DC
  Administered 2019-08-19 – 2019-08-20 (×3): 1000 mg via ORAL
  Filled 2019-08-19 (×3): qty 2

## 2019-08-19 MED ORDER — POLYETHYLENE GLYCOL 3350 17 G PO PACK: 17.0000 g | PACK | Freq: Every day | ORAL | Status: DC | PRN

## 2019-08-19 MED ORDER — OXYCODONE HCL 5 MG/5ML PO SOLN: 5.0000 mg | Freq: Once | ORAL | Status: DC | PRN

## 2019-08-19 MED ORDER — 0.9 % SODIUM CHLORIDE (POUR BTL) OPTIME
TOPICAL | Status: DC | PRN
  Administered 2019-08-19: 1000 mL

## 2019-08-19 MED ORDER — ONDANSETRON HCL 4 MG/2ML IJ SOLN: 4.0000 mg | Freq: Once | INTRAMUSCULAR | Status: DC | PRN

## 2019-08-19 MED ORDER — PROPOFOL 10 MG/ML IV BOLUS
INTRAVENOUS | Status: DC | PRN
  Administered 2019-08-19: 150 mg via INTRAVENOUS

## 2019-08-19 MED ORDER — PHENOL 1.4 % MT LIQD: 1.0000 | OROMUCOSAL | Status: DC | PRN

## 2019-08-19 MED ORDER — METFORMIN HCL ER 500 MG PO TB24
500.0000 mg | ORAL_TABLET | Freq: Two times a day (BID) | ORAL | Status: DC
  Administered 2019-08-19 – 2019-08-20 (×2): 500 mg via ORAL
  Filled 2019-08-19 (×3): qty 1

## 2019-08-19 MED ORDER — ROPIVACAINE HCL 5 MG/ML IJ SOLN
INTRAMUSCULAR | Status: DC | PRN
  Administered 2019-08-19: 20 mL via PERINEURAL

## 2019-08-19 MED ORDER — DIPHENHYDRAMINE HCL 12.5 MG/5ML PO ELIX
12.5000 mg | ORAL_SOLUTION | ORAL | Status: DC | PRN
  Filled 2019-08-19: qty 10

## 2019-08-19 MED ORDER — MIDAZOLAM HCL 2 MG/2ML IJ SOLN
INTRAMUSCULAR | Status: DC | PRN
  Administered 2019-08-19 (×2): 1 mg via INTRAVENOUS

## 2019-08-19 MED ORDER — SUGAMMADEX SODIUM 200 MG/2ML IV SOLN
INTRAVENOUS | Status: DC | PRN
  Administered 2019-08-19: 100 mg via INTRAVENOUS
  Administered 2019-08-19: 200 mg via INTRAVENOUS

## 2019-08-19 MED ORDER — METOPROLOL TARTRATE 25 MG PO TABS
50.0000 mg | ORAL_TABLET | Freq: Two times a day (BID) | ORAL | Status: DC
  Administered 2019-08-19 – 2019-08-20 (×2): 50 mg via ORAL
  Filled 2019-08-19 (×2): qty 2

## 2019-08-19 MED ORDER — POVIDONE-IODINE 10 % EX SWAB
2.0000 "application " | Freq: Once | CUTANEOUS | Status: AC
  Administered 2019-08-19: 2 via TOPICAL

## 2019-08-19 MED ORDER — MIDAZOLAM HCL 2 MG/2ML IJ SOLN
INTRAMUSCULAR | Status: AC
  Filled 2019-08-19: qty 2

## 2019-08-19 MED ORDER — FENTANYL CITRATE (PF) 250 MCG/5ML IJ SOLN
INTRAMUSCULAR | Status: AC
  Filled 2019-08-19: qty 5

## 2019-08-19 MED ORDER — LIDOCAINE 2% (20 MG/ML) 5 ML SYRINGE
INTRAMUSCULAR | Status: DC | PRN
  Administered 2019-08-19: 40 mg via INTRAVENOUS

## 2019-08-19 MED ORDER — LACTATED RINGERS IV SOLN
INTRAVENOUS | Status: DC
  Administered 2019-08-19: 07:00:00 via INTRAVENOUS

## 2019-08-19 MED ORDER — OXYCODONE HCL 5 MG PO TABS: 5.0000 mg | ORAL_TABLET | Freq: Once | ORAL | Status: DC | PRN

## 2019-08-19 MED ORDER — HYDROMORPHONE HCL 1 MG/ML IJ SOLN
0.5000 mg | INTRAMUSCULAR | Status: DC | PRN
  Administered 2019-08-20 (×2): 1 mg via INTRAVENOUS
  Filled 2019-08-19 (×2): qty 1

## 2019-08-19 MED ORDER — CEFAZOLIN SODIUM-DEXTROSE 2-4 GM/100ML-% IV SOLN
2.0000 g | INTRAVENOUS | Status: AC
  Administered 2019-08-19: 2 g via INTRAVENOUS
  Filled 2019-08-19: qty 100

## 2019-08-19 MED ORDER — PROPOFOL 10 MG/ML IV BOLUS
INTRAVENOUS | Status: AC
  Filled 2019-08-19: qty 20

## 2019-08-19 MED ORDER — VENLAFAXINE HCL ER 75 MG PO CP24: 75.0000 mg | ORAL_CAPSULE | Freq: Every day | ORAL | Status: DC

## 2019-08-19 MED ORDER — MENTHOL 3 MG MT LOZG: 1.0000 | LOZENGE | OROMUCOSAL | Status: DC | PRN

## 2019-08-19 MED ORDER — MAGNESIUM CITRATE PO SOLN: 1.0000 | Freq: Once | ORAL | Status: DC | PRN

## 2019-08-19 MED ORDER — HYDROMORPHONE HCL 1 MG/ML IJ SOLN: 0.2500 mg | INTRAMUSCULAR | Status: DC | PRN

## 2019-08-19 MED ORDER — ONDANSETRON HCL 4 MG PO TABS: 4.0000 mg | ORAL_TABLET | Freq: Four times a day (QID) | ORAL | Status: DC | PRN

## 2019-08-19 MED ORDER — EPHEDRINE SULFATE-NACL 50-0.9 MG/10ML-% IV SOSY
PREFILLED_SYRINGE | INTRAVENOUS | Status: DC | PRN
  Administered 2019-08-19 (×4): 5 mg via INTRAVENOUS

## 2019-08-19 SURGICAL SUPPLY — 59 items
ADAPTER MINI TAPER W/B/PLAT 25 (Miscellaneous) ×1 IMPLANT
ADPR HD STD TPR 25 HUM TI (Miscellaneous) ×1 IMPLANT
APL PRP STRL LF DISP 70% ISPRP (MISCELLANEOUS) ×1
APL SKNCLS STERI-STRIP NONHPOA (GAUZE/BANDAGES/DRESSINGS) ×1
BEARING HUMERAL 40 STD VITE (Joint) ×1 IMPLANT
BENZOIN TINCTURE PRP APPL 2/3 (GAUZE/BANDAGES/DRESSINGS) ×1 IMPLANT
BLADE SAW SAG 73X25 THK (BLADE) ×1
BLADE SAW SGTL 73X25 THK (BLADE) ×1 IMPLANT
BRNG HUM STD 40 RVRS SHLDR (Joint) ×1 IMPLANT
CHLORAPREP W/TINT 26 (MISCELLANEOUS) ×2 IMPLANT
CLSR STERI-STRIP ANTIMIC 1/2X4 (GAUZE/BANDAGES/DRESSINGS) ×1 IMPLANT
COVER SURGICAL LIGHT HANDLE (MISCELLANEOUS) ×2 IMPLANT
COVER WAND RF STERILE (DRAPES) ×2 IMPLANT
DRAPE U-SHAPE 47X51 STRL (DRAPES) IMPLANT
DRSG ADAPTIC 3X8 NADH LF (GAUZE/BANDAGES/DRESSINGS) ×2 IMPLANT
DRSG MEPILEX BORDER 4X8 (GAUZE/BANDAGES/DRESSINGS) ×1 IMPLANT
DRSG PAD ABDOMINAL 8X10 ST (GAUZE/BANDAGES/DRESSINGS) ×4 IMPLANT
ELECT REM PT RETURN 9FT ADLT (ELECTROSURGICAL) ×2
ELECTRODE REM PT RTRN 9FT ADLT (ELECTROSURGICAL) ×1 IMPLANT
GAUZE SPONGE 4X4 12PLY STRL (GAUZE/BANDAGES/DRESSINGS) IMPLANT
GLOVE BIO SURGEON STRL SZ7.5 (GLOVE) ×6 IMPLANT
GLOVE BIOGEL PI IND STRL 8 (GLOVE) ×2 IMPLANT
GLOVE BIOGEL PI INDICATOR 8 (GLOVE) ×2
GLOVE ECLIPSE 8.0 STRL XLNG CF (GLOVE) ×1 IMPLANT
GOWN STRL REUS W/ TWL LRG LVL3 (GOWN DISPOSABLE) ×2 IMPLANT
GOWN STRL REUS W/ TWL XL LVL3 (GOWN DISPOSABLE) ×1 IMPLANT
GOWN STRL REUS W/TWL LRG LVL3 (GOWN DISPOSABLE) ×6
GOWN STRL REUS W/TWL XL LVL3 (GOWN DISPOSABLE) ×2
KIT BASIN OR (CUSTOM PROCEDURE TRAY) ×2 IMPLANT
KIT TURNOVER KIT B (KITS) ×2 IMPLANT
MANIFOLD NEPTUNE II (INSTRUMENTS) ×2 IMPLANT
NDL HYPO 25GX1X1/2 BEV (NEEDLE) IMPLANT
NDL SUT .5 MAYO 1.404X.05X (NEEDLE) IMPLANT
NEEDLE HYPO 25GX1X1/2 BEV (NEEDLE) IMPLANT
NEEDLE MAYO TAPER (NEEDLE)
NS IRRIG 1000ML POUR BTL (IV SOLUTION) ×2 IMPLANT
PACK SHOULDER (CUSTOM PROCEDURE TRAY) ×2 IMPLANT
PAD ARMBOARD 7.5X6 YLW CONV (MISCELLANEOUS) ×4 IMPLANT
SLING ARM FOAM STRAP LRG (SOFTGOODS) ×1 IMPLANT
SLING ARM IMMOBILIZER LRG (SOFTGOODS) ×2 IMPLANT
SLING ARM IMMOBILIZER MED (SOFTGOODS) IMPLANT
SPONGE LAP 18X18 RF (DISPOSABLE) ×2 IMPLANT
STRIP CLOSURE SKIN 1/2X4 (GAUZE/BANDAGES/DRESSINGS) ×2 IMPLANT
SUCTION FRAZIER HANDLE 10FR (MISCELLANEOUS)
SUCTION TUBE FRAZIER 10FR DISP (MISCELLANEOUS) IMPLANT
SUPPORT WRAP ARM LG (MISCELLANEOUS) IMPLANT
SUT FIBERWIRE #2 38 T-5 BLUE (SUTURE) ×4
SUT MNCRL AB 4-0 PS2 18 (SUTURE) ×3 IMPLANT
SUT MON AB 2-0 CT1 36 (SUTURE) ×2 IMPLANT
SUT VIC AB 0 CT1 27 (SUTURE) ×2
SUT VIC AB 0 CT1 27XBRD ANBCTR (SUTURE) ×1 IMPLANT
SUT VIC AB 3-0 CT1 27 (SUTURE) ×4
SUT VIC AB 3-0 CT1 TAPERPNT 27 (SUTURE) IMPLANT
SUTURE FIBERWR #2 38 T-5 BLUE (SUTURE) ×2 IMPLANT
TOWEL GREEN STERILE (TOWEL DISPOSABLE) ×2 IMPLANT
TOWEL GREEN STERILE FF (TOWEL DISPOSABLE) ×2 IMPLANT
TRAY FOLEY W/BAG SLVR 14FR (SET/KITS/TRAYS/PACK) IMPLANT
TRAY HUM MINI +0 40D +5 (Shoulder) ×1 IMPLANT
WATER STERILE IRR 1000ML POUR (IV SOLUTION) ×2 IMPLANT

## 2019-08-19 NOTE — Interval H&P Note (Signed)
I participated in the care of this patient and agree with the above history, physical and evaluation. I performed a review of the history and a physical exam as detailed   He suffered a dislodging of his Glenosphere. Plan to replace this today   Carole Binning MD

## 2019-08-19 NOTE — Anesthesia Procedure Notes (Signed)
Anesthesia Regional Block: Interscalene brachial plexus block   Pre-Anesthetic Checklist: ,, timeout performed, Correct Patient, Correct Site, Correct Laterality, Correct Procedure, Correct Position, site marked, Risks and benefits discussed,  Surgical consent,  Pre-op evaluation,  At surgeon's request and post-op pain management  Laterality: Left  Prep: Maximum Sterile Barrier Precautions used, chloraprep       Needles:  Injection technique: Single-shot  Needle Type: Echogenic Stimulator Needle     Needle Length: 9cm  Needle Gauge: 22     Additional Needles:   Procedures:,,,, ultrasound used (permanent image in chart),,,,  Narrative:  Start time: 08/19/2019 6:50 AM End time: 08/19/2019 7:00 AM Injection made incrementally with aspirations every 5 mL.  Performed by: Personally  Anesthesiologist: Pervis Hocking, DO  Additional Notes: Monitors applied. No increased pain on injection. No increased resistance to injection. Injection made in 5cc increments. Good needle visualization. Patient tolerated procedure well.

## 2019-08-19 NOTE — Anesthesia Postprocedure Evaluation (Signed)
Anesthesia Post Note  Patient: Seth Lewis  Procedure(s) Performed: Revision LEFT REVERSE SHOULDER ARTHROPLASTY (Left Shoulder)     Patient location during evaluation: PACU Anesthesia Type: General and Regional Level of consciousness: awake and alert, oriented and patient cooperative Pain management: pain level controlled Vital Signs Assessment: post-procedure vital signs reviewed and stable Respiratory status: spontaneous breathing, nonlabored ventilation and respiratory function stable Cardiovascular status: blood pressure returned to baseline and stable Postop Assessment: no apparent nausea or vomiting Anesthetic complications: no    Last Vitals:  Vitals:   08/19/19 0924 08/19/19 0940  BP: 130/74 124/63  Pulse: (!) 54 (!) 56  Resp: 16 16  Temp:    SpO2: 95% 96%    Last Pain:  Vitals:   08/19/19 0940  TempSrc:   PainSc: [name]

## 2019-08-19 NOTE — Transfer of Care (Signed)
Immediate Anesthesia Transfer of Care Note  Patient: Seth Lewis  Procedure(s) Performed: Revision LEFT REVERSE SHOULDER ARTHROPLASTY (Left Shoulder)  Patient Location: PACU  Anesthesia Type:General and GA combined with regional for post-op pain  Level of Consciousness: drowsy and patient cooperative  Airway & Oxygen Therapy: Patient Spontanous Breathing  Post-op Assessment: Report given to RN, Post -op Vital signs reviewed and stable and Patient moving all extremities X 4  Post vital signs: Reviewed and stable  Last Vitals:  Vitals Value Taken Time  BP 136/67 08/19/19 0908  Temp    Pulse 62 08/19/19 0909  Resp 18 08/19/19 0909  SpO2 95 % 08/19/19 0909  Vitals shown include unvalidated device data.  Last Pain:  Vitals:   08/19/19 0630  TempSrc: Oral  PainSc: 10-Worst pain ever      Patients Stated Pain Goal: 3 (17/71/16 5790)  Complications: No apparent anesthesia complications

## 2019-08-19 NOTE — Progress Notes (Signed)
Orthopedic Tech Progress Note Patient Details:  Seth Lewis 1946/07/06 [id number] RN called for an XL shoulder immobilizer for patient. The one he had on was a little to short for his arm Ortho Devices Type of Ortho Device: Shoulder immobilizer Ortho Device/Splint Location: LUE Ortho Device/Splint Interventions: Adjustment, Application, Ordered   Post Interventions Patient Tolerated: Well Instructions Provided: Poper ambulation with device, Care of device, Adjustment of device   [name] 08/19/2019, 11:19 AM

## 2019-08-19 NOTE — Anesthesia Procedure Notes (Signed)
Procedure Name: Intubation Date/Time: 08/19/2019 7:30 AM Performed by: Larene Beach, CRNA Pre-anesthesia Checklist: Patient identified, Emergency Drugs available, Suction available and Patient being monitored Patient Re-evaluated:Patient Re-evaluated prior to induction Oxygen Delivery Method: Circle system utilized Preoxygenation: Pre-oxygenation with 100% oxygen Induction Type: IV induction Ventilation: Mask ventilation without difficulty Laryngoscope Size: Glidescope and 4 Grade View: Grade I Tube type: Oral Tube size: 7.5 mm Number of attempts: 1 Airway Equipment and Method: Oral airway,  Video-laryngoscopy and Rigid stylet Placement Confirmation: ETT inserted through vocal cords under direct vision,  positive ETCO2 and breath sounds checked- equal and bilateral Secured at: 23 cm Tube secured with: Tape Dental Injury: Teeth and Oropharynx as per pre-operative assessment  Difficulty Due To: Difficulty was anticipated and Difficult Airway- due to anterior larynx

## 2019-08-20 LAB — GLUCOSE, CAPILLARY: Glucose-Capillary: 120 mg/dL — ABNORMAL HIGH (ref 70–99)

## 2019-08-20 MED ORDER — OXYCODONE HCL 5 MG PO TABS: 5.0000 mg | ORAL_TABLET | ORAL | 0 refills | Status: AC | PRN

## 2019-08-20 MED ORDER — DOCUSATE SODIUM 100 MG PO CAPS: 100.0000 mg | ORAL_CAPSULE | Freq: Two times a day (BID) | ORAL | 0 refills | Status: DC

## 2019-08-20 MED ORDER — GABAPENTIN 100 MG PO CAPS: 100.0000 mg | ORAL_CAPSULE | Freq: Two times a day (BID) | ORAL | 0 refills | Status: DC

## 2019-08-20 MED ORDER — ACETAMINOPHEN 500 MG PO TABS: 1000.0000 mg | ORAL_TABLET | Freq: Three times a day (TID) | ORAL | 0 refills | Status: AC

## 2019-08-20 MED ORDER — OMEPRAZOLE 20 MG PO CPDR: 20.0000 mg | DELAYED_RELEASE_CAPSULE | Freq: Every day | ORAL | 0 refills | Status: DC

## 2019-08-20 MED ORDER — CELECOXIB 200 MG PO CAPS: 200.0000 mg | ORAL_CAPSULE | Freq: Two times a day (BID) | ORAL | 0 refills | Status: AC

## 2019-08-20 NOTE — Op Note (Signed)
08/19/2019  7:35 AM  PATIENT:  Seth Lewis    PRE-OPERATIVE DIAGNOSIS:  LEFT SHOULDER PERIPROSTHETIC DISLOCATION  POST-OPERATIVE DIAGNOSIS:  Same  PROCEDURE:  Revision LEFT REVERSE SHOULDER ARTHROPLASTY  SURGEON:  Renette Butters, MD  ASSISTANT: Noemi Chapel, PA-C, he was present and scrubbed throughout the case, critical for completion in a timely fashion, and for retraction, instrumentation, and closure.   ANESTHESIA:   gen  PREOPERATIVE INDICATIONS:  [name] is a  73 y.o. male with a diagnosis of LEFT SHOULDER PERIPROSTHETIC DISLOCATION who failed conservative measures and elected for surgical management.    The risks benefits and alternatives were discussed with the patient preoperatively including but not limited to the risks of infection, bleeding, nerve injury, cardiopulmonary complications, the need for revision surgery, among others, and the patient was willing to proceed.  OPERATIVE IMPLANTS: Biomet exchange Glenosphere base and increased size of Humeral tray to +5  OPERATIVE FINDINGS: disengaged glenosphere and inferior bone with fibrous tissue blocking this  BLOOD LOSS: min  COMPLICATIONS: none  TOURNIQUET TIME: none  OPERATIVE PROCEDURE:  Patient was identified in the preoperative holding area and site was marked by me He was transported to the operating theater and placed on the table in supine position taking care to pad all bony prominences. After a preincinduction time out anesthesia was induced. The left upper extremity was prepped and draped in normal sterile fashion and a pre-incision timeout was performed. He received ancef for preoperative antibiotics.   He was placed in the beachchair position again padding all bony prominences in his left shoulder was prepped and draped.  I made a deltopectoral incision through his previous incision after removing subcu closure I was able to bluntly dissect his deltopectoral interval.  I removed the  humeral tray and placed this on the back table there was some polywear from articulation superiorly so I did remove this and with further later replace it.  Glenosphere was disengaged and superiorly migrated I was able to easily remove this I removed the trunnion and replaced this with a new one in the B position for inferior offset.  Had examined his glenoid sphere baseplate this was very stable there was some inferior scar tissue fibrous tissue and bony blocking full seating so I removed this.  I then replaced the glenosphere to be positioned inferiorly this was confirmed with x-ray to be fully seated with fluoroscopy.  I then trialed with his previous baseplate set up this was slightly looser so I did elect to place increased size glenosphere tray to a +5.  I trialed with these and was happy with the fit I and implant impacted a new poly into this baseplate tray reduced it and was very happy with the tension on the shoulder and the reduction.  I then thoroughly irrigated and closed the skin in layers.  Sterile dressing was applied he was awoken taken to PACU in stable condition  POST OPERATIVE PLAN: SLing full time

## 2019-08-20 NOTE — Discharge Instructions (Signed)
Keep sling on at all times.  Do not bear weight with arm. °Keep your dressing on and dry until follow up. °Take pain medicine as needed with the goal of transitioning to over the counter medicines.  ° ° °INSTRUCTIONS AFTER JOINT REPLACEMENT  ° °o Remove items at home which could result in a fall. This includes throw rugs or furniture in walking pathways °o ICE to the affected joint every three hours while awake for 30 minutes at a time, for at least the first 3-5 days, and then as needed for pain and swelling.  Continue to use ice for pain and swelling. You may notice swelling that will progress down to the foot and ankle.  This is normal after surgery.  Elevate your leg when you are not up walking on it.   °o Continue to use the breathing machine you got in the hospital (incentive spirometer) which will help keep your temperature down.  It is common for your temperature to cycle up and down following surgery, especially at night when you are not up moving around and exerting yourself.  The breathing machine keeps your lungs expanded and your temperature down. ° ° °DIET:  As you were doing prior to hospitalization, we recommend a well-balanced diet. ° °DRESSING / WOUND CARE / SHOWERING ° °Keep dressing dry.  You may use an occlusive plastic wrap (Press'n Seal for example) with blue painter's tape at edges, NO SOAKING/SUBMERGING IN THE BATHTUB.  If the bandage gets wet, change with a clean dry gauze.  If the incision gets wet, pat the wound dry with a clean towel. ° °ACTIVITY ° °o Increase activity slowly as tolerated, but follow the weight bearing instructions below.   °o No driving for 6 weeks or until further direction given by your physician.  You cannot drive while taking narcotics.  °o No lifting or carrying greater than 10 lbs. until further directed by your surgeon. °o Avoid periods of inactivity such as sitting longer than an hour when not asleep. This helps prevent blood clots.  °o You may return to work  once you are authorized by your doctor.  ° ° ° °WEIGHT BEARING  °Do not bear weight with arm.  Maintain sling at all times. ° °A rehabilitation program following joint replacement surgery can speed recovery and prevent re-injury in the future due to weakened muscles. Contact your doctor or a physical therapist for more information on knee rehabilitation.  ° ° °CONSTIPATION ° °Constipation is defined medically as fewer than three stools per week and severe constipation as less than one stool per week.  Even if you have a regular bowel pattern at home, your normal regimen is likely to be disrupted due to multiple reasons following surgery.  Combination of anesthesia, postoperative narcotics, change in appetite and fluid intake all can affect your bowels.  ° °YOU MUST use at least one of the following options; they are listed in order of increasing strength to get the job done.  They are all available over the counter, and you may need to use some, POSSIBLY even all of these options:   ° °Drink plenty of fluids (prune juice may be helpful) and high fiber foods °Colace 100 mg by mouth twice a day  °Senokot for constipation as directed and as needed Dulcolax (bisacodyl), take with full glass of water  °Miralax (polyethylene glycol) once or twice a day as needed. ° °If you have tried all these things and are unable to have a bowel   movement in the first 3-4 days after surgery call either your surgeon or your primary doctor.   ° °If you experience loose stools or diarrhea, hold the medications until you stool forms back up.  If your symptoms do not get better within 1 week or if they get worse, check with your doctor.  If you experience "the worst abdominal pain ever" or develop nausea or vomiting, please contact the office immediately for further recommendations for treatment. ° ° °ITCHING:  If you experience itching with your medications, try taking only a single pain pill, or even half a pain pill at a time.  You can  also use Benadryl over the counter for itching or also to help with sleep.  ° °TED HOSE STOCKINGS:  Use stockings on both legs until for at least 2 weeks or as directed by physician office. They may be removed at night for sleeping. ° °MEDICATIONS:  See your medication summary on the “After Visit Summary” that nursing will review with you.  You may have some home medications which will be placed on hold until you complete the course of blood thinner medication.  It is important for you to complete the blood thinner medication as prescribed. ° °PRECAUTIONS:  If you experience chest pain or shortness of breath - call 911 immediately for transfer to the hospital emergency department.  ° °If you develop a fever greater that 101 F, purulent drainage from wound, increased redness or drainage from wound, foul odor from the wound/dressing, or calf pain - CONTACT YOUR SURGEON.   °                                                °FOLLOW-UP APPOINTMENTS:  If you do not already have a post-op appointment, please call the office for an appointment to be seen by your surgeon.  Guidelines for how soon to be seen are listed in your “After Visit Summary”, but are typically between 1-4 weeks after surgery. ° °OTHER INSTRUCTIONS:  ° ° ° °MAKE SURE YOU:  °• Understand these instructions.  °• Get help right away if you are not doing well or get worse.  ° ° °Thank you for letting us be a part of your medical care team.  It is a privilege we respect greatly.  We hope these instructions will help you stay on track for a fast and full recovery!  ° ° ° ° ° ° °

## 2019-08-20 NOTE — Progress Notes (Signed)
    Subjective: Patient reports pain as mild.  Tolerating diet.     Mobilizing frequently.  Objective:   VITALS:   Vitals:   08/19/19 1912 08/19/19 2303 08/19/19 2303 08/20/19 0439  BP: 132/74 131/74 131/74 (!) 107/91  Pulse: 62 70 67 (!) 54  Resp: 20 20 20 20   Temp: 98.5 F (36.9 C) 98.4 F (36.9 C) 98.4 F (36.9 C) 98.2 F (36.8 C)  TempSrc: Oral Oral Oral Oral  SpO2: 94% 95% 93% 95%  Weight:      Height:       CBC Latest Ref Rng & Units 08/19/2019 07/22/2019 12/21/2016  WBC 4.0 - 10.5 K/uL 8.4 6.7 6.5  Hemoglobin 13.0 - 17.0 g/dL 11.7(L) 13.1 13.8  Hematocrit 39.0 - 52.0 % 37.4(L) 42.6 42.6  Platelets 150 - 400 K/uL 417(H) 269 249   BMP Latest Ref Rng & Units 08/19/2019 07/22/2019 12/21/2016  Glucose 70 - 99 mg/dL 146(H) 131(H) 210(H)  BUN 8 - 23 mg/dL 15 14 10   Creatinine 0.61 - 1.24 mg/dL 1.34(H) 1.18 1.12  Sodium 135 - 145 mmol/L 138 140 141  Potassium 3.5 - 5.1 mmol/L 4.1 5.1 4.0  Chloride 98 - 111 mmol/L 97(L) 103 102  CO2 22 - 32 mmol/L 27 28 28   Calcium 8.9 - 10.3 mg/dL 9.2 9.2 9.1   Intake/Output      11/30 0701 - 12/01 0700 12/01 0701 - 12/02 0700   I.V. (mL/kg) 600 (5.9)    Total Intake(mL/kg) 600 (5.9)    Blood 150    Total Output 150    Net +450         Urine Occurrence 2 x       Physical Exam: General: NAD.  Upright in bed.  Calm conversant. Resp: No increased wob Cardio: regular rate and rhythm ABD soft Neurologically intact MSK LUE:  Neurovascularly intact Sensation intact distally Hand warm Incision: dressing C/D/I   Assessment: 1 Day Post-Op  S/P Procedure(s) (LRB): Revision LEFT REVERSE SHOULDER ARTHROPLASTY (Left) by Dr. Ernesta Amble. Percell Miller on 08/19/2019  Active Problems:   Degenerative arthritis of left shoulder region   Primary osteoarthritis, status post shoulder arthroplasty, revision Doing well postop day 1 Tolerating diet and voiding Pain control Mobilizing well   Plan: Incentive Spirometry Sling Apply ice as  needed   Weight Bearing: Non Weight Bearing (NWB) LUE Dressings: Maintain Dressing.   VTE prophylaxis: Aspirin resumed, SCDs, ambulation  Dispo: Home this morning   Seth Burly III, PA-C 08/20/2019, 7:03 AM

## 2019-08-20 NOTE — Evaluation (Signed)
Occupational Therapy Evaluation Patient Details Name: Seth Lewis MRN: [id number] DOB: 05-30-1946 Today's Date: 08/20/2019    History of Present Illness Pt isa  73 y/o male with PMH of kidney sones, DM, PTSD, HTN, OAA on CPAP, R shoulder surgery, OA L shoulder. S/P L revision reverse total shoulde arthroplasty 11/30 (inital surgery 11/10).    Clinical Impression   Patient admitted for above and limited by decreased functional use of L UE post surgery.  Patient educated on precautions, sling mgmt, exercises, recommendations and ADL compensatory techniques. Pt requires min assist for ADLs, modified independent for transfers. Provided education handouts and reviewed thoroughly with patient.  He will have support of spouse as needed.  Based on performance today, no further OT needs identified. OT signing off.     Follow Up Recommendations  No OT follow up;Follow surgeon's recommendation for DC plan and follow-up therapies    Equipment Recommendations  None recommended by OT    Recommendations for Other Services       Precautions / Restrictions Precautions Precautions: Shoulder Type of Shoulder Precautions: no shoulder ROM, AROM elbow/wrist/hand  Shoulder Interventions: Shoulder sling/immobilizer;At all times;Off for dressing/bathing/exercises Precaution Booklet Issued: Yes (comment) Precaution Comments: reviewed precautions with pt  Required Braces or Orthoses: Sling Restrictions Weight Bearing Restrictions: Yes LUE Weight Bearing: Non weight bearing      Mobility Bed Mobility Overal bed mobility: Modified Independent                Transfers Overall transfer level: Modified independent                    Balance Overall balance assessment: Modified Independent                                         ADL either performed or assessed with clinical judgement   ADL Overall ADL's : Needs assistance/impaired     Grooming: Set up;Sitting    Upper Body Bathing: Minimal assistance;Sitting   Lower Body Bathing: Minimal assistance   Upper Body Dressing : Minimal assistance;Sitting   Lower Body Dressing: Minimal assistance;Sit to/from stand   Toilet Transfer: Ambulation;Modified Independent   Toileting- Architect and Hygiene: Supervision/safety;Sit to/from stand       Functional mobility during ADLs: Modified independent General ADL Comments: pt educated on shoulder precautions, ADL compensatory techniques, recommendations and exercises to L shoulder      Vision Patient Visual Report: No change from baseline       Perception     Praxis      Pertinent Vitals/Pain Pain Assessment: Faces Faces Pain Scale: Hurts a little bit Pain Location: L UE with exercises Pain Descriptors / Indicators: Discomfort;Grimacing;Operative site guarding Pain Intervention(s): Monitored during session;Repositioned;Premedicated before session     Hand Dominance Left   Extremity/Trunk Assessment Upper Extremity Assessment Upper Extremity Assessment: LUE deficits/detail LUE Deficits / Details: s/p L shoulder surgery  LUE: Unable to fully assess due to immobilization LUE Sensation: WNL LUE Coordination: decreased fine motor;decreased gross motor   Lower Extremity Assessment Lower Extremity Assessment: Overall WFL for tasks assessed       Communication Communication Communication: No difficulties   Cognition Arousal/Alertness: Awake/alert Behavior During Therapy: WFL for tasks assessed/performed Overall Cognitive Status: Within Functional Limits for tasks assessed  General Comments       Exercises Exercises: Shoulder;Hand exercises Shoulder Exercises Elbow Flexion: AAROM;Left;10 reps;Seated Elbow Extension: AAROM;Left;10 reps;Seated Wrist Flexion: AROM;Left;10 reps;Seated Wrist Extension: AROM;Left;10 reps;Seated Digit Composite Flexion: AROM;Left;10  reps;Seated Composite Extension: AROM;Left;10 reps;Seated Hand Exercises Forearm Supination: AROM;Left;10 reps;Seated Forearm Pronation: AROM;Left;10 reps;Seated   Shoulder Instructions Shoulder Instructions Donning/doffing shirt without moving shoulder: Minimal assistance;Patient able to independently direct caregiver Method for sponge bathing under operated UE: Minimal assistance;Patient able to independently direct caregiver Donning/doffing sling/immobilizer: Minimal assistance;Patient able to independently direct caregiver Correct positioning of sling/immobilizer: Minimal assistance;Patient able to independently direct caregiver ROM for elbow, wrist and digits of operated UE: Minimal assistance;Patient able to independently direct caregiver Sling wearing schedule (on at all times/off for ADL's): Modified independent Proper positioning of operated UE when showering: Minimal assistance;Patient able to independently direct caregiver Positioning of UE while sleeping: Minimal assistance;Patient able to independently direct caregiver    Home Living Family/patient expects to be discharged to:: Private residence Living Arrangements: Spouse/significant other Available Help at Discharge: Family;Available 24 hours/day Type of Home: House Home Access: Stairs to enter     Home Layout: One level     Bathroom Shower/Tub: Teacher, early years/pre: Standard     Home Equipment: Shower seat          Prior Functioning/Environment Level of Independence: Independent                 OT Problem List: Decreased activity tolerance;Decreased range of motion;Pain;Decreased knowledge of precautions;Decreased knowledge of use of DME or AE      OT Treatment/Interventions:      OT Goals(Current goals can be found in the care plan section) Acute Rehab OT Goals Patient Stated Goal: home today OT Goal Formulation: With patient  OT Frequency:     Barriers to D/C:             Co-evaluation              AM-PAC OT "6 Clicks" Daily Activity     Outcome Measure Help from another person eating meals?: None Help from another person taking care of personal grooming?: A Little Help from another person toileting, which includes using toliet, bedpan, or urinal?: A Little Help from another person bathing (including washing, rinsing, drying)?: A Little Help from another person to put on and taking off regular upper body clothing?: A Little Help from another person to put on and taking off regular lower body clothing?: A Little 6 Click Score: 19   End of Session Equipment Utilized During Treatment: Other (comment)(sling ) Nurse Communication: Mobility status  Activity Tolerance: Patient tolerated treatment well Patient left: Other (comment);with call bell/phone within reach(EOB)  OT Visit Diagnosis: Pain Pain - Right/Left: Left Pain - part of body: Shoulder                Time: 7026-3785 OT Time Calculation (min): 40 min Charges:  OT General Charges $OT Visit: 1 Visit OT Evaluation $OT Eval Low Complexity: 1 Low OT Treatments $Self Care/Home Management : 8-22 mins $Therapeutic Exercise: 8-22 mins  Delight Stare, OT Acute Rehabilitation Services Pager [phone] Office [phone]   Delight Stare 08/20/2019, 10:15 AM

## 2019-08-20 NOTE — Discharge Summary (Signed)
Discharge Summary  Patient ID: Seth Lewis MRN: [id number] DOB/AGE: Sep 06, 1946 73 y.o.  Admit date: 08/19/2019 Discharge date: 08/20/2019  Admission Diagnoses:  <principal problem not specified>  Discharge Diagnoses:  Active Problems:   Degenerative arthritis of left shoulder region   Past Medical History:  Diagnosis Date  . 1st degree AV block 07/22/2019   Noted on EKG   . Arthritis   . History of kidney stones    Multiple stones- no loss of kidney function  . History of pneumothorax    2005 w/ chest tube  . Hypertension   . Numbness and tingling in both hands   . OSA on CPAP    moderate osa per study 02-01-2011  . Pneumonia   . PTSD (post-traumatic stress disorder)   . Right hydrocele   . Sinus bradycardia by electrocardiogram 07/22/2019  . Type 2 diabetes mellitus (HCC)     Surgeries: Procedure(s): Revision LEFT REVERSE SHOULDER ARTHROPLASTY on 08/19/2019   Consultants (if any):   Discharged Condition: Improved  Hospital Course: Seth Lewis is an 73 y.o. male who was admitted 08/19/2019 with a diagnosis of <principal problem not specified> and went to the operating room on 08/19/2019 and underwent the above named procedures.    He was given perioperative antibiotics:  Anti-infectives (From admission, onward)   Start     Dose/Rate Route Frequency Ordered Stop   08/19/19 1330  ceFAZolin (ANCEF) IVPB 1 g/50 mL premix     1 g 100 mL/hr over 30 Minutes Intravenous Every 6 hours 08/19/19 1027 08/20/19 0043   08/19/19 0600  ceFAZolin (ANCEF) IVPB 2g/100 mL premix     2 g 200 mL/hr over 30 Minutes Intravenous On call to O.R. 08/19/19 [id number] 08/19/19 0728    .  He was given sequential compression devices, early ambulation for DVT prophylaxis.  Aspirin was resumed.  He benefited maximally from the hospital stay and there were no complications.    Recent vital signs:  Vitals:   08/19/19 2303 08/20/19 0439  BP: 131/74 (!) 107/91  Pulse: 67 (!) 54  Resp:  20 20  Temp: 98.4 F (36.9 C) 98.2 F (36.8 C)  SpO2: 93% 95%    Recent laboratory studies:  Lab Results  Component Value Date   HGB 11.7 (L) 08/19/2019   HGB 13.1 07/22/2019   HGB 13.8 12/21/2016   Lab Results  Component Value Date   WBC 8.4 08/19/2019   PLT 417 (H) 08/19/2019   Lab Results  Component Value Date   INR 1.11 09/27/2015   Lab Results  Component Value Date   NA 138 08/19/2019   K 4.1 08/19/2019   CL 97 (L) 08/19/2019   CO2 27 08/19/2019   BUN 15 08/19/2019   CREATININE 1.34 (H) 08/19/2019   GLUCOSE 146 (H) 08/19/2019    Discharge Medications:   Allergies as of 08/20/2019   No Known Allergies     Medication List    TAKE these medications   acetaminophen 500 MG tablet Commonly known as: TYLENOL Take 2 tablets (1,000 mg total) by mouth every 8 (eight) hours for 10 days. For Pain.   AndroGel Pump 20.25 MG/ACT (1.62%) Gel Generic drug: Testosterone Apply 3 Squirts topically every other day. To shoulders   aspirin EC 81 MG tablet Take 81 mg by mouth daily.   celecoxib 200 MG capsule Commonly known as: CeleBREX Take 1 capsule (200 mg total) by mouth 2 (two) times daily for 14 days. For 2 weeks post op  for pain and inflammation.  Discontinue Ibuprofen or other Anti-inflammatory medicine when taking this medicine.   docusate sodium 100 MG capsule Commonly known as: Colace Take 1 capsule (100 mg total) by mouth 2 (two) times daily. To prevent constipation while taking pain medication.   gabapentin 100 MG capsule Commonly known as: Neurontin Take 1 capsule (100 mg total) by mouth 2 (two) times daily for 14 days. For pain.   metFORMIN 500 MG 24 hr tablet Commonly known as: GLUCOPHAGE-XR Take 500 mg by mouth 2 (two) times daily.   metoprolol tartrate 100 MG tablet Commonly known as: LOPRESSOR Take 50 mg by mouth 2 (two) times daily.   multivitamin with minerals Tabs tablet Take 1 tablet by mouth daily.   NIFEdipine 60 MG 24 hr  tablet Commonly known as: PROCARDIA XL/NIFEDICAL XL Take 60 mg by mouth every evening.   omeprazole 20 MG capsule Commonly known as: PriLOSEC Take 1 capsule (20 mg total) by mouth daily. 30 days for gastroprotection while taking NSAIDs.   oxyCODONE 5 MG immediate release tablet Commonly known as: Roxicodone Take 1 tablet (5 mg total) by mouth every 4 (four) hours as needed for up to 7 days for breakthrough pain. What changed:   how much to take  reasons to take this   prazosin 1 MG capsule Commonly known as: MINIPRESS Take 2 mg by mouth at bedtime.   simvastatin 80 MG tablet Commonly known as: ZOCOR Take 40 mg by mouth 2 (two) times daily.   venlafaxine XR 75 MG 24 hr capsule Commonly known as: EFFEXOR-XR Take 75 mg by mouth at bedtime.   Vitamin D3 125 MCG (5000 UT) Tabs Take 5,000 Units by mouth daily.       Diagnostic Studies: Dg Shoulder Left Port  Result Date: 08/19/2019 CLINICAL DATA:  Revision of left total shoulder arthroplasty. EXAM: LEFT SHOULDER COMPARISON:  08/14/2019 FINDINGS: The glenosphere has been replaced. Components appear in good position in the [place] projection. IMPRESSION: Satisfactory appearance of the total left shoulder replacement after revision. Electronically Signed   By: Lorriane Shire M.D.   On: 08/19/2019 10:13   Dg Shoulder Left Port  Result Date: 07/30/2019 CLINICAL DATA:  Left shoulder arthroplasty. EXAM: LEFT SHOULDER COMPARISON:  CT 06/24/2019. FINDINGS: Total left shoulder arthroplasty. Hardware intact. Anatomic alignment. Prior cervical spine fusion. Cardiomegaly. Left base atelectasis/infiltrate left pleural effusion cannot be excluded. IMPRESSION: 1.  Total left shoulder arthroplasty with anatomic alignment. 2. Cardiomegaly. Left base atelectasis/infiltrate left pleural effusion cannot be excluded. Electronically Signed   By: Marcello Moores  Register   On: 07/30/2019 13:54    Disposition:  There are no questions and answers to display.           Follow-up Information    Renette Butters, MD Follow up.   Specialty: Orthopedic Surgery Contact information: [address] [place] [address] [phone]            Signed: Prudencio Burly III PA-C 08/20/2019, 7:05 AM

## 2019-08-20 NOTE — Plan of Care (Signed)
Patient alert and oriented, mae's well, voiding adequate amount of urine, swallowing without difficulty, c/o soreness at time of discharge and ice pack given. Patient discharged home with family. Script and discharged instructions given to patient. Patient and family stated understanding of instructions given. Patient has an appointment with Dr. Percell Miller

## 2019-08-21 ENCOUNTER — Encounter (HOSPITAL_COMMUNITY): Payer: Self-pay | Admitting: Orthopedic Surgery

## 2019-10-20 ENCOUNTER — Ambulatory Visit: Payer: Medicare Other

## 2019-10-24 ENCOUNTER — Ambulatory Visit: Payer: Medicare Other

## 2019-10-31 ENCOUNTER — Ambulatory Visit: Payer: Medicare Other

## 2020-10-02 DIAGNOSIS — M8580 Other specified disorders of bone density and structure, unspecified site: Secondary | ICD-10-CM | POA: Diagnosis not present

## 2020-10-02 DIAGNOSIS — I10 Essential (primary) hypertension: Secondary | ICD-10-CM | POA: Diagnosis not present

## 2020-10-02 DIAGNOSIS — E119 Type 2 diabetes mellitus without complications: Secondary | ICD-10-CM | POA: Diagnosis not present

## 2020-10-02 DIAGNOSIS — E1169 Type 2 diabetes mellitus with other specified complication: Secondary | ICD-10-CM | POA: Diagnosis not present

## 2020-10-02 DIAGNOSIS — E7800 Pure hypercholesterolemia, unspecified: Secondary | ICD-10-CM | POA: Diagnosis not present

## 2020-10-02 DIAGNOSIS — H35033 Hypertensive retinopathy, bilateral: Secondary | ICD-10-CM | POA: Diagnosis not present

## 2020-10-27 DIAGNOSIS — N200 Calculus of kidney: Secondary | ICD-10-CM | POA: Diagnosis not present

## 2020-10-29 DIAGNOSIS — H25813 Combined forms of age-related cataract, bilateral: Secondary | ICD-10-CM | POA: Diagnosis not present

## 2020-10-29 DIAGNOSIS — H524 Presbyopia: Secondary | ICD-10-CM | POA: Diagnosis not present

## 2020-10-29 DIAGNOSIS — H52202 Unspecified astigmatism, left eye: Secondary | ICD-10-CM | POA: Diagnosis not present

## 2020-10-29 DIAGNOSIS — H5201 Hypermetropia, right eye: Secondary | ICD-10-CM | POA: Diagnosis not present

## 2020-10-29 DIAGNOSIS — E1136 Type 2 diabetes mellitus with diabetic cataract: Secondary | ICD-10-CM | POA: Diagnosis not present

## 2020-11-05 DIAGNOSIS — F17290 Nicotine dependence, other tobacco product, uncomplicated: Secondary | ICD-10-CM | POA: Diagnosis not present

## 2020-11-05 DIAGNOSIS — G4730 Sleep apnea, unspecified: Secondary | ICD-10-CM | POA: Diagnosis not present

## 2020-11-05 DIAGNOSIS — I440 Atrioventricular block, first degree: Secondary | ICD-10-CM | POA: Diagnosis not present

## 2020-11-05 DIAGNOSIS — E119 Type 2 diabetes mellitus without complications: Secondary | ICD-10-CM | POA: Diagnosis not present

## 2020-11-05 DIAGNOSIS — R0789 Other chest pain: Secondary | ICD-10-CM | POA: Diagnosis not present

## 2020-11-05 DIAGNOSIS — R001 Bradycardia, unspecified: Secondary | ICD-10-CM | POA: Diagnosis not present

## 2020-11-05 DIAGNOSIS — I10 Essential (primary) hypertension: Secondary | ICD-10-CM | POA: Diagnosis not present

## 2020-11-06 DIAGNOSIS — I10 Essential (primary) hypertension: Secondary | ICD-10-CM | POA: Diagnosis not present

## 2020-11-06 DIAGNOSIS — E7800 Pure hypercholesterolemia, unspecified: Secondary | ICD-10-CM | POA: Diagnosis not present

## 2020-11-06 DIAGNOSIS — Z0000 Encounter for general adult medical examination without abnormal findings: Secondary | ICD-10-CM | POA: Diagnosis not present

## 2020-11-06 DIAGNOSIS — E349 Endocrine disorder, unspecified: Secondary | ICD-10-CM | POA: Diagnosis not present

## 2020-11-06 DIAGNOSIS — E1169 Type 2 diabetes mellitus with other specified complication: Secondary | ICD-10-CM | POA: Diagnosis not present

## 2020-11-06 DIAGNOSIS — Z1389 Encounter for screening for other disorder: Secondary | ICD-10-CM | POA: Diagnosis not present

## 2020-12-02 DIAGNOSIS — H2511 Age-related nuclear cataract, right eye: Secondary | ICD-10-CM | POA: Diagnosis not present

## 2020-12-09 DIAGNOSIS — E1169 Type 2 diabetes mellitus with other specified complication: Secondary | ICD-10-CM | POA: Diagnosis not present

## 2020-12-09 DIAGNOSIS — H35033 Hypertensive retinopathy, bilateral: Secondary | ICD-10-CM | POA: Diagnosis not present

## 2020-12-09 DIAGNOSIS — I10 Essential (primary) hypertension: Secondary | ICD-10-CM | POA: Diagnosis not present

## 2020-12-09 DIAGNOSIS — M8580 Other specified disorders of bone density and structure, unspecified site: Secondary | ICD-10-CM | POA: Diagnosis not present

## 2020-12-09 DIAGNOSIS — E7800 Pure hypercholesterolemia, unspecified: Secondary | ICD-10-CM | POA: Diagnosis not present

## 2020-12-23 DIAGNOSIS — H2512 Age-related nuclear cataract, left eye: Secondary | ICD-10-CM | POA: Diagnosis not present

## 2021-01-05 DIAGNOSIS — M8580 Other specified disorders of bone density and structure, unspecified site: Secondary | ICD-10-CM | POA: Diagnosis not present

## 2021-01-05 DIAGNOSIS — E7800 Pure hypercholesterolemia, unspecified: Secondary | ICD-10-CM | POA: Diagnosis not present

## 2021-01-05 DIAGNOSIS — H35033 Hypertensive retinopathy, bilateral: Secondary | ICD-10-CM | POA: Diagnosis not present

## 2021-01-05 DIAGNOSIS — I10 Essential (primary) hypertension: Secondary | ICD-10-CM | POA: Diagnosis not present

## 2021-01-05 DIAGNOSIS — E1169 Type 2 diabetes mellitus with other specified complication: Secondary | ICD-10-CM | POA: Diagnosis not present

## 2021-01-28 DIAGNOSIS — Z20822 Contact with and (suspected) exposure to covid-19: Secondary | ICD-10-CM | POA: Diagnosis not present

## 2021-01-28 DIAGNOSIS — U071 COVID-19: Secondary | ICD-10-CM | POA: Diagnosis not present

## 2021-02-08 DIAGNOSIS — M8580 Other specified disorders of bone density and structure, unspecified site: Secondary | ICD-10-CM | POA: Diagnosis not present

## 2021-02-08 DIAGNOSIS — E1169 Type 2 diabetes mellitus with other specified complication: Secondary | ICD-10-CM | POA: Diagnosis not present

## 2021-02-08 DIAGNOSIS — I10 Essential (primary) hypertension: Secondary | ICD-10-CM | POA: Diagnosis not present

## 2021-02-08 DIAGNOSIS — H35033 Hypertensive retinopathy, bilateral: Secondary | ICD-10-CM | POA: Diagnosis not present

## 2021-02-08 DIAGNOSIS — E7800 Pure hypercholesterolemia, unspecified: Secondary | ICD-10-CM | POA: Diagnosis not present

## 2021-03-26 DIAGNOSIS — E7800 Pure hypercholesterolemia, unspecified: Secondary | ICD-10-CM | POA: Diagnosis not present

## 2021-03-26 DIAGNOSIS — H35033 Hypertensive retinopathy, bilateral: Secondary | ICD-10-CM | POA: Diagnosis not present

## 2021-03-26 DIAGNOSIS — I10 Essential (primary) hypertension: Secondary | ICD-10-CM | POA: Diagnosis not present

## 2021-03-26 DIAGNOSIS — M8580 Other specified disorders of bone density and structure, unspecified site: Secondary | ICD-10-CM | POA: Diagnosis not present

## 2021-03-26 DIAGNOSIS — E1169 Type 2 diabetes mellitus with other specified complication: Secondary | ICD-10-CM | POA: Diagnosis not present

## 2021-04-26 DIAGNOSIS — Z7984 Long term (current) use of oral hypoglycemic drugs: Secondary | ICD-10-CM | POA: Diagnosis not present

## 2021-04-26 DIAGNOSIS — I700 Atherosclerosis of aorta: Secondary | ICD-10-CM | POA: Diagnosis not present

## 2021-04-26 DIAGNOSIS — E349 Endocrine disorder, unspecified: Secondary | ICD-10-CM | POA: Diagnosis not present

## 2021-04-26 DIAGNOSIS — Z86010 Personal history of colonic polyps: Secondary | ICD-10-CM | POA: Diagnosis not present

## 2021-04-26 DIAGNOSIS — E7800 Pure hypercholesterolemia, unspecified: Secondary | ICD-10-CM | POA: Diagnosis not present

## 2021-04-26 DIAGNOSIS — I10 Essential (primary) hypertension: Secondary | ICD-10-CM | POA: Diagnosis not present

## 2021-04-26 DIAGNOSIS — M6208 Separation of muscle (nontraumatic), other site: Secondary | ICD-10-CM | POA: Diagnosis not present

## 2021-04-26 DIAGNOSIS — E1169 Type 2 diabetes mellitus with other specified complication: Secondary | ICD-10-CM | POA: Diagnosis not present

## 2021-04-26 DIAGNOSIS — D649 Anemia, unspecified: Secondary | ICD-10-CM | POA: Diagnosis not present

## 2021-05-11 ENCOUNTER — Other Ambulatory Visit: Payer: Self-pay

## 2021-05-11 ENCOUNTER — Emergency Department (HOSPITAL_COMMUNITY)
Admission: EM | Admit: 2021-05-11 | Discharge: 2021-05-12 | Disposition: A | Discharge status: Home / Self Care | Payer: Medicare Other | Attending: Emergency Medicine | Admitting: Emergency Medicine

## 2021-05-11 DIAGNOSIS — J9811 Atelectasis: Secondary | ICD-10-CM | POA: Diagnosis not present

## 2021-05-11 DIAGNOSIS — E119 Type 2 diabetes mellitus without complications: Secondary | ICD-10-CM | POA: Insufficient documentation

## 2021-05-11 DIAGNOSIS — R519 Headache, unspecified: Secondary | ICD-10-CM | POA: Insufficient documentation

## 2021-05-11 DIAGNOSIS — Z87891 Personal history of nicotine dependence: Secondary | ICD-10-CM | POA: Insufficient documentation

## 2021-05-11 DIAGNOSIS — M5412 Radiculopathy, cervical region: Secondary | ICD-10-CM | POA: Insufficient documentation

## 2021-05-11 DIAGNOSIS — J439 Emphysema, unspecified: Secondary | ICD-10-CM | POA: Diagnosis not present

## 2021-05-11 DIAGNOSIS — Z96612 Presence of left artificial shoulder joint: Secondary | ICD-10-CM | POA: Diagnosis not present

## 2021-05-11 DIAGNOSIS — R001 Bradycardia, unspecified: Secondary | ICD-10-CM | POA: Diagnosis not present

## 2021-05-11 DIAGNOSIS — K219 Gastro-esophageal reflux disease without esophagitis: Secondary | ICD-10-CM

## 2021-05-11 DIAGNOSIS — Z7982 Long term (current) use of aspirin: Secondary | ICD-10-CM | POA: Diagnosis not present

## 2021-05-11 DIAGNOSIS — Z96611 Presence of right artificial shoulder joint: Secondary | ICD-10-CM | POA: Diagnosis not present

## 2021-05-11 DIAGNOSIS — R0789 Other chest pain: Secondary | ICD-10-CM | POA: Diagnosis present

## 2021-05-11 DIAGNOSIS — R0602 Shortness of breath: Secondary | ICD-10-CM | POA: Diagnosis not present

## 2021-05-11 DIAGNOSIS — Z7984 Long term (current) use of oral hypoglycemic drugs: Secondary | ICD-10-CM | POA: Diagnosis not present

## 2021-05-11 DIAGNOSIS — I10 Essential (primary) hypertension: Secondary | ICD-10-CM | POA: Diagnosis not present

## 2021-05-11 DIAGNOSIS — Z79899 Other long term (current) drug therapy: Secondary | ICD-10-CM | POA: Diagnosis not present

## 2021-05-11 DIAGNOSIS — R079 Chest pain, unspecified: Secondary | ICD-10-CM | POA: Diagnosis not present

## 2021-05-11 NOTE — ED Triage Notes (Signed)
Pt c/o headache and pain that radiates down his left arm.

## 2021-05-12 ENCOUNTER — Emergency Department (HOSPITAL_COMMUNITY): Payer: Medicare Other

## 2021-05-12 DIAGNOSIS — J9811 Atelectasis: Secondary | ICD-10-CM | POA: Diagnosis not present

## 2021-05-12 DIAGNOSIS — R0602 Shortness of breath: Secondary | ICD-10-CM | POA: Diagnosis not present

## 2021-05-12 DIAGNOSIS — J439 Emphysema, unspecified: Secondary | ICD-10-CM | POA: Diagnosis not present

## 2021-05-12 DIAGNOSIS — R519 Headache, unspecified: Secondary | ICD-10-CM | POA: Diagnosis not present

## 2021-05-12 DIAGNOSIS — R079 Chest pain, unspecified: Secondary | ICD-10-CM | POA: Diagnosis not present

## 2021-05-12 LAB — CBC WITH DIFFERENTIAL/PLATELET
Abs Immature Granulocytes: 0.03 10*3/uL (ref 0.00–0.07)
Basophils Absolute: 0.1 10*3/uL (ref 0.0–0.1)
Basophils Relative: 1 %
Eosinophils Absolute: 0.1 10*3/uL (ref 0.0–0.5)
Eosinophils Relative: 1 %
HCT: 40 % (ref 39.0–52.0)
Hemoglobin: 12.8 g/dL — ABNORMAL LOW (ref 13.0–17.0)
Immature Granulocytes: 0 %
Lymphocytes Relative: 18 %
Lymphs Abs: 1.9 10*3/uL (ref 0.7–4.0)
MCH: 30.8 pg (ref 26.0–34.0)
MCHC: 32 g/dL (ref 30.0–36.0)
MCV: 96.2 fL (ref 80.0–100.0)
Monocytes Absolute: 0.8 10*3/uL (ref 0.1–1.0)
Monocytes Relative: 8 %
Neutro Abs: 7.7 10*3/uL (ref 1.7–7.7)
Neutrophils Relative %: 72 %
Platelets: 269 10*3/uL (ref 150–400)
RBC: 4.16 MIL/uL — ABNORMAL LOW (ref 4.22–5.81)
RDW: 14.6 % (ref 11.5–15.5)
WBC: 10.6 10*3/uL — ABNORMAL HIGH (ref 4.0–10.5)
nRBC: 0 % (ref 0.0–0.2)

## 2021-05-12 LAB — COMPREHENSIVE METABOLIC PANEL
ALT: 30 U/L (ref 0–44)
AST: 28 U/L (ref 15–41)
Albumin: 3.4 g/dL — ABNORMAL LOW (ref 3.5–5.0)
Alkaline Phosphatase: 72 U/L (ref 38–126)
Anion gap: 8 (ref 5–15)
BUN: 14 mg/dL (ref 8–23)
CO2: 28 mmol/L (ref 22–32)
Calcium: 9.4 mg/dL (ref 8.9–10.3)
Chloride: 101 mmol/L (ref 98–111)
Creatinine, Ser: 1.32 mg/dL — ABNORMAL HIGH (ref 0.61–1.24)
GFR, Estimated: 56 mL/min — ABNORMAL LOW (ref >60)
Glucose, Bld: 123 mg/dL — ABNORMAL HIGH (ref 70–99)
Potassium: 3.9 mmol/L (ref 3.5–5.1)
Sodium: 137 mmol/L (ref 135–145)
Total Bilirubin: 0.6 mg/dL (ref 0.3–1.2)
Total Protein: 7.5 g/dL (ref 6.5–8.1)

## 2021-05-12 LAB — TROPONIN I (HIGH SENSITIVITY)
Troponin I (High Sensitivity): 7 ng/L (ref <18)
Troponin I (High Sensitivity): 6 ng/L (ref <18)

## 2021-05-12 LAB — LIPASE, BLOOD: Lipase: 35 U/L (ref 11–51)

## 2021-05-12 MED ORDER — ALUM & MAG HYDROXIDE-SIMETH 200-200-20 MG/5ML PO SUSP
30.0000 mL | Freq: Once | ORAL | Status: AC
  Administered 2021-05-12: 30 mL via ORAL
  Filled 2021-05-12: qty 30

## 2021-05-12 MED ORDER — LIDOCAINE VISCOUS HCL 2 % MT SOLN
15.0000 mL | Freq: Once | OROMUCOSAL | Status: AC
  Administered 2021-05-12: 15 mL via ORAL
  Filled 2021-05-12: qty 15

## 2021-05-12 MED ORDER — PANTOPRAZOLE SODIUM 40 MG IV SOLR
40.0000 mg | Freq: Once | INTRAVENOUS | Status: AC
  Administered 2021-05-12: 40 mg via INTRAVENOUS
  Filled 2021-05-12: qty 40

## 2021-05-12 MED ORDER — PANTOPRAZOLE SODIUM 40 MG PO TBEC: 40.0000 mg | DELAYED_RELEASE_TABLET | Freq: Every day | ORAL | 3 refills | Status: DC

## 2021-05-12 MED ORDER — FAMOTIDINE IN NACL 20-0.9 MG/50ML-% IV SOLN
20.0000 mg | Freq: Once | INTRAVENOUS | Status: AC
  Administered 2021-05-12: 20 mg via INTRAVENOUS
  Filled 2021-05-12: qty 50
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
## 2021-05-12 NOTE — ED Provider Notes (Signed)
Emergency Medicine Provider Triage Evaluation Note  Seth Lewis , a 75 y.o. male  was evaluated in triage.  Pt complains of headache.  The patient reports that at approximately 1500 that he developed sudden onset belching accompanied by a severe, sharp left-sided headache that radiates down the left side of his neck into the left side of his chest accompanied by chest pain, shortness of breath with exertion, dizziness, and gait instability.  No history of headaches.  He has not fallen.  He reports that he has been unable to stand up straight due to the worsening of pain in his neck and head.  No vomiting, fever, chills, numbness, weakness, visual changes, diaphoresis, paresthesias, facial droop, slurred speech.  No history of GERD.  Family history of CAD with his father.  Review of Systems  Positive: Headache, chest pain, shortness of breath, dizziness, gait instability, neck pain Negative: Vomiting, fever, chills, numbness, weakness, visual changes, diaphoresis, paresthesias, facial droop, slurred speech  Physical Exam  BP 108/71 (BP Location: Right Arm)   Pulse (!) 54   Temp 99.2 F (37.3 C) (Oral)   Resp 17   Ht 6\' 1"  (1.854 m)   Wt 99.8 kg   SpO2 100%   BMI 29.03 kg/m  Gen:   Awake, no distress  Resp:  Normal effort  MSK:   Moves extremities without difficulty  Other:  Patient is able to stand and ambulate.  No ataxia.  Cranial nerves II through XII are grossly intact.  Sensation is intact to the bilateral upper and lower extremities.  Good strength against resistance.  No pronator drift.  Medical Decision Making  Medically screening exam initiated at 12:04 AM.  Appropriate orders placed.  JIBRAN CROOKSHANKS was informed that the remainder of the evaluation will be completed by another provider, this initial triage assessment does not replace that evaluation, and the importance of remaining in the ED until their evaluation is complete.  Labs imaging have been ordered.  No neurologic  deficits found on neuro exam.  He will require further work-up and evaluation in the emergency department.   Luanna Cole A, PA-C 05/12/21 0009    05/14/21, MD 05/12/21 [phone]

## 2021-05-12 NOTE — Discharge Instructions (Signed)
Avoid NSAIDs (ibuprofen, Motrin, Aleve, Naprosyn) for your headache and neck pain.  This will worsen your reflux.  Take Tylenol, use warm compresses.  Take the Protonix every day and follow-up with your doctor for a recheck.

## 2021-05-20 DIAGNOSIS — Z23 Encounter for immunization: Secondary | ICD-10-CM | POA: Diagnosis not present

## 2021-05-20 DIAGNOSIS — R079 Chest pain, unspecified: Secondary | ICD-10-CM | POA: Diagnosis not present

## 2021-06-08 DIAGNOSIS — Z86010 Personal history of colonic polyps: Secondary | ICD-10-CM | POA: Diagnosis not present

## 2021-06-08 DIAGNOSIS — K219 Gastro-esophageal reflux disease without esophagitis: Secondary | ICD-10-CM | POA: Diagnosis not present

## 2021-07-20 DIAGNOSIS — I10 Essential (primary) hypertension: Secondary | ICD-10-CM | POA: Diagnosis not present

## 2021-07-20 DIAGNOSIS — H35033 Hypertensive retinopathy, bilateral: Secondary | ICD-10-CM | POA: Diagnosis not present

## 2021-07-20 DIAGNOSIS — K219 Gastro-esophageal reflux disease without esophagitis: Secondary | ICD-10-CM | POA: Diagnosis not present

## 2021-07-20 DIAGNOSIS — E7800 Pure hypercholesterolemia, unspecified: Secondary | ICD-10-CM | POA: Diagnosis not present

## 2021-07-20 DIAGNOSIS — M8580 Other specified disorders of bone density and structure, unspecified site: Secondary | ICD-10-CM | POA: Diagnosis not present

## 2021-07-20 DIAGNOSIS — E1169 Type 2 diabetes mellitus with other specified complication: Secondary | ICD-10-CM | POA: Diagnosis not present

## 2021-07-27 DIAGNOSIS — E1169 Type 2 diabetes mellitus with other specified complication: Secondary | ICD-10-CM | POA: Diagnosis not present

## 2022-02-15 ENCOUNTER — Emergency Department (HOSPITAL_COMMUNITY): Payer: Medicare Other

## 2022-02-15 ENCOUNTER — Observation Stay (HOSPITAL_COMMUNITY)
Admission: EM | Admit: 2022-02-15 | Discharge: 2022-02-16 | Disposition: A | Discharge status: Home / Self Care | Payer: Medicare Other | Attending: Family Medicine | Admitting: Family Medicine

## 2022-02-15 ENCOUNTER — Encounter (HOSPITAL_COMMUNITY): Payer: Self-pay

## 2022-02-15 ENCOUNTER — Other Ambulatory Visit: Payer: Self-pay

## 2022-02-15 DIAGNOSIS — R1032 Left lower quadrant pain: Secondary | ICD-10-CM | POA: Insufficient documentation

## 2022-02-15 DIAGNOSIS — E1122 Type 2 diabetes mellitus with diabetic chronic kidney disease: Secondary | ICD-10-CM | POA: Diagnosis not present

## 2022-02-15 DIAGNOSIS — G4733 Obstructive sleep apnea (adult) (pediatric): Secondary | ICD-10-CM | POA: Diagnosis not present

## 2022-02-15 DIAGNOSIS — R079 Chest pain, unspecified: Secondary | ICD-10-CM | POA: Diagnosis not present

## 2022-02-15 DIAGNOSIS — N1831 Chronic kidney disease, stage 3a: Secondary | ICD-10-CM | POA: Diagnosis not present

## 2022-02-15 DIAGNOSIS — Z7984 Long term (current) use of oral hypoglycemic drugs: Secondary | ICD-10-CM | POA: Insufficient documentation

## 2022-02-15 DIAGNOSIS — I10 Essential (primary) hypertension: Secondary | ICD-10-CM

## 2022-02-15 DIAGNOSIS — K219 Gastro-esophageal reflux disease without esophagitis: Secondary | ICD-10-CM

## 2022-02-15 DIAGNOSIS — E119 Type 2 diabetes mellitus without complications: Secondary | ICD-10-CM

## 2022-02-15 DIAGNOSIS — I129 Hypertensive chronic kidney disease with stage 1 through stage 4 chronic kidney disease, or unspecified chronic kidney disease: Secondary | ICD-10-CM | POA: Diagnosis not present

## 2022-02-15 DIAGNOSIS — R001 Bradycardia, unspecified: Secondary | ICD-10-CM

## 2022-02-15 DIAGNOSIS — Z9989 Dependence on other enabling machines and devices: Secondary | ICD-10-CM

## 2022-02-15 DIAGNOSIS — Z96612 Presence of left artificial shoulder joint: Secondary | ICD-10-CM | POA: Diagnosis not present

## 2022-02-15 DIAGNOSIS — Z87891 Personal history of nicotine dependence: Secondary | ICD-10-CM | POA: Diagnosis not present

## 2022-02-15 DIAGNOSIS — I200 Unstable angina: Secondary | ICD-10-CM

## 2022-02-15 DIAGNOSIS — Z7982 Long term (current) use of aspirin: Secondary | ICD-10-CM | POA: Insufficient documentation

## 2022-02-15 DIAGNOSIS — Z79899 Other long term (current) drug therapy: Secondary | ICD-10-CM | POA: Insufficient documentation

## 2022-02-15 LAB — CBC
HCT: 43.7 % (ref 39.0–52.0)
Hemoglobin: 13.6 g/dL (ref 13.0–17.0)
MCH: 29.7 pg (ref 26.0–34.0)
MCHC: 31.1 g/dL (ref 30.0–36.0)
MCV: 95.4 fL (ref 80.0–100.0)
Platelets: 316 10*3/uL (ref 150–400)
RBC: 4.58 MIL/uL (ref 4.22–5.81)
RDW: 14 % (ref 11.5–15.5)
WBC: 6.3 10*3/uL (ref 4.0–10.5)
nRBC: 0 % (ref 0.0–0.2)

## 2022-02-15 LAB — HEPATIC FUNCTION PANEL
ALT: 41 U/L (ref 0–44)
AST: 38 U/L (ref 15–41)
Albumin: 3.7 g/dL (ref 3.5–5.0)
Alkaline Phosphatase: 86 U/L (ref 38–126)
Bilirubin, Direct: <0.1 mg/dL (ref 0.0–0.2)
Total Bilirubin: 0.3 mg/dL (ref 0.3–1.2)
Total Protein: 7.9 g/dL (ref 6.5–8.1)

## 2022-02-15 LAB — BASIC METABOLIC PANEL
Anion gap: 7 (ref 5–15)
BUN: 16 mg/dL (ref 8–23)
CO2: 28 mmol/L (ref 22–32)
Calcium: 9.9 mg/dL (ref 8.9–10.3)
Chloride: 103 mmol/L (ref 98–111)
Creatinine, Ser: 1.34 mg/dL — ABNORMAL HIGH (ref 0.61–1.24)
GFR, Estimated: 55 mL/min — ABNORMAL LOW (ref >60)
Glucose, Bld: 132 mg/dL — ABNORMAL HIGH (ref 70–99)
Potassium: 4.3 mmol/L (ref 3.5–5.1)
Sodium: 138 mmol/L (ref 135–145)

## 2022-02-15 LAB — HEMOGLOBIN A1C
Hgb A1c MFr Bld: 7.1 % — ABNORMAL HIGH (ref 4.8–5.6)
Mean Plasma Glucose: 157.07 mg/dL

## 2022-02-15 LAB — TROPONIN I (HIGH SENSITIVITY)
Troponin I (High Sensitivity): 6 ng/L (ref <18)
Troponin I (High Sensitivity): 5 ng/L (ref <18)

## 2022-02-15 LAB — LIPASE, BLOOD: Lipase: 46 U/L (ref 11–51)

## 2022-02-15 LAB — CK: Total CK: 104 U/L (ref 49–397)

## 2022-02-15 LAB — PHOSPHORUS: Phosphorus: 3.2 mg/dL (ref 2.5–4.6)

## 2022-02-15 LAB — OSMOLALITY: Osmolality: 296 mOsm/kg — ABNORMAL HIGH (ref 275–295)

## 2022-02-15 LAB — TSH: TSH: 1.95 u[IU]/mL (ref 0.350–4.500)

## 2022-02-15 MED ORDER — ASPIRIN 81 MG PO TBEC
81.0000 mg | DELAYED_RELEASE_TABLET | Freq: Every day | ORAL | Status: DC
Start: 2022-02-16 — End: 2022-02-16
  Administered 2022-02-16: 81 mg via ORAL
  Filled 2022-02-15: qty 1

## 2022-02-15 MED ORDER — PANTOPRAZOLE SODIUM 40 MG PO TBEC: 40.0000 mg | DELAYED_RELEASE_TABLET | Freq: Every day | ORAL | Status: DC

## 2022-02-15 MED ORDER — ALUM & MAG HYDROXIDE-SIMETH 200-200-20 MG/5ML PO SUSP
30.0000 mL | Freq: Once | ORAL | Status: AC
  Administered 2022-02-15: 30 mL via ORAL
  Filled 2022-02-15: qty 30

## 2022-02-15 MED ORDER — NITROGLYCERIN 0.4 MG SL SUBL: 0.4000 mg | SUBLINGUAL_TABLET | SUBLINGUAL | Status: DC | PRN

## 2022-02-15 MED ORDER — MORPHINE SULFATE (PF) 2 MG/ML IV SOLN: 2.0000 mg | INTRAVENOUS | Status: DC | PRN

## 2022-02-15 MED ORDER — ASPIRIN 325 MG PO TABS: 325.0000 mg | ORAL_TABLET | Freq: Every day | ORAL | Status: DC

## 2022-02-15 MED ORDER — ACETAMINOPHEN 650 MG RE SUPP: 650.0000 mg | Freq: Four times a day (QID) | RECTAL | Status: DC | PRN

## 2022-02-15 MED ORDER — IOHEXOL 300 MG/ML  SOLN
100.0000 mL | Freq: Once | INTRAMUSCULAR | Status: AC | PRN
Start: 2022-02-15 — End: 2022-02-15
  Administered 2022-02-15: 100 mL via INTRAVENOUS

## 2022-02-15 MED ORDER — SODIUM CHLORIDE 0.9% FLUSH: 3.0000 mL | INTRAVENOUS | Status: DC | PRN

## 2022-02-15 MED ORDER — FAMOTIDINE IN NACL 20-0.9 MG/50ML-% IV SOLN
20.0000 mg | Freq: Once | INTRAVENOUS | Status: AC
  Administered 2022-02-15: 20 mg via INTRAVENOUS
  Filled 2022-02-15: qty 50

## 2022-02-15 MED ORDER — LIDOCAINE VISCOUS HCL 2 % MT SOLN
15.0000 mL | Freq: Once | OROMUCOSAL | Status: AC
  Administered 2022-02-15: 15 mL via ORAL
  Filled 2022-02-15: qty 15

## 2022-02-15 MED ORDER — INSULIN ASPART 100 UNIT/ML IJ SOLN
0.0000 [IU] | INTRAMUSCULAR | Status: DC
  Administered 2022-02-16: 1 [IU] via SUBCUTANEOUS
  Administered 2022-02-16: 2 [IU] via SUBCUTANEOUS

## 2022-02-15 MED ORDER — ENOXAPARIN SODIUM 40 MG/0.4ML IJ SOSY
40.0000 mg | PREFILLED_SYRINGE | INTRAMUSCULAR | Status: DC
  Administered 2022-02-16: 40 mg via SUBCUTANEOUS
  Filled 2022-02-15: qty 0.4

## 2022-02-15 MED ORDER — HYDROCODONE-ACETAMINOPHEN 5-325 MG PO TABS: 1.0000 | ORAL_TABLET | ORAL | Status: DC | PRN

## 2022-02-15 MED ORDER — ACETAMINOPHEN 325 MG PO TABS: 650.0000 mg | ORAL_TABLET | Freq: Four times a day (QID) | ORAL | Status: DC | PRN

## 2022-02-15 MED ORDER — SODIUM CHLORIDE 0.9% FLUSH
3.0000 mL | Freq: Two times a day (BID) | INTRAVENOUS | Status: DC
Start: 2022-02-15 — End: 2022-02-16
  Administered 2022-02-15: 3 mL via INTRAVENOUS

## 2022-02-15 MED ORDER — SODIUM CHLORIDE 0.9 % IV SOLN: 250.0000 mL | INTRAVENOUS | Status: DC | PRN

## 2022-02-15 NOTE — ED Notes (Signed)
Pt in CT. CT will bring him to the room

## 2022-02-15 NOTE — Assessment & Plan Note (Addendum)
ECG non ischemic trop neg , CXR wnl Echo in AM cardiology consult for possible  Stress testing hold BB Given bradycardia Chest pain is reproducible by palpation. Patient has significant muscle spasm On the left side involving different muscle groups Given restart factors will still benefit from cardiac risk stratification

## 2022-02-15 NOTE — Assessment & Plan Note (Signed)
-   Order Sensitive  SSI     -  check TSH and HgA1C  - Hold by mouth medications*  

## 2022-02-15 NOTE — ED Provider Notes (Signed)
[facility] EMERGENCY DEPARTMENT Provider Note   CSN: [id number] Arrival date & time: 02/15/22  1343     History {Add pertinent medical, surgical, social history, OB history to HPI:1} Chief Complaint  Patient presents with   Chest Pain   Weakness    Seth Lewis is a 76 y.o. male.   Chest Pain Associated symptoms: weakness   Weakness Associated symptoms: chest pain      2-3 months of chest pain with associated diaphoresis, endorsing left sided chest pain, sharp pain in the left side of his chest, radiates down his left arm, radiates to his lower back. Also radiates to the back of his neck and his left jaw. He endorses associated nausea and diaphoresis during these episodes. Sometimes wakes up in a cold sweat at night. Endorses shortness of breath. Endorses increasing trouble lying flat at night. Denies lower extremity edema.   Home Medications Prior to Admission medications   Medication Sig Start Date End Date Taking? Authorizing Provider  ANDROGEL PUMP 20.25 MG/ACT (1.62%) GEL Apply 3 Squirts topically every other day. To shoulders 10/09/15   [provider]  aspirin EC 81 MG tablet Take 81 mg by mouth daily.    [provider]  Cholecalciferol (VITAMIN D3) 125 MCG (5000 UT) TABS Take 5,000 Units by mouth daily.    [provider]  docusate sodium (COLACE) 100 MG capsule Take 1 capsule (100 mg total) by mouth 2 (two) times daily. To prevent constipation while taking pain medication. 08/20/19   Albina Billet III, PA-C  gabapentin (NEURONTIN) 100 MG capsule Take 1 capsule (100 mg total) by mouth 2 (two) times daily for 14 days. For pain. 08/20/19 09/03/19  Albina Billet III, PA-C  metFORMIN (GLUCOPHAGE-XR) 500 MG 24 hr tablet Take 500 mg by mouth 2 (two) times daily.    [provider]  metoprolol (LOPRESSOR) 100 MG tablet Take 50 mg by mouth 2 (two) times daily.     [provider]  Multiple  Vitamin (MULTIVITAMIN WITH MINERALS) TABS tablet Take 1 tablet by mouth daily.    [provider]  NIFEdipine (PROCARDIA XL/ADALAT-CC) 60 MG 24 hr tablet Take 60 mg by mouth every evening.     [provider]  pantoprazole (PROTONIX) 40 MG tablet Take 1 tablet (40 mg total) by mouth daily. 05/12/21   Gilda Crease, MD  prazosin (MINIPRESS) 1 MG capsule Take 2 mg by mouth at bedtime.    [provider]  simvastatin (ZOCOR) 80 MG tablet Take 40 mg by mouth 2 (two) times daily.  02/27/16   [provider]  venlafaxine XR (EFFEXOR-XR) 75 MG 24 hr capsule Take 75 mg by mouth at bedtime.    [provider]      Allergies    Patient has no known allergies.    Review of Systems   Review of Systems  Cardiovascular:  Positive for chest pain.  Neurological:  Positive for weakness.   Physical Exam Updated Vital Signs BP 133/77 (BP Location: Right Arm)   Pulse (!) 56   Temp 98 F (36.7 C) (Oral)   Resp 18   Ht 6\' 1"  (1.854 m)   Wt 99.3 kg   SpO2 99%   BMI 28.89 kg/m  Physical Exam  ED Results / Procedures / Treatments   Labs (all labs ordered are listed, but only abnormal results are displayed) Labs Reviewed  BASIC METABOLIC PANEL - Abnormal; Notable for the following components:  Result Value   Glucose, Bld 132 (*)    Creatinine, Ser 1.34 (*)    GFR, Estimated 55 (*)    All other components within normal limits  CBC  LIPASE, BLOOD  HEPATIC FUNCTION PANEL  TROPONIN I (HIGH SENSITIVITY)  TROPONIN I (HIGH SENSITIVITY)    EKG None  Radiology DG Chest 2 View  Result Date: 02/15/2022 CLINICAL DATA:  Chest pain EXAM: CHEST - 2 VIEW COMPARISON:  05/12/2021 FINDINGS: Cardiac size is within normal limits. There are no signs of pulmonary edema or focal pulmonary consolidation. There is decreased volume in right lung. Blunting of right lateral CP angle has not changed. There is arthroplasty in both shoulders. There is surgical  fusion in the lower cervical spine. IMPRESSION: There are no new infiltrates or signs of pulmonary edema. Electronically Signed   By: Ernie Avena M.D.   On: 02/15/2022 15:18    Procedures Procedures  {Document cardiac monitor, telemetry assessment procedure when appropriate:1}  Medications Ordered in ED Medications - No data to display  ED Course/ Medical Decision Making/ A&P                           Medical Decision Making Amount and/or Complexity of Data Reviewed Labs: ordered. Radiology: ordered.   ***  {Document critical care time when appropriate:1} {Document review of labs and clinical decision tools ie heart score, Chads2Vasc2 etc:1}  {Document your independent review of radiology images, and any outside records:1} {Document your discussion with family members, caretakers, and with consultants:1} {Document social determinants of health affecting pt's care:1} {Document your decision making why or why not admission, treatments were needed:1} Final Clinical Impression(s) / ED Diagnoses Final diagnoses:  None    Rx / DC Orders ED Discharge Orders     None

## 2022-02-15 NOTE — Assessment & Plan Note (Signed)
Continue PPI ?

## 2022-02-15 NOTE — ED Provider Triage Note (Signed)
Emergency Medicine Provider Triage Evaluation Note  Seth Lewis , a 76 y.o. male  was evaluated in triage.  Pt complains of chest pain, excessive belching, dyspnea on exertion over the last 1 to 2 months.  Significant family history of heart failure.  Has been worsening over the last 3 days.  Denies shortness of breath, headache, vision changes, diarrhea, or nausea.  Hx of umbilical hernia, bradycardia, first-degree AV block, DMT2.  Recently had EGD and colonoscopy.  Review of Systems  Positive:  Negative: As above  Physical Exam  BP 125/79 (BP Location: Right Arm)   Pulse (!) 56   Temp 98.3 F (36.8 C) (Oral)   Resp 16   Ht 6\' 1"  (1.854 m)   Wt 99.3 kg   SpO2 97%   BMI 28.89 kg/m  Gen:   Awake, no distress   Resp:  Normal effort, CTAB MSK:   Moves extremities without difficulty  Other:  Umbilical hernia appreciated on exam.  Significant belching observed with sitting, leaning, and standing motions.  Abdomen mildly soft with significant LLQ tenderness and moderate umbilical tenderness.  Bradycardic  Medical Decision Making  Medically screening exam initiated at 4:29 PM.  Appropriate orders placed.  Seth Lewis was informed that the remainder of the evaluation will be completed by another provider, this initial triage assessment does not replace that evaluation, and the importance of remaining in the ED until their evaluation is complete.  Labs, imaging, EKG ordered   Luanna Cole, Cecil Cobbs 02/15/22 1647

## 2022-02-15 NOTE — ED Triage Notes (Signed)
Pt arrived POV from home c/o CP x1 month that for the most part is constant but sometimes comes and goes.

## 2022-02-15 NOTE — Assessment & Plan Note (Signed)
Continue CPAP.  
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
## 2022-02-15 NOTE — Subjective & Objective (Signed)
Patient comes in with 1 month history of chest pain is intermittentPt come with 1 m of CP worse with exertion  Associated with dyspnea, radiated down his left arm Some nausea night sweats Trouble laying flat at night  Getting worse over past 3 days

## 2022-02-15 NOTE — ED Notes (Signed)
Dr. Doutova at bedside.  

## 2022-02-15 NOTE — Assessment & Plan Note (Addendum)
Hold BB given bradycardia Restart Procardia if blood pressure allows

## 2022-02-15 NOTE — ED Notes (Signed)
Pt sat up and given bag lunch

## 2022-02-16 ENCOUNTER — Encounter (HOSPITAL_COMMUNITY): Payer: Self-pay | Admitting: Internal Medicine

## 2022-02-16 ENCOUNTER — Observation Stay (HOSPITAL_COMMUNITY): Payer: Medicare Other

## 2022-02-16 ENCOUNTER — Other Ambulatory Visit: Payer: Self-pay | Admitting: Cardiology

## 2022-02-16 DIAGNOSIS — R072 Precordial pain: Secondary | ICD-10-CM

## 2022-02-16 LAB — COMPREHENSIVE METABOLIC PANEL
ALT: 37 U/L (ref 0–44)
AST: 37 U/L (ref 15–41)
Albumin: 3.3 g/dL — ABNORMAL LOW (ref 3.5–5.0)
Alkaline Phosphatase: 80 U/L (ref 38–126)
Anion gap: 7 (ref 5–15)
BUN: 15 mg/dL (ref 8–23)
CO2: 29 mmol/L (ref 22–32)
Calcium: 8.9 mg/dL (ref 8.9–10.3)
Chloride: 102 mmol/L (ref 98–111)
Creatinine, Ser: 1.34 mg/dL — ABNORMAL HIGH (ref 0.61–1.24)
GFR, Estimated: 55 mL/min — ABNORMAL LOW (ref >60)
Glucose, Bld: 134 mg/dL — ABNORMAL HIGH (ref 70–99)
Potassium: 4 mmol/L (ref 3.5–5.1)
Sodium: 138 mmol/L (ref 135–145)
Total Bilirubin: 0.4 mg/dL (ref 0.3–1.2)
Total Protein: 7 g/dL (ref 6.5–8.1)

## 2022-02-16 LAB — ECHOCARDIOGRAM COMPLETE
AR max vel: 2.01 cm2
AV Peak grad: 9.7 mmHg
Ao pk vel: 1.56 m/s
Area-P 1/2: 3.36 cm2
Calc EF: 58.2 %
Height: 73 in
S' Lateral: 3.1 cm
Single Plane A2C EF: 60.9 %
Single Plane A4C EF: 58.6 %
Weight: 3504 oz

## 2022-02-16 LAB — CBC
HCT: 39.6 % (ref 39.0–52.0)
Hemoglobin: 12.8 g/dL — ABNORMAL LOW (ref 13.0–17.0)
MCH: 30.6 pg (ref 26.0–34.0)
MCHC: 32.3 g/dL (ref 30.0–36.0)
MCV: 94.7 fL (ref 80.0–100.0)
Platelets: 271 10*3/uL (ref 150–400)
RBC: 4.18 MIL/uL — ABNORMAL LOW (ref 4.22–5.81)
RDW: 14 % (ref 11.5–15.5)
WBC: 7 10*3/uL (ref 4.0–10.5)
nRBC: 0 % (ref 0.0–0.2)

## 2022-02-16 LAB — CBG MONITORING, ED
Glucose-Capillary: 107 mg/dL — ABNORMAL HIGH (ref 70–99)
Glucose-Capillary: 143 mg/dL — ABNORMAL HIGH (ref 70–99)
Glucose-Capillary: 177 mg/dL — ABNORMAL HIGH (ref 70–99)
Glucose-Capillary: <10 mg/dL — CL (ref 70–99)

## 2022-02-16 MED ORDER — NITROGLYCERIN 0.4 MG SL SUBL
0.4000 mg | SUBLINGUAL_TABLET | SUBLINGUAL | Status: DC | PRN
Start: 2022-02-16 — End: 2022-02-16

## 2022-02-16 MED ORDER — NITROGLYCERIN 0.4 MG SL SUBL: 0.4000 mg | SUBLINGUAL_TABLET | SUBLINGUAL | 12 refills | Status: AC | PRN

## 2022-02-16 MED ORDER — PANTOPRAZOLE SODIUM 40 MG PO TBEC: 40.0000 mg | DELAYED_RELEASE_TABLET | Freq: Every day | ORAL | 3 refills | Status: AC

## 2022-02-16 NOTE — Discharge Summary (Signed)
Physician Discharge Summary   Patient: Seth Lewis MRN: [id number] DOB: Apr 24, 1946  Admit date:     02/15/2022  Discharge date: 02/16/22  Discharge Physician: Alberteen Samhristopher P Wilian Kwong   PCP: Blair HeysEhinger, Robert, MD     Recommendations at discharge:  Follow up with Cardiology Dr. Jacinto HalimGanji as directed for outpatient nuclear stress Follow up with PCP Dr. Manus GunningEhinger in 1 week     Discharge Diagnoses: Principal Problem:   Chest pain Active Problems:   Essential hypertension   OSA on CPAP   Type 2 diabetes mellitus (HCC)   GERD (gastroesophageal reflux disease)      Hospital Course: Seth Lewis is a 76 y.o. M with HTN, DM who presented with recurrent episodic non-exertional chest pain.  In the ER, ECG and troponins normal. CXR normal, CT abdomen and pelvis normal, rib x-rays normal, Wells score low, PE doubted. Admitted for observation overnight.  In the morning, echocardiogram showed no regional wall-motion abnormalities, normal aorta, no effusion.  No pain overnight.  Evaluated by Cardiology who recommended outpatient stress test.  Of note, I did not observe this, but other staff noted concerns that the patient has memory loss and evidence of sundowning in the hospital.  Consideration of outpatient Neuropsychiatric evaluation should be undertaken based on long-term familiarity with patient by PCP.            [facility] [place] [place] was reviewed for this patient prior to discharge.   Consultants: Cardiology Procedures performed: Echo  Disposition: Home Diet recommendation:  Discharge Diet Orders (From admission, onward)     Start     Ordered   02/16/22 0000  Diet - low sodium heart healthy        02/16/22 1502             DISCHARGE MEDICATION: Allergies as of 02/16/2022   No Known Allergies      Medication List     STOP taking these medications    docusate sodium 100 MG capsule Commonly known as: Colace   Medrol 4 MG Tbpk  tablet Generic drug: methylPREDNISolone       TAKE these medications    AndroGel Pump 20.25 MG/ACT (1.62%) Gel Generic drug: Testosterone Apply 4 Squirts topically every other day. To shoulders   aspirin EC 81 MG tablet Take 81 mg by mouth at bedtime.   CALCIUM 500/D PO Take 1 tablet by mouth daily.   Magnesium Oxide 420 MG Tabs Take 420 mg by mouth at bedtime.   metFORMIN 500 MG 24 hr tablet Commonly known as: GLUCOPHAGE-XR Take 500 mg by mouth 2 (two) times daily.   metoprolol tartrate 100 MG tablet Commonly known as: LOPRESSOR Take 50 mg by mouth 2 (two) times daily.   mirtazapine 15 MG tablet Commonly known as: REMERON Take 15 mg by mouth at bedtime.   multivitamin with minerals Tabs tablet Take 1 tablet by mouth daily.   NIFEdipine 60 MG 24 hr tablet Commonly known as: PROCARDIA XL/NIFEDICAL XL Take 60 mg by mouth daily.   nitroGLYCERIN 0.4 MG SL tablet Commonly known as: NITROSTAT Place 1 tablet (0.4 mg total) under the tongue every 5 (five) minutes as needed for chest pain.   NON FORMULARY CPAP at bedtime   pantoprazole 40 MG tablet Commonly known as: PROTONIX Take 1 tablet (40 mg total) by mouth daily.   prazosin 2 MG capsule Commonly known as: MINIPRESS Take 2 mg by mouth at bedtime.   simvastatin 80 MG tablet Commonly known as:  ZOCOR Take 40 mg by mouth daily.   venlafaxine XR 75 MG 24 hr capsule Commonly known as: EFFEXOR-XR Take 225 mg by mouth daily with breakfast.   Vitamin D3 50 MCG (2000 UT) Tabs Take 2,000 Units by mouth daily.        Follow-up Information     Blair Heys, MD. Schedule an appointment as soon as possible for a visit in 1 week(s).   Specialty: Family Medicine Why: The office will call patient. Contact information: [address] [place] [address] [phone]         Yates Decamp, MD. Schedule an appointment as soon as possible for a visit.   Specialty: Cardiology Contact  information: [address] [place] [address] [phone]                 Discharge Instructions     Diet - low sodium heart healthy   Complete by: As directed    Discharge instructions   Complete by: As directed    From Dr. Maryfrances Bunnell: You were evaluated for chest pain Here, we found that your ECG (electrical activity of the heart) was reassuring, your heart enzymes were normal, both suggesting no signs of active heart attack. You had an ultrasound of the chest (an echo) that showed normal heart size, function and normal valves.  You had imaging of the chest, abdomen and pelvis that showed no signs of lung collapse, aortic dissection, pneumonia, mass/tumor in the lung, or problems with the upper abdomen that might cause chest pain.  You had an x-ray that confirmed you have no rib fractures.  Go see Dr. Jacinto Halim in his office for follow up.  If you have chest pain that lasts more than 5 minutes, you may try nitroglycerin tabs under the tongue.  If the pain does not subside in another 5 minutes you may repeat the nitro tab.  If you have to take 3 tabs in 15 minutes, you should call your doctor immediately  It may be prudent to resume taking the antacid medicine pantoprazole/Protonix   Increase activity slowly   Complete by: As directed        Discharge Exam: Filed Weights   02/15/22 1432  Weight: 99.3 kg    General: Pt is alert, awake, not in acute distress Cardiovascular: RRR, nl S1-S2, no murmurs appreciated.   No LE edema.   Respiratory: Normal respiratory rate and rhythm.  CTAB without rales or wheezes. Abdominal: Abdomen soft and non-tender.  No distension or HSM.   Neuro/Psych: Strength symmetric in upper and lower extremities.  Judgment and insight appear normal.   Condition at discharge: good  The results of significant diagnostics from this hospitalization (including imaging, microbiology, ancillary and laboratory) are listed below for reference.    Imaging Studies: DG Chest 2 View  Result Date: 02/15/2022 CLINICAL DATA:  Chest pain EXAM: CHEST - 2 VIEW COMPARISON:  05/12/2021 FINDINGS: Cardiac size is within normal limits. There are no signs of pulmonary edema or focal pulmonary consolidation. There is decreased volume in right lung. Blunting of right lateral CP angle has not changed. There is arthroplasty in both shoulders. There is surgical fusion in the lower cervical spine. IMPRESSION: There are no new infiltrates or signs of pulmonary edema. Electronically Signed   By: Ernie Avena M.D.   On: 02/15/2022 15:18   DG Ribs Unilateral W/Chest Left  Result Date: 02/15/2022 CLINICAL DATA:  Left rib pain, weakness EXAM: LEFT RIBS AND  CHEST - 3+ VIEW COMPARISON:  05/12/2021 FINDINGS: Frontal view of the chest as well as frontal and oblique views of the left thoracic cage are obtained. Cardiac silhouette is unremarkable. No airspace disease, effusion, or pneumothorax. There are no acute displaced fractures. Unremarkable bilateral shoulder arthroplasties. IMPRESSION: 1. No acute intrathoracic process.  No displaced rib fracture. Electronically Signed   By: Sharlet Salina M.D.   On: 02/15/2022 22:11   CT Abdomen Pelvis W Contrast  Result Date: 02/15/2022 CLINICAL DATA:  Left lower quadrant abdominal pain. EXAM: CT ABDOMEN AND PELVIS WITH CONTRAST TECHNIQUE: Multidetector CT imaging of the abdomen and pelvis was performed using the standard protocol following bolus administration of intravenous contrast. RADIATION DOSE REDUCTION: This exam was performed according to the departmental dose-optimization program which includes automated exposure control, adjustment of the mA and/or kV according to patient size and/or use of iterative reconstruction technique. CONTRAST:  OMNIPAQUE IOHEXOL 300 MG/ML  SOLN COMPARISON:  CT abdomen and pelvis 03/29/2016. FINDINGS: Lower chest: No acute abnormality. Hepatobiliary: No focal liver abnormality is seen.  There is fatty infiltration of the liver. Status post cholecystectomy. No biliary dilatation. Pancreas: Unremarkable. No pancreatic ductal dilatation or surrounding inflammatory changes. Spleen: Normal in size without focal abnormality. Adrenals/Urinary Tract: There is no evidence for hydronephrosis or perinephric fat stranding. Small peripelvic cysts are seen on the right. There is right renal cortical scarring. Adrenal glands and bladder are within normal limits. Stomach/Bowel: Stomach is within normal limits. Appendix appears normal. No evidence of bowel wall thickening, distention, or inflammatory changes. There is colonic diverticulosis without evidence for acute diverticulitis. Vascular/Lymphatic: Aortic atherosclerosis. No enlarged abdominal or pelvic lymph nodes. Reproductive: Prostate is unremarkable. Other: There are small fat containing inguinal hernias.  No ascites. Musculoskeletal: Healed sacral fracture. No acute fractures are seen. IMPRESSION: 1. No acute process in the abdomen or pelvis. 2. Colonic diverticulosis. 3. Fat containing inguinal hernias. 4. Fatty infiltration of the liver. 5.  Aortic Atherosclerosis (ICD10-I70.0). Electronically Signed   By: Darliss Cheney M.D.   On: 02/15/2022 20:42   ECHOCARDIOGRAM COMPLETE  Result Date: 02/16/2022    ECHOCARDIOGRAM REPORT   Patient Name:   Seth Lewis Date of Exam: 02/16/2022 Medical Rec #:  [id number]        Height:       73.0 in Accession #:    [id number]       Weight:       219.0 lb Date of Birth:  [birth year]        BSA:          2.236 m Patient Age:    76 years         BP:           139/75 mmHg Patient Gender: M                HR:           54 bpm. Exam Location:  Inpatient Procedure: 2D Echo, Cardiac Doppler and Color Doppler Indications:    Chest pain  History:        Patient has no prior history of Echocardiogram examinations.                 Risk Factors:Hypertension, Diabetes and Sleep Apnea.  Sonographer:    Cleatis Polka Referring Phys:  [id number] ANASTASSIA DOUTOVA IMPRESSIONS  1. Left ventricular ejection fraction, by estimation, is 60 to 65%. The left ventricle has normal function. The left ventricle has no regional wall motion  abnormalities. Left ventricular diastolic parameters were normal.  2. Right ventricular systolic function is normal. The right ventricular size is normal.  3. The mitral valve is normal in structure. No evidence of mitral valve regurgitation. No evidence of mitral stenosis.  4. The aortic valve is normal in structure. Aortic valve regurgitation is not visualized. No aortic stenosis is present. FINDINGS  Left Ventricle: Left ventricular ejection fraction, by estimation, is 60 to 65%. The left ventricle has normal function. The left ventricle has no regional wall motion abnormalities. The left ventricular internal cavity size was normal in size. There is  no left ventricular hypertrophy. Left ventricular diastolic parameters were normal. Right Ventricle: The right ventricular size is normal. No increase in right ventricular wall thickness. Right ventricular systolic function is normal. Left Atrium: Left atrial size was normal in size. Right Atrium: Right atrial size was normal in size. Pericardium: There is no evidence of pericardial effusion. Mitral Valve: The mitral valve is normal in structure. No evidence of mitral valve regurgitation. No evidence of mitral valve stenosis. Tricuspid Valve: The tricuspid valve is normal in structure. Tricuspid valve regurgitation is mild . No evidence of tricuspid stenosis. Aortic Valve: The aortic valve is normal in structure. Aortic valve regurgitation is not visualized. No aortic stenosis is present. Aortic valve peak gradient measures 9.7 mmHg. Pulmonic Valve: The pulmonic valve was normal in structure. Pulmonic valve regurgitation is not visualized. No evidence of pulmonic stenosis. Aorta: The aortic root is normal in size and structure. IAS/Shunts: The interatrial septum was not  assessed.  LEFT VENTRICLE PLAX 2D LVIDd:         4.70 cm      Diastology LVIDs:         3.10 cm      LV e' medial:    6.09 cm/s LV PW:         1.00 cm      LV E/e' medial:  8.7 LV IVS:        1.00 cm      LV e' lateral:   8.05 cm/s LVOT diam:     2.00 cm      LV E/e' lateral: 6.6 LV SV:         62 LV SV Index:   28 LVOT Area:     3.14 cm  LV Volumes (MOD) LV vol d, MOD A2C: 110.0 ml LV vol d, MOD A4C: 90.2 ml LV vol s, MOD A2C: 43.0 ml LV vol s, MOD A4C: 37.3 ml LV SV MOD A2C:     67.0 ml LV SV MOD A4C:     90.2 ml LV SV MOD BP:      58.4 ml RIGHT VENTRICLE             IVC RV Basal diam:  3.90 cm     IVC diam: 1.40 cm RV Mid diam:    3.50 cm RV S prime:     10.80 cm/s TAPSE (M-mode): 2.4 cm LEFT ATRIUM             Index        RIGHT ATRIUM           Index LA diam:        3.60 cm 1.61 cm/m   RA Area:     17.40 cm LA Vol (A2C):   34.1 ml 15.25 ml/m  RA Volume:   49.20 ml  22.00 ml/m LA Vol (A4C):   44.4 ml 19.85 ml/m LA Biplane  Vol: 40.2 ml 17.98 ml/m  AORTIC VALVE AV Area (Vmax): 2.01 cm AV Vmax:        156.00 cm/s AV Peak Grad:   9.7 mmHg LVOT Vmax:      100.00 cm/s LVOT Vmean:     65.900 cm/s LVOT VTI:       0.198 m  AORTA Ao Root diam: 3.30 cm Ao Asc diam:  3.60 cm MITRAL VALVE MV Area (PHT): 3.36 cm    SHUNTS MV Decel Time: 226 msec    Systemic VTI:  0.20 m MV E velocity: 52.80 cm/s  Systemic Diam: 2.00 cm MV A velocity: 68.00 cm/s MV E/A ratio:  0.78 Manish Patwardhan MD Electronically signed by Truett Mainland MD Signature Date/Time: 02/16/2022/2:29:36 PM    Final     Microbiology: Results for orders placed or performed during the hospital encounter of 08/16/19  SARS CORONAVIRUS 2 (TAT 6-24 HRS) Nasopharyngeal Nasopharyngeal Swab     Status: None   Collection Time: 08/16/19  1:41 PM   Specimen: Nasopharyngeal Swab  Result Value Ref Range Status   SARS Coronavirus 2 NEGATIVE NEGATIVE Final    Comment: (NOTE) SARS-CoV-2 target nucleic acids are NOT DETECTED. The SARS-CoV-2 RNA is generally  detectable in upper and lower respiratory specimens during the acute phase of infection. Negative results do not preclude SARS-CoV-2 infection, do not rule out co-infections with other pathogens, and should not be used as the sole basis for treatment or other patient management decisions. Negative results must be combined with clinical observations, patient history, and epidemiological information. The expected result is Negative. Fact Sheet for Patients: HairSlick.no Fact Sheet for Healthcare Providers: quierodirigir.com This test is not yet approved or cleared by the [place] FDA and  has been authorized for detection and/or diagnosis of SARS-CoV-2 by FDA under an Emergency Use Authorization (EUA). This EUA will remain  in effect (meaning this test can be used) for the duration of the COVID-19 declaration under Section 56 4(b)(1) of the Act, 21 U.S.C. section 360bbb-3(b)(1), unless the authorization is terminated or revoked sooner. Performed at [facility], [address] [place], [place] [address]     Labs: CBC: Recent Labs  Lab 02/15/22 1440 02/16/22 0400  WBC 6.3 7.0  HGB 13.6 12.8*  HCT 43.7 39.6  MCV 95.4 94.7  PLT 316 271   Basic Metabolic Panel: Recent Labs  Lab 02/15/22 1440 02/15/22 2253 02/16/22 0400  NA 138  --  138  K 4.3  --  4.0  CL 103  --  102  CO2 28  --  29  GLUCOSE 132*  --  134*  BUN 16  --  15  CREATININE 1.34*  --  1.34*  CALCIUM 9.9  --  8.9  PHOS  --  3.2  --    Liver Function Tests: Recent Labs  Lab 02/15/22 1647 02/16/22 0400  AST 38 37  ALT 41 37  ALKPHOS 86 80  BILITOT 0.3 0.4  PROT 7.9 7.0  ALBUMIN 3.7 3.3*   CBG: Recent Labs  Lab 02/16/22 0005 02/16/22 0405 02/16/22 0410 02/16/22 0818  GLUCAP 177* <10* 107* 143*    Discharge time spent: approximately 40 minutes spent on discharge counseling, evaluation of patient on day of discharge, and  coordination of discharge planning with nursing, social work, pharmacy and case management  Signed: Alberteen Sam, MD [facility] 02/16/2022
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
## 2022-02-16 NOTE — Progress Notes (Signed)
  Transition of Care [facility]) Screening Note   Patient Details  Name: Seth Lewis Date of Birth: May 30, 1946   [facility] of [facility]) CM/SW Contact:    Erin Sons, LCSW Phone Number: 02/16/2022, 3:19 PM    Transition of Care Department [facility]) has reviewed patient and no TOC needs have been identified at this time. We will continue to monitor patient advancement through interdisciplinary progression rounds. If new patient transition needs arise, please place a TOC consult.

## 2022-02-16 NOTE — Progress Notes (Signed)
Patient refused to wear CPAP. Stating he will be leaving in AM. RT informed him to call if he changes his mind.

## 2022-02-16 NOTE — ED Notes (Signed)
Rt was called to set up pts cpap and pt refused.

## 2022-02-21 LAB — GLUCOSE, CAPILLARY: Glucose-Capillary: <10 mg/dL — CL (ref 70–99)

## 2022-03-02 ENCOUNTER — Ambulatory Visit: Payer: Medicare Other

## 2022-03-02 DIAGNOSIS — R072 Precordial pain: Secondary | ICD-10-CM

## 2022-03-06 LAB — PCV MYOCARDIAL PERFUSION WITH LEXISCAN: ST Depression (mm): 0 mm

## 2022-03-08 NOTE — Progress Notes (Unsigned)
Follow up visit  Subjective:   Seth Lewis, male    DOB: 1946-06-14, 76 y.o.   MRN: [id number]    *** HPI  No chief complaint on file.   76 y.o. *** male with ***  ***  ***  Current Outpatient Medications:    ANDROGEL PUMP 20.25 MG/ACT (1.62%) GEL, Apply 4 Squirts topically every other day. To shoulders, Disp: , Rfl: 2   aspirin EC 81 MG tablet, Take 81 mg by mouth at bedtime., Disp: , Rfl:    Calcium Carb-Cholecalciferol (CALCIUM 500/D PO), Take 1 tablet by mouth daily., Disp: , Rfl:    Cholecalciferol (VITAMIN D3) 50 MCG (2000 UT) TABS, Take 2,000 Units by mouth daily., Disp: , Rfl:    Magnesium Oxide 420 MG TABS, Take 420 mg by mouth at bedtime., Disp: , Rfl:    metFORMIN (GLUCOPHAGE-XR) 500 MG 24 hr tablet, Take 500 mg by mouth 2 (two) times daily., Disp: , Rfl:    metoprolol (LOPRESSOR) 100 MG tablet, Take 50 mg by mouth 2 (two) times daily. , Disp: , Rfl:    mirtazapine (REMERON) 15 MG tablet, Take 15 mg by mouth at bedtime., Disp: , Rfl:    Multiple Vitamin (MULTIVITAMIN WITH MINERALS) TABS tablet, Take 1 tablet by mouth daily., Disp: , Rfl:    NIFEdipine (PROCARDIA XL/ADALAT-CC) 60 MG 24 hr tablet, Take 60 mg by mouth daily., Disp: , Rfl:    nitroGLYCERIN (NITROSTAT) 0.4 MG SL tablet, Place 1 tablet (0.4 mg total) under the tongue every 5 (five) minutes as needed for chest pain., Disp: 15 tablet, Rfl: 12   NON FORMULARY, CPAP at bedtime, Disp: , Rfl:    pantoprazole (PROTONIX) 40 MG tablet, Take 1 tablet (40 mg total) by mouth daily., Disp: 30 tablet, Rfl: 3   prazosin (MINIPRESS) 2 MG capsule, Take 2 mg by mouth at bedtime., Disp: , Rfl:    simvastatin (ZOCOR) 80 MG tablet, Take 40 mg by mouth daily., Disp: , Rfl: 3   venlafaxine XR (EFFEXOR-XR) 75 MG 24 hr capsule, Take 225 mg by mouth daily with breakfast., Disp: , Rfl:    Cardiovascular & other pertient studies:  Reviewed external labs and tests, independently interpreted  *** EKG  ***/***/202***: ***  Lexiscan Nuclear stress test 03/02/2022: Nondiagnostic ECG stress. The heart rate response was consistent with Regadenoson.  Myocardial perfusion is normal. Overall LV systolic function is normal without regional wall motion abnormalities. Stress LV EF: 65%.  No previous exam available for comparison. Low risk.      Echocardiogram 02/16/2022: 1. Left ventricular ejection fraction, by estimation, is 60 to 65%. The  left ventricle has normal function. The left ventricle has no regional  wall motion abnormalities. Left ventricular diastolic parameters were  normal.   2. Right ventricular systolic function is normal. The right ventricular  size is normal.   3. The mitral valve is normal in structure. No evidence of mitral valve  regurgitation. No evidence of mitral stenosis.   4. The aortic valve is normal in structure. Aortic valve regurgitation is  not visualized. No aortic stenosis is present.   Recent labs: 02/16/2022: Glucose 134, BUN/Cr 15/1.34. EGFR 55. Na/K 138/4.0. Rest of the CMP normal H/H 12/39. MCV 94. Platelets 271 HbA1C 7.1% TSH 1.9 normal   *** ROS      *** There were no vitals filed for this visit.  There is no height or weight on file to calculate BMI. There were no vitals filed for this  visit.  *** Objective:   Physical Exam          Visit diagnoses: No diagnosis found.   No orders of the defined types were placed in this encounter.    Medication changes this visit: There are no discontinued medications.  No orders of the defined types were placed in this encounter.    Assessment & Recommendations:   ***       Nigel Mormon, MD Pager: [phone] Office: [phone]

## 2022-03-09 ENCOUNTER — Encounter: Payer: Self-pay | Admitting: Cardiology

## 2022-03-09 ENCOUNTER — Ambulatory Visit: Payer: Medicare Other | Admitting: Cardiology

## 2022-03-09 VITALS — BP 116/71 | HR 60 | Temp 97.8°F | Resp 17 | Ht 73.0 in | Wt 227.2 lb

## 2022-03-09 DIAGNOSIS — I10 Essential (primary) hypertension: Secondary | ICD-10-CM

## 2022-04-13 ENCOUNTER — Ambulatory Visit
Admission: RE | Admit: 2022-04-13 | Discharge: 2022-04-13 | Disposition: A | Discharge status: Home / Self Care | Payer: Medicare Other | Source: Ambulatory Visit | Attending: Cardiology | Admitting: Cardiology

## 2022-04-13 DIAGNOSIS — I10 Essential (primary) hypertension: Secondary | ICD-10-CM

## 2022-05-02 ENCOUNTER — Other Ambulatory Visit: Payer: Medicare Other

## 2023-01-05 ENCOUNTER — Other Ambulatory Visit: Payer: Self-pay | Admitting: Neurosurgery

## 2023-01-09 NOTE — Pre-Procedure Instructions (Addendum)
Surgical Instructions    Your procedure is scheduled on Friday, January 13, 2023 at 8:00 AM.  Report to [facility] Main Entrance "A" at 6:00 A.M., then check in with the Admitting office.  Call this number if you have problems the morning of surgery:  [phone]   If you have any questions prior to your surgery date call [phone]: Open Monday-Friday 8am-4pm  *If you experience any cold or flu symptoms such as cough, fever, chills, shortness of breath, etc. between now and your scheduled surgery, please notify us.*    Remember:  Do not eat or drink after midnight the night before your surgery    Take these medicines the morning of surgery with A SIP OF WATER:  cycloSPORINE (RESTASIS)  metoprolol (LOPRESSOR)  NIFEdipine (PROCARDIA XL/ADALAT-CC)  pantoprazole (PROTONIX)  prazosin (MINIPRESS)  simvastatin (ZOCOR)  venlafaxine XR (EFFEXOR-XR)  nitroGLYCERIN (NITROSTAT) - if needed   Follow your surgeon's instructions on when to stop Aspirin.  If no instructions were given by your surgeon then you will need to call the office to get those instructions.    As of today, STOP taking any Aleve, Naproxen, Ibuprofen, Motrin, Advil, Goody's, BC's, all herbal medications, fish oil, and all vitamins.  WHAT DO I DO ABOUT MY DIABETES MEDICATION?   Do not take metFORMIN (GLUCOPHAGE-XR)  the morning of surgery.   HOW TO MANAGE YOUR DIABETES BEFORE AND AFTER SURGERY  Why is it important to control my blood sugar before and after surgery? Improving blood sugar levels before and after surgery helps healing and can limit problems. A way of improving blood sugar control is eating a healthy diet by:  Eating less sugar and carbohydrates  Increasing activity/exercise  Talking with your doctor about reaching your blood sugar goals High blood sugars (greater than 180 mg/dL) can raise your risk of infections and slow your recovery, so you will need to focus on controlling your diabetes during  the weeks before surgery. Make sure that the doctor who takes care of your diabetes knows about your planned surgery including the date and location.  How do I manage my blood sugar before surgery? Check your blood sugar at least 4 times a day, starting 2 days before surgery, to make sure that the level is not too high or low.  Check your blood sugar the morning of your surgery when you wake up and every 2 hours until you get to the Short Stay unit.  If your blood sugar is less than 70 mg/dL, you will need to treat for low blood sugar: Do not take insulin. Treat a low blood sugar (less than 70 mg/dL) with  cup of clear juice (cranberry or apple), 4 glucose tablets, OR glucose gel. Recheck blood sugar in 15 minutes after treatment (to make sure it is greater than 70 mg/dL). If your blood sugar is not greater than 70 mg/dL on recheck, call [phone] for further instructions. Report your blood sugar to the short stay nurse when you get to Short Stay.  If you are admitted to the hospital after surgery: Your blood sugar will be checked by the staff and you will probably be given insulin after surgery (instead of oral diabetes medicines) to make sure you have good blood sugar levels. The goal for blood sugar control after surgery is 80-180 mg/dL.                   Do NOT Smoke (Tobacco/Vaping) for 24 hours prior to your procedure.  If you use a CPAP at night, you may bring your mask/headgear for your overnight stay.   Contacts, glasses, piercing's, hearing aid's, dentures or partials may not be worn into surgery, please bring cases for these belongings.    For patients admitted to the hospital, discharge time will be determined by your treatment team.   Patients discharged the day of surgery will not be allowed to drive home, and someone needs to stay with them for 24 hours.  SURGICAL WAITING ROOM VISITATION Patients having surgery or a procedure may have 2 support people in the waiting  area. Visitors may stay in the waiting area during the procedure and switch out with other visitors if needed. Only 1 support person is allowed in the pre-op area with the patient AFTER the patient is prepped. This person cannot be switched out.  Children under the age of 16 must have an adult accompany them who is not the patient. If the patient needs to stay at the hospital during part of their recovery, the visitor guidelines for inpatient rooms apply.  Please refer to the [facility] website for the visitor guidelines for Inpatients (after your surgery is over and you are in a regular room).    Oral Hygiene is also important to reduce your risk of infection.  Remember - BRUSH YOUR TEETH THE MORNING OF SURGERY WITH YOUR REGULAR TOOTHPASTE   Day of Surgery: Take a shower with CHG soap. Do not wear lotions, powders, perfumes/colognes, or deodorant. Do not wear jewelry or makeup Do not shave 48 hours prior to surgery.  Men may shave face and neck. Do not wear nail polish, gel polish, artificial nails, or any other type of covering on natural nails (fingers and toes) If you have artificial nails or gel coating that need to be removed by a nail salon, please have this removed prior to surgery. Artificial nails or gel coating may interfere with anesthesia's ability to adequately monitor your vital signs. Wear Clean/Comfortable clothing the morning of surgery Do not bring valuables to the hospital.  [facility] is not responsible for any belongings or valuables. Remember to brush your teeth WITH YOUR REGULAR TOOTHPASTE.   Please read over the following fact sheets that you were given.  If you received a COVID test during your pre-op visit  it is requested that you wear a mask when out in public, stay away from anyone that may not be feeling well and notify your surgeon if you develop symptoms. If you have been in contact with anyone that has tested positive in the last 10 days please notify you  surgeon.

## 2023-01-10 ENCOUNTER — Encounter (HOSPITAL_COMMUNITY): Payer: Self-pay

## 2023-01-10 ENCOUNTER — Other Ambulatory Visit: Payer: Self-pay

## 2023-01-10 ENCOUNTER — Encounter (HOSPITAL_COMMUNITY)
Admission: RE | Admit: 2023-01-10 | Discharge: 2023-01-10 | Disposition: A | Discharge status: Home / Self Care | Payer: Medicare Other | Source: Ambulatory Visit | Attending: Neurosurgery | Admitting: Neurosurgery

## 2023-01-10 VITALS — BP 126/68 | HR 56 | Temp 97.9°F | Resp 18 | Ht 73.0 in | Wt 214.8 lb

## 2023-01-10 DIAGNOSIS — Z981 Arthrodesis status: Secondary | ICD-10-CM | POA: Diagnosis not present

## 2023-01-10 DIAGNOSIS — Z01818 Encounter for other preprocedural examination: Secondary | ICD-10-CM

## 2023-01-10 DIAGNOSIS — Z01812 Encounter for preprocedural laboratory examination: Secondary | ICD-10-CM | POA: Insufficient documentation

## 2023-01-10 DIAGNOSIS — G4733 Obstructive sleep apnea (adult) (pediatric): Secondary | ICD-10-CM | POA: Insufficient documentation

## 2023-01-10 DIAGNOSIS — E119 Type 2 diabetes mellitus without complications: Secondary | ICD-10-CM | POA: Diagnosis not present

## 2023-01-10 LAB — CBC
HCT: 48.5 % (ref 39.0–52.0)
Hemoglobin: 14.9 g/dL (ref 13.0–17.0)
MCH: 30.1 pg (ref 26.0–34.0)
MCHC: 30.7 g/dL (ref 30.0–36.0)
MCV: 98 fL (ref 80.0–100.0)
Platelets: 251 10*3/uL (ref 150–400)
RBC: 4.95 MIL/uL (ref 4.22–5.81)
RDW: 15.1 % (ref 11.5–15.5)
WBC: 9.3 10*3/uL (ref 4.0–10.5)
nRBC: 0 % (ref 0.0–0.2)

## 2023-01-10 LAB — GLUCOSE, CAPILLARY: Glucose-Capillary: 170 mg/dL — ABNORMAL HIGH (ref 70–99)

## 2023-01-10 LAB — SURGICAL PCR SCREEN
MRSA, PCR: NEGATIVE
Staphylococcus aureus: NEGATIVE

## 2023-01-10 LAB — BASIC METABOLIC PANEL
Anion gap: 11 (ref 5–15)
BUN: 20 mg/dL (ref 8–23)
CO2: 27 mmol/L (ref 22–32)
Calcium: 8.8 mg/dL — ABNORMAL LOW (ref 8.9–10.3)
Chloride: 102 mmol/L (ref 98–111)
Creatinine, Ser: 1.45 mg/dL — ABNORMAL HIGH (ref 0.61–1.24)
GFR, Estimated: 50 mL/min — ABNORMAL LOW (ref >60)
Glucose, Bld: 155 mg/dL — ABNORMAL HIGH (ref 70–99)
Potassium: 4.4 mmol/L (ref 3.5–5.1)
Sodium: 140 mmol/L (ref 135–145)

## 2023-01-10 LAB — HEMOGLOBIN A1C
Hgb A1c MFr Bld: 7.1 % — ABNORMAL HIGH (ref 4.8–5.6)
Mean Plasma Glucose: 157.07 mg/dL

## 2023-01-10 NOTE — Progress Notes (Signed)
PCP - Dr. Blair Heys Cardiologist - Dr. Truett Mainland  PPM/ICD - Denies  Chest x-ray - N/A EKG - 03/09/22 Stress Test - 03/06/22 ECHO - 02/16/22 Cardiac Cath - Denies  Sleep Study - OSA CPAP - Yes  Patient has Type II Diabetes; does not check blood sugars.  Blood Thinner Instructions: N/A Aspirin Instructions: Follow surgeons instructions.  ERAS Protcol - No  COVID TEST- N/A   Anesthesia review: Yes, cardiac hx  Patient denies shortness of breath, fever, cough and chest pain at PAT appointment   All instructions explained to the patient, with a verbal understanding of the material. Patient agrees to go over the instructions while at home for a better understanding. Patient also instructed to self quarantine after being tested for COVID-19. The opportunity to ask questions was provided.

## 2023-01-11 NOTE — Progress Notes (Signed)
Anesthesia Chart Review:  Patient evaluated in May 2023 for episode of chest pain.  Last seen by cardiologist Dr. Rosemary Holms 03/09/2022.  Per note, "atient was seen in [facility] on 02/16/2022 with complaints of chest pain. ACS was excluded. Echocardiogram showed normal EF and wall motion. Patient subsequently underwent nuclear stress testing in our office that showed no ischemia. Patient has not had any recurrent chest pain episodes. He denies any other complaints today."  He was recommended to follow-up with cardiology on an as-needed basis.  OSA on CPAP.  History of C5-6 ACDF.  History of difficult intubation secondary to anterior larynx.  GlideScope has been used electively.  Non-insulin-dependent DM2, A1c 7.1 on preop labs.  Preop labs reviewed, creatinine mildly elevated at 1.54, otherwise unremarkable.  EKG 03/09/2022: Sinus bradycardia.  Rate 51.  Lexiscan Nuclear stress test 03/02/2022: Nondiagnostic ECG stress. The heart rate response was consistent with Regadenoson.  Myocardial perfusion is normal. Overall LV systolic function is normal without regional wall motion abnormalities. Stress LV EF: 65%.  No previous exam available for comparison. Low risk.     Echocardiogram 02/16/2022: 1. Left ventricular ejection fraction, by estimation, is 60 to 65%. The  left ventricle has normal function. The left ventricle has no regional  wall motion abnormalities. Left ventricular diastolic parameters were  normal.   2. Right ventricular systolic function is normal. The right ventricular  size is normal.   3. The mitral valve is normal in structure. No evidence of mitral valve  regurgitation. No evidence of mitral stenosis.   4. The aortic valve is normal in structure. Aortic valve regurgitation is  not visualized. No aortic stenosis is present.     Zannie Cove [facility] Short Stay Center/Anesthesiology Phone [phone] 01/11/2023 11:04 AM

## 2023-01-11 NOTE — Anesthesia Preprocedure Evaluation (Addendum)
Anesthesia Evaluation  Patient identified by MRN, date of birth, ID band Patient awake    Reviewed: Allergy & Precautions, H&P , NPO status , Patient's Chart, lab work & pertinent test results  Airway Mallampati: II   Neck ROM: full    Dental   Pulmonary sleep apnea , former smoker   breath sounds clear to auscultation       Cardiovascular hypertension, + dysrhythmias  Rhythm:regular Rate:Normal     Neuro/Psych  PSYCHIATRIC DISORDERS Anxiety        GI/Hepatic ,GERD  ,,  Endo/Other  diabetes, Type 2    Renal/GU      Musculoskeletal  (+) Arthritis ,    Abdominal   Peds  Hematology   Anesthesia Other Findings   Reproductive/Obstetrics                             Anesthesia Physical Anesthesia Plan  ASA: 3  Anesthesia Plan: General   Post-op Pain Management:    Induction: Intravenous  PONV Risk Score and Plan: 2 and Ondansetron, Dexamethasone and Treatment may vary due to age or medical condition  Airway Management Planned: Oral ETT and Video Laryngoscope Planned  Additional Equipment:   Intra-op Plan:   Post-operative Plan: Extubation in OR  Informed Consent: I have reviewed the patients History and Physical, chart, labs and discussed the procedure including the risks, benefits and alternatives for the proposed anesthesia with the patient or authorized representative who has indicated his/her understanding and acceptance.     Dental advisory given  Plan Discussed with: CRNA, Anesthesiologist and Surgeon  Anesthesia Plan Comments: (PAT note by Antionette Poles, PA-C: Patient evaluated in May 2023 for episode of chest pain.  Last seen by cardiologist Dr. Rosemary Holms 03/09/2022.  Per note, "atient was seen in [facility] on 02/16/2022 with complaints of chest pain. ACS was excluded. Echocardiogram showed normal EF and wall motion. Patient subsequently underwent nuclear stress  testing in our office that showed no ischemia. Patient has not had any recurrent chest pain episodes. He denies any other complaints today."  He was recommended to follow-up with cardiology on an as-needed basis.  OSA on CPAP.  History of C5-6 ACDF.  History of difficult intubation secondary to anterior larynx.  GlideScope has been used electively.  Non-insulin-dependent DM2, A1c 7.1 on preop labs.  Preop labs reviewed, creatinine mildly elevated at 1.54, otherwise unremarkable.  EKG 03/09/2022: Sinus bradycardia.  Rate 51.  Lexiscan Nuclear stress test6/14/2023: Nondiagnostic ECG stress. The heart rate response was consistent with Regadenoson.  Myocardial perfusion is normal. Overall LV systolic function is normal without regional wall motion abnormalities. Stress LV EF: 65%.  No previous exam available for comparison. Low risk.   Echocardiogram 02/16/2022: 1. Left ventricular ejection fraction, by estimation, is 60 to 65%. The  left ventricle has normal function. The left ventricle has no regional  wall motion abnormalities. Left ventricular diastolic parameters were  normal.  2. Right ventricular systolic function is normal. The right ventricular  size is normal.  3. The mitral valve is normal in structure. No evidence of mitral valve  regurgitation. No evidence of mitral stenosis.  4. The aortic valve is normal in structure. Aortic valve regurgitation is  not visualized. No aortic stenosis is present.    )        Anesthesia Quick Evaluation

## 2023-01-13 ENCOUNTER — Other Ambulatory Visit: Payer: Self-pay

## 2023-01-13 ENCOUNTER — Ambulatory Visit (HOSPITAL_COMMUNITY)
Admission: RE | Disposition: A | Discharge status: Home / Self Care | Payer: Self-pay | Source: Home / Self Care | Attending: Neurosurgery

## 2023-01-13 ENCOUNTER — Ambulatory Visit (HOSPITAL_BASED_OUTPATIENT_CLINIC_OR_DEPARTMENT_OTHER): Payer: No Typology Code available for payment source | Admitting: Certified Registered"

## 2023-01-13 ENCOUNTER — Encounter (HOSPITAL_COMMUNITY): Payer: Self-pay | Admitting: Neurosurgery

## 2023-01-13 ENCOUNTER — Ambulatory Visit (HOSPITAL_COMMUNITY): Payer: No Typology Code available for payment source | Admitting: Physician Assistant

## 2023-01-13 ENCOUNTER — Ambulatory Visit (HOSPITAL_COMMUNITY): Payer: No Typology Code available for payment source

## 2023-01-13 ENCOUNTER — Observation Stay (HOSPITAL_COMMUNITY)
Admission: RE | Admit: 2023-01-13 | Discharge: 2023-01-14 | Disposition: A | Discharge status: Home / Self Care | Payer: No Typology Code available for payment source | Attending: Neurosurgery | Admitting: Neurosurgery

## 2023-01-13 DIAGNOSIS — Z7982 Long term (current) use of aspirin: Secondary | ICD-10-CM | POA: Insufficient documentation

## 2023-01-13 DIAGNOSIS — M4802 Spinal stenosis, cervical region: Secondary | ICD-10-CM | POA: Diagnosis not present

## 2023-01-13 DIAGNOSIS — Z87891 Personal history of nicotine dependence: Secondary | ICD-10-CM | POA: Insufficient documentation

## 2023-01-13 DIAGNOSIS — M5011 Cervical disc disorder with radiculopathy,  high cervical region: Secondary | ICD-10-CM | POA: Diagnosis not present

## 2023-01-13 DIAGNOSIS — Z7984 Long term (current) use of oral hypoglycemic drugs: Secondary | ICD-10-CM | POA: Insufficient documentation

## 2023-01-13 DIAGNOSIS — E119 Type 2 diabetes mellitus without complications: Secondary | ICD-10-CM

## 2023-01-13 DIAGNOSIS — Z96612 Presence of left artificial shoulder joint: Secondary | ICD-10-CM | POA: Diagnosis not present

## 2023-01-13 DIAGNOSIS — M5001 Cervical disc disorder with myelopathy,  high cervical region: Principal | ICD-10-CM | POA: Insufficient documentation

## 2023-01-13 DIAGNOSIS — M5412 Radiculopathy, cervical region: Secondary | ICD-10-CM | POA: Diagnosis not present

## 2023-01-13 DIAGNOSIS — I10 Essential (primary) hypertension: Secondary | ICD-10-CM

## 2023-01-13 DIAGNOSIS — Z79899 Other long term (current) drug therapy: Secondary | ICD-10-CM | POA: Insufficient documentation

## 2023-01-13 DIAGNOSIS — M4712 Other spondylosis with myelopathy, cervical region: Secondary | ICD-10-CM | POA: Diagnosis present

## 2023-01-13 HISTORY — PX: ANTERIOR CERVICAL DECOMP/DISCECTOMY FUSION: SHX1161

## 2023-01-13 LAB — GLUCOSE, CAPILLARY
Glucose-Capillary: 181 mg/dL — ABNORMAL HIGH (ref 70–99)
Glucose-Capillary: 149 mg/dL — ABNORMAL HIGH (ref 70–99)
Glucose-Capillary: 176 mg/dL — ABNORMAL HIGH (ref 70–99)
Glucose-Capillary: 243 mg/dL — ABNORMAL HIGH (ref 70–99)
Glucose-Capillary: 235 mg/dL — ABNORMAL HIGH (ref 70–99)
Glucose-Capillary: 275 mg/dL — ABNORMAL HIGH (ref 70–99)

## 2023-01-13 SURGERY — ANTERIOR CERVICAL DECOMPRESSION/DISCECTOMY FUSION 1 LEVEL
Anesthesia: General

## 2023-01-13 MED ORDER — ATORVASTATIN CALCIUM 40 MG PO TABS
40.0000 mg | ORAL_TABLET | Freq: Every day | ORAL | Status: DC
  Administered 2023-01-13: 40 mg via ORAL
  Filled 2023-01-13: qty 1

## 2023-01-13 MED ORDER — CHLORHEXIDINE GLUCONATE 0.12 % MT SOLN
15.0000 mL | Freq: Once | OROMUCOSAL | Status: AC
  Administered 2023-01-13: 15 mL via OROMUCOSAL
  Filled 2023-01-13: qty 15

## 2023-01-13 MED ORDER — NITROGLYCERIN 0.4 MG SL SUBL: 0.4000 mg | SUBLINGUAL_TABLET | SUBLINGUAL | Status: DC | PRN

## 2023-01-13 MED ORDER — INSULIN ASPART 100 UNIT/ML IJ SOLN
0.0000 [IU] | Freq: Three times a day (TID) | INTRAMUSCULAR | Status: DC
  Administered 2023-01-14: 3 [IU] via SUBCUTANEOUS

## 2023-01-13 MED ORDER — OYSTER SHELL CALCIUM/D3 500-5 MG-MCG PO TABS
1.0000 | ORAL_TABLET | Freq: Every day | ORAL | Status: DC
  Administered 2023-01-14: 1 via ORAL
  Filled 2023-01-13: qty 1

## 2023-01-13 MED ORDER — HYDROMORPHONE HCL 1 MG/ML IJ SOLN: 1.0000 mg | INTRAMUSCULAR | Status: DC | PRN

## 2023-01-13 MED ORDER — ONDANSETRON HCL 4 MG/2ML IJ SOLN: 4.0000 mg | Freq: Four times a day (QID) | INTRAMUSCULAR | Status: DC | PRN

## 2023-01-13 MED ORDER — SODIUM CHLORIDE 0.9 % IV SOLN
250.0000 mL | INTRAVENOUS | Status: DC
  Administered 2023-01-13: 250 mL via INTRAVENOUS

## 2023-01-13 MED ORDER — THROMBIN (RECOMBINANT) 5000 UNITS EX SOLR
CUTANEOUS | Status: DC | PRN
  Administered 2023-01-13: 10 mL via TOPICAL

## 2023-01-13 MED ORDER — PHENYLEPHRINE HCL-NACL 20-0.9 MG/250ML-% IV SOLN
INTRAVENOUS | Status: DC | PRN
  Administered 2023-01-13: 55 ug/min via INTRAVENOUS

## 2023-01-13 MED ORDER — CYCLOBENZAPRINE HCL 10 MG PO TABS
10.0000 mg | ORAL_TABLET | Freq: Three times a day (TID) | ORAL | Status: DC | PRN
  Administered 2023-01-13 – 2023-01-14 (×3): 10 mg via ORAL
  Filled 2023-01-13 (×3): qty 1

## 2023-01-13 MED ORDER — NIFEDIPINE ER OSMOTIC RELEASE 60 MG PO TB24
60.0000 mg | ORAL_TABLET | Freq: Every day | ORAL | Status: DC
  Filled 2023-01-13: qty 1

## 2023-01-13 MED ORDER — FENTANYL CITRATE (PF) 250 MCG/5ML IJ SOLN
INTRAMUSCULAR | Status: AC
  Filled 2023-01-13: qty 5

## 2023-01-13 MED ORDER — CARBIDOPA-LEVODOPA 25-100 MG PO TABS
1.0000 | ORAL_TABLET | Freq: Three times a day (TID) | ORAL | Status: DC
  Administered 2023-01-13 (×2): 1 via ORAL
  Filled 2023-01-13 (×2): qty 1

## 2023-01-13 MED ORDER — FENTANYL CITRATE (PF) 250 MCG/5ML IJ SOLN
INTRAMUSCULAR | Status: DC | PRN
  Administered 2023-01-13: 100 ug via INTRAVENOUS

## 2023-01-13 MED ORDER — ACETAMINOPHEN 325 MG PO TABS: 650.0000 mg | ORAL_TABLET | ORAL | Status: DC | PRN

## 2023-01-13 MED ORDER — PROPOFOL 10 MG/ML IV BOLUS
INTRAVENOUS | Status: AC
  Filled 2023-01-13: qty 20

## 2023-01-13 MED ORDER — ROCURONIUM BROMIDE 10 MG/ML (PF) SYRINGE
PREFILLED_SYRINGE | INTRAVENOUS | Status: DC | PRN
  Administered 2023-01-13: 20 mg via INTRAVENOUS
  Administered 2023-01-13: 50 mg via INTRAVENOUS

## 2023-01-13 MED ORDER — INSULIN ASPART 100 UNIT/ML IJ SOLN
0.0000 [IU] | Freq: Three times a day (TID) | INTRAMUSCULAR | Status: DC
  Administered 2023-01-13: 5 [IU] via SUBCUTANEOUS

## 2023-01-13 MED ORDER — ALOGLIPTIN BENZOATE 25 MG PO TABS: 25.0000 mg | ORAL_TABLET | Freq: Every day | ORAL | Status: DC

## 2023-01-13 MED ORDER — HYDROCODONE-ACETAMINOPHEN 5-325 MG PO TABS: 1.0000 | ORAL_TABLET | ORAL | Status: DC | PRN

## 2023-01-13 MED ORDER — VENLAFAXINE HCL ER 75 MG PO CP24
225.0000 mg | ORAL_CAPSULE | Freq: Every day | ORAL | Status: DC
  Administered 2023-01-14: 225 mg via ORAL
  Filled 2023-01-13: qty 3

## 2023-01-13 MED ORDER — CALCIUM CARB-CHOLECALCIFEROL 500-10 MG-MCG PO CHEW: CHEWABLE_TABLET | Freq: Every day | ORAL | Status: DC

## 2023-01-13 MED ORDER — ORAL CARE MOUTH RINSE: 15.0000 mL | Freq: Once | OROMUCOSAL | Status: AC

## 2023-01-13 MED ORDER — OXYCODONE HCL 5 MG/5ML PO SOLN: 5.0000 mg | Freq: Once | ORAL | Status: DC | PRN

## 2023-01-13 MED ORDER — CEFAZOLIN SODIUM-DEXTROSE 2-4 GM/100ML-% IV SOLN
2.0000 g | INTRAVENOUS | Status: AC
  Administered 2023-01-13: 2 g via INTRAVENOUS
  Filled 2023-01-13: qty 100

## 2023-01-13 MED ORDER — ACETAMINOPHEN 650 MG RE SUPP: 650.0000 mg | RECTAL | Status: DC | PRN

## 2023-01-13 MED ORDER — PHENOL 1.4 % MT LIQD: 1.0000 | OROMUCOSAL | Status: DC | PRN

## 2023-01-13 MED ORDER — ADULT MULTIVITAMIN W/MINERALS CH: 1.0000 | ORAL_TABLET | Freq: Every day | ORAL | Status: DC

## 2023-01-13 MED ORDER — CHLORHEXIDINE GLUCONATE CLOTH 2 % EX PADS: 6.0000 | MEDICATED_PAD | Freq: Once | CUTANEOUS | Status: DC

## 2023-01-13 MED ORDER — THROMBIN 5000 UNITS EX SOLR
CUTANEOUS | Status: AC
  Filled 2023-01-13: qty 10000

## 2023-01-13 MED ORDER — CEFAZOLIN SODIUM-DEXTROSE 1-4 GM/50ML-% IV SOLN
1.0000 g | Freq: Three times a day (TID) | INTRAVENOUS | Status: AC
  Administered 2023-01-13 (×2): 1 g via INTRAVENOUS
  Filled 2023-01-13 (×2): qty 50

## 2023-01-13 MED ORDER — OXYCODONE HCL 5 MG PO TABS: 5.0000 mg | ORAL_TABLET | Freq: Once | ORAL | Status: DC | PRN

## 2023-01-13 MED ORDER — SODIUM CHLORIDE 0.9% FLUSH: 3.0000 mL | INTRAVENOUS | Status: DC | PRN

## 2023-01-13 MED ORDER — METFORMIN HCL ER 500 MG PO TB24: 500.0000 mg | ORAL_TABLET | Freq: Two times a day (BID) | ORAL | Status: DC

## 2023-01-13 MED ORDER — METOPROLOL TARTRATE 50 MG PO TABS: 50.0000 mg | ORAL_TABLET | Freq: Two times a day (BID) | ORAL | Status: DC

## 2023-01-13 MED ORDER — INSULIN ASPART 100 UNIT/ML IJ SOLN: 0.0000 [IU] | INTRAMUSCULAR | Status: DC | PRN

## 2023-01-13 MED ORDER — PROPOFOL 10 MG/ML IV BOLUS
INTRAVENOUS | Status: DC | PRN
  Administered 2023-01-13: 150 mg via INTRAVENOUS

## 2023-01-13 MED ORDER — LACTATED RINGERS IV SOLN: INTRAVENOUS | Status: DC

## 2023-01-13 MED ORDER — MENTHOL 3 MG MT LOZG: 1.0000 | LOZENGE | OROMUCOSAL | Status: DC | PRN

## 2023-01-13 MED ORDER — PANTOPRAZOLE SODIUM 40 MG PO TBEC: 40.0000 mg | DELAYED_RELEASE_TABLET | Freq: Every day | ORAL | Status: DC

## 2023-01-13 MED ORDER — INSULIN ASPART 100 UNIT/ML IJ SOLN
0.0000 [IU] | Freq: Every day | INTRAMUSCULAR | Status: DC
  Administered 2023-01-13: 3 [IU] via SUBCUTANEOUS

## 2023-01-13 MED ORDER — MAGNESIUM OXIDE -MG SUPPLEMENT 400 (240 MG) MG PO TABS
400.0000 mg | ORAL_TABLET | Freq: Every day | ORAL | Status: DC
  Administered 2023-01-13: 400 mg via ORAL
  Filled 2023-01-13 (×2): qty 1

## 2023-01-13 MED ORDER — FENTANYL CITRATE (PF) 100 MCG/2ML IJ SOLN
INTRAMUSCULAR | Status: AC
  Filled 2023-01-13: qty 2

## 2023-01-13 MED ORDER — EPHEDRINE SULFATE-NACL 50-0.9 MG/10ML-% IV SOSY
PREFILLED_SYRINGE | INTRAVENOUS | Status: DC | PRN
  Administered 2023-01-13: 10 mg via INTRAVENOUS
  Administered 2023-01-13 (×2): 5 mg via INTRAVENOUS

## 2023-01-13 MED ORDER — ONDANSETRON HCL 4 MG/2ML IJ SOLN
INTRAMUSCULAR | Status: DC | PRN
  Administered 2023-01-13: 4 mg via INTRAVENOUS
  Administered 2023-01-13: 30 mg via INTRAVENOUS

## 2023-01-13 MED ORDER — VITAMIN D 25 MCG (1000 UNIT) PO TABS: 2000.0000 [IU] | ORAL_TABLET | Freq: Every day | ORAL | Status: DC

## 2023-01-13 MED ORDER — DEXAMETHASONE SODIUM PHOSPHATE 10 MG/ML IJ SOLN
INTRAMUSCULAR | Status: DC | PRN
  Administered 2023-01-13: 10 mg via INTRAVENOUS

## 2023-01-13 MED ORDER — CYCLOSPORINE 0.05 % OP EMUL
1.0000 [drp] | Freq: Every day | OPHTHALMIC | Status: DC
  Administered 2023-01-13: 1 [drp] via OPHTHALMIC
  Filled 2023-01-13 (×2): qty 30

## 2023-01-13 MED ORDER — SODIUM CHLORIDE 0.9% FLUSH
3.0000 mL | Freq: Two times a day (BID) | INTRAVENOUS | Status: DC
  Administered 2023-01-13 (×2): 3 mL via INTRAVENOUS

## 2023-01-13 MED ORDER — 0.9 % SODIUM CHLORIDE (POUR BTL) OPTIME
TOPICAL | Status: DC | PRN
  Administered 2023-01-13: 1000 mL

## 2023-01-13 MED ORDER — PRAZOSIN HCL 2 MG PO CAPS
2.0000 mg | ORAL_CAPSULE | Freq: Every day | ORAL | Status: DC
  Administered 2023-01-13: 2 mg via ORAL
  Filled 2023-01-13: qty 1

## 2023-01-13 MED ORDER — EMPAGLIFLOZIN 25 MG PO TABS: 25.0000 mg | ORAL_TABLET | Freq: Every day | ORAL | Status: DC

## 2023-01-13 MED ORDER — HYDROCODONE-ACETAMINOPHEN 10-325 MG PO TABS
1.0000 | ORAL_TABLET | ORAL | Status: DC | PRN
  Administered 2023-01-13 – 2023-01-14 (×5): 1 via ORAL
  Filled 2023-01-13 (×5): qty 1

## 2023-01-13 MED ORDER — MIRTAZAPINE 15 MG PO TABS
15.0000 mg | ORAL_TABLET | Freq: Every day | ORAL | Status: DC
  Administered 2023-01-13: 15 mg via ORAL
  Filled 2023-01-13: qty 1

## 2023-01-13 MED ORDER — GLYCOPYRROLATE 0.2 MG/ML IJ SOLN
INTRAMUSCULAR | Status: DC | PRN
  Administered 2023-01-13: .1 mg via INTRAVENOUS

## 2023-01-13 MED ORDER — SUGAMMADEX SODIUM 200 MG/2ML IV SOLN
INTRAVENOUS | Status: DC | PRN
  Administered 2023-01-13: 200 mg via INTRAVENOUS

## 2023-01-13 MED ORDER — FENTANYL CITRATE (PF) 100 MCG/2ML IJ SOLN
25.0000 ug | INTRAMUSCULAR | Status: DC | PRN
  Administered 2023-01-13: 25 ug via INTRAVENOUS

## 2023-01-13 MED ORDER — ONDANSETRON HCL 4 MG PO TABS: 4.0000 mg | ORAL_TABLET | Freq: Four times a day (QID) | ORAL | Status: DC | PRN

## 2023-01-13 SURGICAL SUPPLY — 54 items
APL SKNCLS STERI-STRIP NONHPOA (GAUZE/BANDAGES/DRESSINGS) ×1
BAG COUNTER SPONGE SURGICOUNT (BAG) ×1 IMPLANT
BAG DECANTER FOR FLEXI CONT (MISCELLANEOUS) ×1 IMPLANT
BAG SPNG CNTER NS LX DISP (BAG) ×1
BAND INSRT 18 STRL LF DISP RB (MISCELLANEOUS) ×2
BAND RUBBER #18 3X1/16 STRL (MISCELLANEOUS) ×2 IMPLANT
BENZOIN TINCTURE PRP APPL 2/3 (GAUZE/BANDAGES/DRESSINGS) ×1 IMPLANT
BIT DRILL 13 (BIT) IMPLANT
BUR MATCHSTICK NEURO 3.0 LAGG (BURR) ×1 IMPLANT
CAGE PEEK 7X14X11 (Cage) ×1 IMPLANT
CANISTER SUCT 3000ML PPV (MISCELLANEOUS) ×1 IMPLANT
DRAPE C-ARM 42X72 X-RAY (DRAPES) ×2 IMPLANT
DRAPE LAPAROTOMY 100X72 PEDS (DRAPES) ×1 IMPLANT
DRAPE MICROSCOPE SLANT 54X150 (MISCELLANEOUS) ×1 IMPLANT
DURAPREP 6ML APPLICATOR 50/CS (WOUND CARE) ×1 IMPLANT
ELECT COATED BLADE 2.86 ST (ELECTRODE) ×1 IMPLANT
ELECT REM PT RETURN 9FT ADLT (ELECTROSURGICAL) ×1
ELECTRODE REM PT RTRN 9FT ADLT (ELECTROSURGICAL) ×1 IMPLANT
GAUZE 4X4 16PLY ~~LOC~~+RFID DBL (SPONGE) IMPLANT
GAUZE SPONGE 4X4 12PLY STRL (GAUZE/BANDAGES/DRESSINGS) ×1 IMPLANT
GLOVE ECLIPSE 9.0 STRL (GLOVE) ×1 IMPLANT
GLOVE EXAM NITRILE XL STR (GLOVE) IMPLANT
GOWN STRL REUS W/ TWL LRG LVL3 (GOWN DISPOSABLE) IMPLANT
GOWN STRL REUS W/ TWL XL LVL3 (GOWN DISPOSABLE) IMPLANT
GOWN STRL REUS W/TWL 2XL LVL3 (GOWN DISPOSABLE) IMPLANT
GOWN STRL REUS W/TWL LRG LVL3 (GOWN DISPOSABLE)
GOWN STRL REUS W/TWL XL LVL3 (GOWN DISPOSABLE)
HALTER HD/CHIN CERV TRACTION D (MISCELLANEOUS) ×1 IMPLANT
HEMOSTAT POWDER KIT SURGIFOAM (HEMOSTASIS) IMPLANT
KIT BASIN OR (CUSTOM PROCEDURE TRAY) ×1 IMPLANT
KIT TURNOVER KIT B (KITS) ×1 IMPLANT
NDL SPNL 20GX3.5 QUINCKE YW (NEEDLE) ×1 IMPLANT
NEEDLE SPNL 20GX3.5 QUINCKE YW (NEEDLE) ×1 IMPLANT
NS IRRIG 1000ML POUR BTL (IV SOLUTION) ×1 IMPLANT
PACK LAMINECTOMY NEURO (CUSTOM PROCEDURE TRAY) ×1 IMPLANT
PAD ARMBOARD 7.5X6 YLW CONV (MISCELLANEOUS) ×3 IMPLANT
PLATE ELITE VISION 25MM (Plate) IMPLANT
SCREW ST 13X4XST VA NS SPNE (Screw) IMPLANT
SCREW ST VAR 4 ATL (Screw) ×4 IMPLANT
SOL ELECTROSURG ANTI STICK (MISCELLANEOUS) ×1
SOLUTION ELECTROSURG ANTI STCK (MISCELLANEOUS) ×1 IMPLANT
SPACER SPNL 11X14X7XPEEK CVD (Cage) IMPLANT
SPCR SPNL 11X14X7XPEEK CVD (Cage) ×1 IMPLANT
SPONGE INTESTINAL PEANUT (DISPOSABLE) ×1 IMPLANT
SPONGE SURGIFOAM ABS GEL SZ50 (HEMOSTASIS) ×1 IMPLANT
STRIP CLOSURE SKIN 1/2X4 (GAUZE/BANDAGES/DRESSINGS) ×1 IMPLANT
SUT VIC AB 3-0 SH 8-18 (SUTURE) ×1 IMPLANT
SUT VIC AB 4-0 RB1 18 (SUTURE) ×1 IMPLANT
TAPE CLOTH 4X10 WHT NS (GAUZE/BANDAGES/DRESSINGS) ×1 IMPLANT
TAPE CLOTH SURG 6X10 WHT LF (GAUZE/BANDAGES/DRESSINGS) IMPLANT
TOWEL GREEN STERILE (TOWEL DISPOSABLE) ×1 IMPLANT
TOWEL GREEN STERILE FF (TOWEL DISPOSABLE) ×1 IMPLANT
TRAP SPECIMEN MUCUS 40CC (MISCELLANEOUS) ×1 IMPLANT
WATER STERILE IRR 1000ML POUR (IV SOLUTION) ×1 IMPLANT

## 2023-01-13 NOTE — Transfer of Care (Signed)
Immediate Anesthesia Transfer of Care Note  Patient: Seth Lewis  Procedure(s) Performed: Anterior Cervical Decompression Fusion - Cervical Three-Cervical Four  Patient Location: PACU  Anesthesia Type:General  Level of Consciousness: awake, alert , and oriented  Airway & Oxygen Therapy: Patient connected to face mask oxygen  Post-op Assessment: Post -op Vital signs reviewed and stable  Post vital signs: stable  Last Vitals:  Vitals Value Taken Time  BP 124/71 01/13/23 1009  Temp    Pulse 69 01/13/23 1014  Resp 13 01/13/23 1014  SpO2 95 % 01/13/23 1014  Vitals shown include unvalidated device data.  Last Pain:  Vitals:   01/13/23 0710  TempSrc:   PainSc: 9          Complications: No notable events documented.

## 2023-01-13 NOTE — Op Note (Signed)
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  Asst. Surgeon: Doran Durand, NP  Assistant utilized for exposure, decompression, fusion, instrumentation, and closure.  Anesthesia: General  Indication: 77 year old male remotely status post C5-C7 anterior cervical discectomy and fusion by other physician presents with worsening neck and left proximal shoulder pain failing conservative manage.  Workup demonstrates evidence of broad-based disc herniation at C3-4 with significant cord and exiting left C4 nerve root compression.  Disc bases C4-5 appears reasonably healthy.  Patient has failed conservative management presents now for C3-4 anterior cervical discectomy and fusion.  Operative note: After induction of anesthesia, patient positioned supine with neck slightly extended and held placeholder traction.  Patient's anterior cervical region prepped and draped sterilely.  Incision made overlying C3-4.  Dissection performed in the right.  Retractor placed.  Fluoroscopy used.  Level confirmed.  Disc base incised.  Discectomy performed using various instruments down to the level of the posterior annulus.  Microscope brought into the field used throughout the remainder of the discectomy.  Remaining aspects of annulus and osteophytes were removed using a high-speed drill down to the level of the posterior logical ligament.  Posterior logical ligament was elevated and resected.  Underlying thecal sac was identified.  A wide central decompression then performed undercutting the bodies of C3 and C4.  Decompression then proceeded to each neural foramina.  Wide anterior foraminotomies performed  on the course exiting C4 nerve roots bilaterally.  This point a very thorough decompression had been achieved.  There was no evidence of injury to the thecal sac and nerve roots.  Gelfoam was placed topically for hemostasis then removed.  A 7 mm [employer]onic anatomic peek cage was packed with locally harvested autograft.  This was then packed into place and recessed slightly from the anterior cortical margins of C3 and C4.  Atlantis anterior cervical plate was then placed over the C3 and C4 level.  This then attached under fluoroscopic guidance using 13 mm variable angle screws to each at both levels.  All screws given final tightening and found to be solidly within the bone.  Locking screws engaged both levels.  Final images revealed good position cages and the hardware at the proper operative level with normal alignment of spine.  Wounds irrigated.  Hemostasis assured.  Wound is then closed in layers with Vicryl sutures.  Steri-Strips and sterile dressing were applied.  No apparent complications.  Patient tolerated the procedure well and he returns to the recovery room postop.

## 2023-01-13 NOTE — Anesthesia Procedure Notes (Signed)
Procedure Name: Intubation Date/Time: 01/13/2023 8:13 AM  Performed by: Dorie Rank, CRNAPre-anesthesia Checklist: Patient identified, Emergency Drugs available, Suction available, Patient being monitored and Timeout performed Patient Re-evaluated:Patient Re-evaluated prior to induction Oxygen Delivery Method: Circle system utilized Preoxygenation: Pre-oxygenation with 100% oxygen Induction Type: IV induction Ventilation: Mask ventilation without difficulty Laryngoscope Size: Mac, 4 and Glidescope Grade View: Grade I Tube type: Oral Tube size: 7.5 mm Number of attempts: 1 Airway Equipment and Method: Video-laryngoscopy and Stylet Placement Confirmation: ETT inserted through vocal cords under direct vision, positive ETCO2 and breath sounds checked- equal and bilateral Secured at: 23 cm Tube secured with: Tape Dental Injury: Teeth and Oropharynx as per pre-operative assessment

## 2023-01-13 NOTE — Evaluation (Signed)
Occupational Therapy Evaluation Patient Details Name: Seth Lewis MRN: [id number] DOB: July 03, 1946 Today's Date: 01/13/2023   History of Present Illness 77 yo M s/p ACDF.  PMH includes: DM, HTN, OAA, R shoulder surgery, prior ACDF.   Clinical Impression   Patient admitted for the procedure above.  PTA he lives with his wife, and remains very active not needing any assist with ADL,iADL or mobility.  Patient is functioning very close to his baseline, and understands all of his precautions.  No further OT needs in the acute setting, and no post acute OT needed.  Recommend follow up with MD as prescribed.     Recommendations for follow up therapy are one component of a multi-disciplinary discharge planning process, led by the attending physician.  Recommendations may be updated based on patient status, additional functional criteria and insurance authorization.   Assistance Recommended at Discharge PRN  Patient can return home with the following Assist for transportation    Functional Status Assessment  Patient has not had a recent decline in their functional status  Equipment Recommendations  None recommended by OT    Recommendations for Other Services       Precautions / Restrictions Precautions Precautions: Cervical Precaution Booklet Issued: Yes (comment) Restrictions Weight Bearing Restrictions: No      Mobility Bed Mobility Overal bed mobility: Independent                  Transfers Overall transfer level: Independent                        Balance Overall balance assessment: Mild deficits observed, not formally tested                                         ADL either performed or assessed with clinical judgement   ADL Overall ADL's : At baseline                                             Vision Patient Visual Report: No change from baseline       Perception     Praxis      Pertinent Vitals/Pain  Pain Assessment Pain Assessment: No/denies pain     Hand Dominance Left   Extremity/Trunk Assessment Upper Extremity Assessment Upper Extremity Assessment: Overall WFL for tasks assessed   Lower Extremity Assessment Lower Extremity Assessment: Overall WFL for tasks assessed   Cervical / Trunk Assessment Cervical / Trunk Assessment: Neck Surgery   Communication Communication Communication: No difficulties   Cognition Arousal/Alertness: Awake/alert Behavior During Therapy: WFL for tasks assessed/performed Overall Cognitive Status: Within Functional Limits for tasks assessed                                       General Comments   VSS on RA    Exercises     Shoulder Instructions      Home Living Family/patient expects to be discharged to:: Private residence Living Arrangements: Spouse/significant other Available Help at Discharge: Family;Available 24 hours/day Type of Home: House Home Access: Stairs to enter [employer] of Steps: 3   Home Layout: One level     Bathroom  Shower/Tub: Tub/shower unit;Tub only   Teacher, early years/pre: Yes How Accessible: Accessible via walker Home Equipment: Shower seat - built in          Prior Functioning/Environment Prior Level of Function : Independent/Modified Independent;Driving                        OT Problem List: Pain      OT Treatment/Interventions:      OT Goals(Current goals can be found in the care plan section) Acute Rehab OT Goals Patient Stated Goal: Return home OT Goal Formulation: With patient Time For Goal Achievement: 01/17/23 Potential to Achieve Goals: Good  OT Frequency:      Co-evaluation              AM-PAC OT "6 Clicks" Daily Activity     Outcome Measure Help from another person eating meals?: None Help from another person taking care of personal grooming?: None Help from another person toileting, which includes using  toliet, bedpan, or urinal?: None Help from another person bathing (including washing, rinsing, drying)?: None Help from another person to put on and taking off regular upper body clothing?: None Help from another person to put on and taking off regular lower body clothing?: None 6 Click Score: 24   End of Session Equipment Utilized During Treatment: Gait belt Nurse Communication: Mobility status  Activity Tolerance: Patient tolerated treatment well Patient left: in bed;with call bell/phone within reach;with family/visitor present  OT Visit Diagnosis: Unsteadiness on feet (R26.81)                Time: 1610-9604 OT Time Calculation (min): 21 min Charges:  OT General Charges $OT Visit: 1 Visit OT Evaluation $OT Eval Moderate Complexity: 1 Mod  01/13/2023  RP, OTR/L  Acute Rehabilitation Services  Office:  [phone]   Suzanna Obey 01/13/2023, 3:18 PM
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
## 2023-01-13 NOTE — Brief Op Note (Signed)
01/13/2023  9:51 AM  PATIENT:  Seth Lewis  77 y.o. male  PRE-OPERATIVE DIAGNOSIS:  Stenosis  POST-OPERATIVE DIAGNOSIS:  Stenosis  PROCEDURE:  Procedure(s) with comments: Anterior Cervical Decompression Fusion - Cervical Three-Cervical Four (N/A) - 3C  SURGEON:  Surgeon(s) and Role:    * Julio Sicks, MD - Primary  PHYSICIAN ASSISTANT:   ASSISTANTSMarland Mcalpine   ANESTHESIA:   general  EBL:  50 mL   BLOOD ADMINISTERED:none  DRAINS: none   LOCAL MEDICATIONS USED:  NONE  SPECIMEN:  No Specimen  DISPOSITION OF SPECIMEN:  N/A  COUNTS:  YES  TOURNIQUET:  * No tourniquets in log *  DICTATION: .Dragon Dictation  PLAN OF CARE: Admit for overnight observation  PATIENT DISPOSITION:  PACU - hemodynamically stable.   Delay start of Pharmacological VTE agent (>24hrs) due to surgical blood loss or risk of bleeding: yes

## 2023-01-14 DIAGNOSIS — M5001 Cervical disc disorder with myelopathy,  high cervical region: Secondary | ICD-10-CM | POA: Diagnosis not present

## 2023-01-14 LAB — GLUCOSE, CAPILLARY: Glucose-Capillary: 179 mg/dL — ABNORMAL HIGH (ref 70–99)

## 2023-01-14 MED ORDER — HYDROCODONE-ACETAMINOPHEN 10-325 MG PO TABS: 1.0000 | ORAL_TABLET | ORAL | 0 refills | Status: AC | PRN

## 2023-01-14 MED ORDER — CYCLOBENZAPRINE HCL 10 MG PO TABS: 10.0000 mg | ORAL_TABLET | Freq: Three times a day (TID) | ORAL | 0 refills | Status: AC | PRN

## 2023-01-14 NOTE — Plan of Care (Signed)
Pt doing well. Pt given D/C instructions with verbal understanding. Rx's were sent to the pharmacy by MD. Pt's incision is clean and dry with no sign of infection. Pt's IV was removed prior to D/C. Pt D/C'd home via wheelchair per MD order. Pt is stable @ D/C and has no other needs at this time. Lakoda Raske, RN  

## 2023-01-14 NOTE — Discharge Summary (Signed)
Discharge Summary   Date of Admission: 01/13/23   Date of Discharge: 01/14/23   Attending Physician:  Birdie Hopes Course: Patient was admitted following an uncomplicated ACDF C3-4. They were recovered in PACU and transferred to [facility]. Their preop symptoms were improved. Pt to f/u in office in 2 weeks for post op visit. Pt is in agreement w/ plan.    Neurologic exam at discharge:  A&O x3 Strength 5/5 x4.  SILTx4.  Dressing c/d/I.      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

## 2023-01-16 MED FILL — Thrombin For Soln 5000 Unit: CUTANEOUS | Qty: 2 | Status: AC

## 2023-01-16 NOTE — Anesthesia Postprocedure Evaluation (Signed)
Anesthesia Post Note  Patient: Seth Lewis  Procedure(s) Performed: Anterior Cervical Decompression Fusion - Cervical Three-Cervical Four     Patient location during evaluation: PACU Anesthesia Type: General Level of consciousness: awake and alert Pain management: pain level controlled Vital Signs Assessment: post-procedure vital signs reviewed and stable Respiratory status: spontaneous breathing, nonlabored ventilation, respiratory function stable and patient connected to nasal cannula oxygen Cardiovascular status: blood pressure returned to baseline and stable Postop Assessment: no apparent nausea or vomiting Anesthetic complications: no   No notable events documented.  Last Vitals:  Vitals:   01/14/23 0440 01/14/23 0739  BP: 136/74 (!) 141/85  Pulse: 78 76  Resp: 18 20  Temp: 36.8 C 36.5 C  SpO2: 95% 97%    Last Pain:  Vitals:   01/14/23 0739  TempSrc: Oral  PainSc:                  [name]

## 2023-01-17 ENCOUNTER — Encounter (HOSPITAL_COMMUNITY): Payer: Self-pay | Admitting: Neurosurgery

## 2023-06-14 DIAGNOSIS — Z9049 Acquired absence of other specified parts of digestive tract: Secondary | ICD-10-CM | POA: Diagnosis not present

## 2023-06-14 DIAGNOSIS — F17290 Nicotine dependence, other tobacco product, uncomplicated: Secondary | ICD-10-CM | POA: Diagnosis not present

## 2023-06-14 DIAGNOSIS — E119 Type 2 diabetes mellitus without complications: Secondary | ICD-10-CM | POA: Diagnosis not present

## 2023-06-14 DIAGNOSIS — Z87442 Personal history of urinary calculi: Secondary | ICD-10-CM | POA: Diagnosis not present

## 2023-06-14 DIAGNOSIS — K760 Fatty (change of) liver, not elsewhere classified: Secondary | ICD-10-CM | POA: Diagnosis not present

## 2023-06-14 DIAGNOSIS — N3001 Acute cystitis with hematuria: Secondary | ICD-10-CM | POA: Diagnosis not present

## 2023-06-14 DIAGNOSIS — N281 Cyst of kidney, acquired: Secondary | ICD-10-CM | POA: Diagnosis not present

## 2023-08-02 DIAGNOSIS — E119 Type 2 diabetes mellitus without complications: Secondary | ICD-10-CM | POA: Diagnosis not present

## 2023-08-02 DIAGNOSIS — M50321 Other cervical disc degeneration at C4-C5 level: Secondary | ICD-10-CM | POA: Diagnosis not present

## 2023-08-02 DIAGNOSIS — S161XXA Strain of muscle, fascia and tendon at neck level, initial encounter: Secondary | ICD-10-CM | POA: Diagnosis not present

## 2023-08-02 DIAGNOSIS — S39012A Strain of muscle, fascia and tendon of lower back, initial encounter: Secondary | ICD-10-CM | POA: Diagnosis not present

## 2023-08-02 DIAGNOSIS — F17290 Nicotine dependence, other tobacco product, uncomplicated: Secondary | ICD-10-CM | POA: Diagnosis not present

## 2023-08-02 DIAGNOSIS — I10 Essential (primary) hypertension: Secondary | ICD-10-CM | POA: Diagnosis not present

## 2023-08-02 DIAGNOSIS — Z981 Arthrodesis status: Secondary | ICD-10-CM | POA: Diagnosis not present

## 2023-08-14 DIAGNOSIS — M542 Cervicalgia: Secondary | ICD-10-CM | POA: Diagnosis not present

## 2023-08-14 DIAGNOSIS — M25511 Pain in right shoulder: Secondary | ICD-10-CM | POA: Diagnosis not present

## 2023-08-21 DIAGNOSIS — M542 Cervicalgia: Secondary | ICD-10-CM | POA: Diagnosis not present

## 2023-08-28 DIAGNOSIS — M542 Cervicalgia: Secondary | ICD-10-CM | POA: Diagnosis not present

## 2024-01-28 DIAGNOSIS — I10 Essential (primary) hypertension: Secondary | ICD-10-CM | POA: Diagnosis not present

## 2024-01-28 DIAGNOSIS — G4733 Obstructive sleep apnea (adult) (pediatric): Secondary | ICD-10-CM | POA: Diagnosis not present

## 2024-01-28 DIAGNOSIS — E119 Type 2 diabetes mellitus without complications: Secondary | ICD-10-CM | POA: Diagnosis not present

## 2024-01-28 DIAGNOSIS — R079 Chest pain, unspecified: Secondary | ICD-10-CM | POA: Diagnosis not present

## 2024-01-28 DIAGNOSIS — R519 Headache, unspecified: Secondary | ICD-10-CM | POA: Diagnosis not present

## 2024-03-17 DIAGNOSIS — F17290 Nicotine dependence, other tobacco product, uncomplicated: Secondary | ICD-10-CM | POA: Diagnosis not present

## 2024-03-17 DIAGNOSIS — Z87891 Personal history of nicotine dependence: Secondary | ICD-10-CM | POA: Diagnosis not present

## 2024-03-17 DIAGNOSIS — R0789 Other chest pain: Secondary | ICD-10-CM | POA: Diagnosis not present

## 2024-03-17 DIAGNOSIS — E119 Type 2 diabetes mellitus without complications: Secondary | ICD-10-CM | POA: Diagnosis not present

## 2024-03-17 DIAGNOSIS — M25562 Pain in left knee: Secondary | ICD-10-CM | POA: Diagnosis not present

## 2024-03-17 DIAGNOSIS — I10 Essential (primary) hypertension: Secondary | ICD-10-CM | POA: Diagnosis not present

## 2024-03-17 DIAGNOSIS — G8911 Acute pain due to trauma: Secondary | ICD-10-CM | POA: Diagnosis not present

## 2024-03-17 DIAGNOSIS — W19XXXA Unspecified fall, initial encounter: Secondary | ICD-10-CM | POA: Diagnosis not present

## 2024-03-17 NOTE — ED Provider Notes (Signed)
 ------------------------------------------------------------------------------- Attestation signed by Seth Rollo Jewels, DO at 03/19/24 1913 Patient was seen by PA or NP and I was not involved in patient's work-up, care plan, treatment or disposition.  I was available for consultation at all points during patient's visit.   -------------------------------------------------------------------------------   [facility]  ED Provider Note  Seth Lewis 78 y.o. male DOB: 1946/02/11 MRN: [id number] History   Chief Complaint  Patient presents with  . Knee Pain    To ED with L knee pain after recurrent falls at home. Denies any other injuries.    78 year old male presents for evaluation of acute onset left knee pain after a fall at home last night.  No swelling.  No motor or sensory deficit.  He denies that he hit his head.  He did not lose consciousness.  He has a history of Parkinson's disease.  He denies confusion or change in mentation, lightheadedness, headache, chest pain or shortness of breath, abdominal pain, nausea, vomiting or diarrhea.   History provided by:  Patient Language interpreter used: No   Knee Pain Injury: yes   Time since incident:  1 day Mechanism of injury: fall   Fall:    Point of impact:  Knees   Entrapped after fall: no   Location:  Knee Knee location:  L knee Pain details:    Pain quality:  Aching   Pain radiates to:  Does not radiate   Duration:  1 day Chronicity:  New      Past Medical History:  Diagnosis Date  . Diabetes mellitus (*)   . Hearing loss   . Hypertension   . Nonpsychotic mental disorder   . Renal disorder   . Sleep apnea     History reviewed. No pertinent surgical history.  Social History   Substance and Sexual Activity  Alcohol Use Not Currently   Tobacco Use History[1] E-Cigarettes  . Vaping Use Never User   . Start Date    . Cartridges/Day    . Quit Date     Social  History   Substance and Sexual Activity  Drug Use Never         Allergies[2]  Discharge Medication List as of 03/17/2024  8:37 PM     CONTINUE these medications which have NOT CHANGED   Details  predniSONE (DELTASONE) 10 mg tablet Take 6, 5, 4, 3, 2, 1 daily until finish, Normal        Primary Survey   Exposure    No visible abdominal trauma.       Review of Systems   Review of Systems Refer to HPI for review of systems. Unless otherwise noted, the patient's 5-point ROS is negative. Physical Exam   ED Triage Vitals [03/17/24 1637]  BP 100/61  Heart Rate 55  Resp 16  SpO2 100 %  Temp 97.5 F (36.4 C)    Physical Exam  Nursing note and vitals reviewed. Constitutional: He appears well-developed and well-nourished. He does not appear distressed, does not appear ill and no respiratory distress.  HENT:  Head: Normocephalic and atraumatic.  Right Ear: Normal external ear.  Left Ear: Normal external ear.  Nose: Nose normal.  Eyes: EOM are intact. Conjunctivae are normal. Pupils are equal, round, and reactive to light.  Neck: Normal range of motion. Neck supple.  Cardiovascular: Normal rate, regular rhythm and normal heart sounds.  Pulmonary/Chest: No respiratory distress. Good air movement. Respiratory effort normal and breath sounds normal.  Abdominal: No visible  abdominal trauma.  Musculoskeletal: Normal range of motion.     Left knee: No swelling, deformity, effusion, erythema or ecchymosis. Normal range of motion. Tenderness (mild, diffuse) present.     Cervical back: Normal range of motion and neck supple.       Legs:   Neurological: He is alert and oriented to person, place, and time.  Skin: Skin is warm. Skin is dry.  Psychiatric: He has a normal mood and affect. His behavior is normal. Judgment and thought content normal.     ED Course   Lab results: No data to display  Imaging:   XR KNEE 3 VIEWS LEFT   Narrative:    INDICATION:  Injury  TECHNIQUE: XR KNEE 3 VIEWS LEFT. Comparison none.  FINDINGS: No fracture, dislocation, or radiopaque foreign body.    Impression:    IMPRESSION: No acute bony abnormality  Electronically Signed by: Glendia Guillaume, MD on 03/17/2024 5:17 PM      ECG: ECG Results   None                                                                     Pre-Sedation Procedures    Medical Decision Making DDx considerations include but are not limited to extremity fracture or dislocation, significant ligamentous or tendinous injury low suspicion for head injury or loss of consciousness.  Unremarkable ED course with vital signs are stable and within expected ranges.  Patient A&Ox3 throughout his ED visit.  Plain film x-rays of the knee with no acute fracture or dislocation.  XR Knee 3 Views Left  Final Result   IMPRESSION: No acute bony abnormality      Electronically Signed by: Glendia Guillaume, MD on 03/17/2024 5:17 PM   Plan: Patient will discharge home for [facility] follow-up.  Strict return precautions were discussed with patient who understands that he should return to the ER for worsening symptoms or other urgent or emergent medical concerns.  Amount and/or Complexity of Data Reviewed Radiology: ordered. Decision-making details documented in ED Course.           Provider Communication  Discharge Medication List as of 03/17/2024  8:37 PM      Discharge Medication List as of 03/17/2024  8:37 PM      Discharge Medication List as of 03/17/2024  8:37 PM      Clinical Impression Final diagnoses:  Fall, initial encounter  Acute pain of left knee    ED Disposition     ED Disposition  Discharge   Condition  Stable   Comment  --                 Follow-up Information     Seth Angus, PA. Schedule an appointment as soon as possible for a visit in 3 days.   Specialty: Physician Assistant Contact information: [address] [place] [address] [phone]                  Electronically signed by:      [1] Social History Tobacco Use  Smoking Status Light Smoker  . Types: Cigars  Smokeless Tobacco Never  [2] No Known Allergies  Seth Sherwood Banks, PA-C 03/18/24 1610

## 2024-05-09 NOTE — Progress Notes (Signed)
 [facility] [place] NEW PATIENT EVALUATION   Referring Physician:  No ref. provider found Primary Care Physician:  No primary care provider on file.   Patient ID:  Seth Lewis is a 78 y.o. (DOB [birth year]) male.    Subjective   Seth Lewis is a 78 y.o. male referred for evaluation. History of Present Illness The patient presents for evaluation of tremors.  Approximately 1-2 years ago, he was diagnosed with Parkinson's disease through the [place] and has been taking carbidopa -levodopa  25-100 mg three times daily (taking at 8 AM, 4 PM and then bedtime) for about two years. The medication was prescribed due to observed hand and foot tremors, mostly on the left side. He occasionally experiences mild hand tremors during meals, but these are not frequent. The tremors are more noticeable when he is at rest, such as when watching TV, and they tend to subside after a few instances. He reports no pain associated with the tremors and is unaware of any factors that might exacerbate them. He has not noticed any significant changes since starting the medication and believes it has been some time since he last experienced a tremor.  SOCIAL HISTORY:   Hobbies: Golf    Past Medical History, Past Surgery History, Social History, and Family History were reviewed and updated.    Past Medical History:  Diagnosis Date  . Diabetes mellitus (*)   . Hearing loss   . Hypertension   . Nonpsychotic mental disorder   . Renal disorder   . Sleep apnea     No past surgical history on file. No family history on file. Social History[1]    Current Home Medications  Medication Sig  aspirin  (ASPRI-LOW,ECOTRIN LOW DOSE) 81 mg EC tablet Take one tablet (81 mg dose) by mouth daily.  atorvastatin  (LIPITOR) 80 mg tablet Take one tablet (80 mg dose) by mouth daily.  empagliflozin  (JARDIANCE ) 25 mg TABS tablet Take one half tablet (12.5 mg dose) by mouth daily. FOR DIABETES   Magnesium  250 MG TABS Take by mouth.  metoprolol  tartrate (LOPRESSOR ) 50 mg tablet Take one tablet (50 mg dose) by mouth 2 (two) times daily.  mirtazapine  (REMERON ) 15 mg tablet Take one tablet (15 mg dose) by mouth at bedtime.  Multiple Vitamin (MULTIVITAMIN ADULT PO) 1 gummies Orally Once a day  NIFEdipine  (ADALAT  CC) 30 mg 24 hr tablet Take one tablet (30 mg dose) by mouth daily.  pantoprazole  sodium (PROTONIX ) 40 mg tablet Take one tablet (40 mg dose) by mouth daily.  pioglitazone (ACTOS) 15 MG tablet Take one tablet (15 mg dose) by mouth daily.  prazosin  HCl (MINIPRESS ) 5 mg capsule Take one capsule (5 mg dose) by mouth at bedtime.  SITagliptin 50 MG TABS Take 50 mg by mouth daily.  venlafaxine  HCl (EFFEXOR ) 75 mg tablet Take one tablet (75 mg dose) by mouth 2 (two) times daily.    The patient is allergic to metformin .  Review of Systems is complete and negative except as noted in History of Present Illness.  Objective    PHYSICAL EXAM: BP 102/60 (BP Location: Right Upper Arm, Patient Position: Sitting)   Pulse 54   Resp 18   Ht 6' 1 (1.854 m)   Wt 202 lb (91.6 kg)   SpO2 92%   BMI 26.65 kg/m  GENERAL:  No acute distress.  EYES:   Pupils: pupils equally round, reactive ENT:   Throat: oropharynx clear.  SKIN:   Inspection: well perfused, no edema.  MENTAL STATUS EXAM: Orientation: Alert and oriented to person, place and time. Memory: Cooperative, follows commands well. Recent and remote memory normal.. Attention, concentration: Attention span and concentration are normal. Language: Speech is clear and language is normal. Fund of knowledge: Aware of current events, vocabulary appropriate for patient age.   CRANIAL NERVES: CN 2 (Optic): Visual fields intact to confrontation CN 3,4,6 (EOM): Pupils equal and reactive. Full extraocular eye movement without nystagmus. CN 5 (Trigeminal): Facial sensation is normal, no weakness of masticatory muscles. CN 7 (Facial): No  facial weakness or asymmetry. CN 8 (Auditory): Auditory acuity grossly normal. CN 9,10 (Glossophar): The uvula is midline CN 11 (spinal access): Normal sternocleidomastoid and trapezius strength. CN 12 (Hypoglossal): The tongue is midline.    MOTOR: Muscle Strength: Strength - 5/5 in the upper and lower extremities, no pronation or drift. Muscle Tone: Tone and muscle bulk are normal in the upper and lower extremities.   REFLEXES:   DTRs - 1+ and symmetrical in all four extremities  COORDINATION:   Intact finger-to-nose with slight tremor at endpoint, no resting/pill-rolling tremor  SENSATION:  Intact to light touch  GAIT: Walks with walker, no shuffling   DIAGNOSTIC STUDIES:   MRI C-spine 08/10/2023 There is multilevel mild neural foraminal narrowing without significant neural sac compression. No marrow signal abnormality is seen.   MRI Brain 02/17/2024 No acute intracranial process. Similar chronic age related changes  LABORATORY STUDIES:   Lab Results  Component Value Date   Glucose, POC 115 (H) 01/28/2024   Total Protein 7.8 01/28/2024   ALBUMIN/GLOBULIN RATIO 0.8 (L) 01/28/2024   CRP 42.40 (H) 01/28/2024   ESR 36 (H) 01/28/2024   Mg 2.0 01/28/2024      Assessment / Plan   Assessment & Plan 1. Tremors. The patient's symptoms do not strongly indicate Parkinson's disease at this time.  I really do not appreciate a resting tremor and was no significant bradykinesia or cogwheel rigidity.  The tremors could be due to other causes such as pinched nerves, tremor or medication side effects. A trial discontinuation of carbidopa -levodopa  25-100 mg is recommended to observe any changes in his condition. He is advised to contact the office in a few weeks to report on his progress. If there is no significant change, the medication will be permanently discontinued. If symptoms worsen, the medication will be resumed.    Risks, benefits, and alternatives of the medications and  treatment plan prescribed today were discussed, and patient expressed understanding and agreement with the plan.  All new prescription medications and changes in current prescription dosages were discussed with the patient, including patient education, medication name, use, dosage, potential side effects, drug interactions, consequences of not using/taking and special instructions. Patient expressed understanding. No barriers to adherence.  Follow up in about 6 months (around 11/09/2024).  Medford Blumenthal, MD 05/09/2024, 10:50 AM  Computer technology was used to create this visit note.  Consent for patient/caregiver was given obtained prior to its use.  *Additional portions of this note was dictated with voice recognition software. Inadvertently, similar sounding words can, sometimes, get transcribed incorrectly      [1] Social History Socioeconomic History  . Marital status: Married  Tobacco Use  . Smoking status: Light Smoker    Types: Cigars  . Smokeless tobacco: Never  . Tobacco comments:    Occasional   Vaping Use  . Vaping status: Never Used  Substance and Sexual Activity  . Alcohol use: Not Currently  . Drug use: Never

## 2024-07-08 NOTE — Progress Notes (Addendum)
 Seth Lewis                                          MRN: [id number]   08/28/2024   The [facility] Quality Team Specialist reviewed this patient medical record for the purposes of chart review for care gap closure. The following were reviewed: chart review for care gap closure-kidney health evaluation for diabetes:eGFR  and uACR.    [facility] Quality Team
